# Patient Record
Sex: Female | Born: 1960
Health system: Southern US, Community
[De-identification: ages and names within clinical notes are randomized; demographics above are authoritative.]

## PROBLEM LIST (undated history)

## (undated) DIAGNOSIS — F329 Major depressive disorder, single episode, unspecified: Secondary | ICD-10-CM

## (undated) DIAGNOSIS — F32A Depression, unspecified: Secondary | ICD-10-CM

## (undated) DIAGNOSIS — Z9889 Other specified postprocedural states: Secondary | ICD-10-CM

## (undated) DIAGNOSIS — E785 Hyperlipidemia, unspecified: Secondary | ICD-10-CM

## (undated) DIAGNOSIS — M1990 Unspecified osteoarthritis, unspecified site: Secondary | ICD-10-CM

## (undated) DIAGNOSIS — R7303 Prediabetes: Secondary | ICD-10-CM

## (undated) DIAGNOSIS — F419 Anxiety disorder, unspecified: Secondary | ICD-10-CM

## (undated) DIAGNOSIS — R112 Nausea with vomiting, unspecified: Secondary | ICD-10-CM

## (undated) HISTORY — DX: Major depressive disorder, single episode, unspecified: F32.9

## (undated) HISTORY — DX: Depression, unspecified: F32.A

## (undated) HISTORY — DX: Anxiety disorder, unspecified: F41.9

---

## 1993-02-06 HISTORY — PX: TUBAL LIGATION: SHX77

## 2014-10-06 DIAGNOSIS — F329 Major depressive disorder, single episode, unspecified: Secondary | ICD-10-CM | POA: Insufficient documentation

## 2014-10-06 DIAGNOSIS — F32A Depression, unspecified: Secondary | ICD-10-CM | POA: Insufficient documentation

## 2014-10-06 DIAGNOSIS — F419 Anxiety disorder, unspecified: Secondary | ICD-10-CM | POA: Insufficient documentation

## 2014-10-08 ENCOUNTER — Encounter: Payer: Self-pay | Admitting: Family Medicine

## 2014-10-08 ENCOUNTER — Ambulatory Visit (INDEPENDENT_AMBULATORY_CARE_PROVIDER_SITE_OTHER): Payer: BLUE CROSS/BLUE SHIELD | Admitting: Family Medicine

## 2014-10-08 VITALS — BP 119/78 | HR 62 | Temp 97.7°F | Ht 63.0 in | Wt 215.0 lb

## 2014-10-08 DIAGNOSIS — F32A Depression, unspecified: Secondary | ICD-10-CM

## 2014-10-08 DIAGNOSIS — F329 Major depressive disorder, single episode, unspecified: Secondary | ICD-10-CM | POA: Diagnosis not present

## 2014-10-08 MED ORDER — VENLAFAXINE HCL 75 MG PO TABS: 75.0000 mg | ORAL_TABLET | Freq: Two times a day (BID) | ORAL | Status: DC

## 2014-10-08 NOTE — Assessment & Plan Note (Signed)
The current medical regimen is effective;  continue present plan and medications.  

## 2014-10-08 NOTE — Progress Notes (Signed)
   BP 119/78 mmHg  Pulse 62  Temp(Src) 97.7 F (36.5 C)  Ht  (1.6 m)  Wt 215 lb (97.523 kg)  BMI 38.09 kg/m2  SpO2 96%   Subjective:    Patient ID: Jill Burch, female    DOB: 10/13/1960, 54 y.o.   MRN: [id number]  HPI: Jill Burch is a 54 y.o. female  Chief Complaint  Patient presents with  . Depression   patient follow-up doing well no complaints with medications.  not having any side effects takes faithfully 2 a day.  Relevant past medical, surgical, family and social history reviewed and updated as indicated. Interim medical history since our last visit reviewed. Allergies and medications reviewed and updated.  Review of Systems  Constitutional: Negative.   Respiratory: Negative.   Cardiovascular: Negative.     Per HPI unless specifically indicated above     Objective:    BP 119/78 mmHg  Pulse 62  Temp(Src) 97.7 F (36.5 C)  Ht  (1.6 m)  Wt 215 lb (97.523 kg)  BMI 38.09 kg/m2  SpO2 96%  Wt Readings from Last 3 Encounters:  10/08/14 215 lb (97.523 kg)  05/07/14 204 lb (92.534 kg)    Physical Exam  Constitutional: She is oriented to person, place, and time. She appears well-developed and well-nourished. No distress.  HENT:  Head: Normocephalic and atraumatic.  Right Ear: Hearing normal.  Left Ear: Hearing normal.  Nose: Nose normal.  Eyes: Conjunctivae and lids are normal. Right eye exhibits no discharge. Left eye exhibits no discharge. No scleral icterus.  Cardiovascular: Normal rate, regular rhythm and normal heart sounds.   Pulmonary/Chest: Effort normal and breath sounds normal. No respiratory distress.  Musculoskeletal: Normal range of motion.  Neurological: She is alert and oriented to person, place, and time.  Skin: Skin is intact. No rash noted.  Psychiatric: She has a normal mood and affect. Her speech is normal and behavior is normal. Judgment and thought content normal. Cognition and memory are normal.    No results found for this or  any previous visit.    Assessment & Plan:   Problem List Items Addressed This Visit      Other   Depression - Primary    The current medical regimen is effective;  continue present plan and medications.       Relevant Medications   venlafaxine (EFFEXOR) 75 MG tablet       Follow up plan: Return in about 6 months (around 04/07/2015), or if symptoms worsen or fail to improve, for Physical Exam.

## 2015-03-30 ENCOUNTER — Encounter: Payer: Self-pay | Admitting: Family Medicine

## 2015-05-13 ENCOUNTER — Encounter: Payer: BLUE CROSS/BLUE SHIELD | Admitting: Family Medicine

## 2015-07-08 ENCOUNTER — Encounter: Payer: Self-pay | Admitting: Family Medicine

## 2015-07-08 ENCOUNTER — Telehealth: Payer: Self-pay | Admitting: Family Medicine

## 2015-07-08 NOTE — Telephone Encounter (Signed)
Pt called to cancel CPE @ 10am this morning.  I advised her no medications could be refilled until she had an appointment.  She refused appts I offered due to time constraints and scheduled for August.  I again let her know she may not get any refills until that date.

## 2015-08-08 ENCOUNTER — Encounter: Payer: Self-pay | Admitting: Emergency Medicine

## 2015-08-08 ENCOUNTER — Emergency Department: Admission: EM | Admit: 2015-08-08 | Discharge: 2015-08-08 | Discharge status: Absent without leave | Payer: Self-pay

## 2015-08-08 ENCOUNTER — Emergency Department
Admission: EM | Admit: 2015-08-08 | Discharge: 2015-08-08 | Disposition: A | Discharge status: Home / Self Care | Payer: 59 | Attending: Emergency Medicine | Admitting: Emergency Medicine

## 2015-08-08 ENCOUNTER — Emergency Department: Payer: 59

## 2015-08-08 DIAGNOSIS — F329 Major depressive disorder, single episode, unspecified: Secondary | ICD-10-CM | POA: Diagnosis not present

## 2015-08-08 DIAGNOSIS — M93262 Osteochondritis dissecans, left knee: Secondary | ICD-10-CM | POA: Insufficient documentation

## 2015-08-08 DIAGNOSIS — Z760 Encounter for issue of repeat prescription: Secondary | ICD-10-CM | POA: Diagnosis present

## 2015-08-08 DIAGNOSIS — R52 Pain, unspecified: Secondary | ICD-10-CM

## 2015-08-08 DIAGNOSIS — Z87891 Personal history of nicotine dependence: Secondary | ICD-10-CM | POA: Diagnosis not present

## 2015-08-08 MED ORDER — HYDROCODONE-ACETAMINOPHEN 5-325 MG PO TABS: 1.0000 | ORAL_TABLET | ORAL | Status: DC | PRN

## 2015-08-08 MED ORDER — VENLAFAXINE HCL ER 75 MG PO CP24: 150.0000 mg | ORAL_CAPSULE | Freq: Every day | ORAL | Status: DC

## 2015-08-08 MED ORDER — MELOXICAM 15 MG PO TABS: 15.0000 mg | ORAL_TABLET | Freq: Every day | ORAL | Status: DC

## 2015-08-08 NOTE — ED Provider Notes (Signed)
[facility] Emergency Department Provider Note ____________________________________________  Time seen: Approximately 3:09 PM  I have reviewed the triage vital signs and the nursing notes.   HISTORY  Chief Complaint Medication Refill    HPI Jill Burch is a 55 y.o. female who presents to the emergency department for evaluation of left knee pain and for medication refill. Knee has been hurting for about a week without specific injury. She states she feels like it "wants to pop or give out." She has not taken anything for pain. Pain is worse while standing/walking.  Past Medical History  Diagnosis Date  . Anxiety   . Depression     Patient Active Problem List   Diagnosis Date Noted  . Anxiety   . Depression     Past Surgical History  Procedure Laterality Date  . Tubal ligation      Current Outpatient Rx  Name  Route  Sig  Dispense  Refill  . HYDROcodone-acetaminophen (NORCO/VICODIN) 5-325 MG tablet   Oral   Take 1 tablet by mouth every 4 (four) hours as needed.   12 tablet   0   . meloxicam (MOBIC) 15 MG tablet   Oral   Take 1 tablet (15 mg total) by mouth daily.   30 tablet   0   . venlafaxine XR (EFFEXOR XR) 75 MG 24 hr capsule   Oral   Take 2 capsules (150 mg total) by mouth daily.   30 capsule   0     Allergies Review of patient's allergies indicates no known allergies.  Family History  Problem Relation Age of Onset  . Depression Mother   . Arthritis Mother   . Cancer Father   . Diabetes Father   . Depression Father     Social History Social History  Substance Use Topics  . Smoking status: Former Smoker    Types: Cigarettes    Quit date: 02/06/1990  . Smokeless tobacco: Never Used  . Alcohol Use: No    Review of Systems Constitutional: No recent illness. Cardiovascular: Denies chest pain or palpitations. Respiratory: Denies shortness of breath. Musculoskeletal: Pain in Left knee Skin: Negative for rash, wound,  lesion. Neurological: Negative for focal weakness or numbness.  ____________________________________________   PHYSICAL EXAM:  VITAL SIGNS: ED Triage Vitals  Enc Vitals Group     BP 08/08/15 1348 119/74 mmHg     Pulse Rate 08/08/15 1348 71     Resp 08/08/15 1348 16     Temp 08/08/15 1348 97.8 F (36.6 C)     Temp Source 08/08/15 1348 Oral     SpO2 08/08/15 1348 97 %     Weight 08/08/15 1348 235 lb (106.595 kg)     Height 08/08/15 1348 5\' 3"  (1.6 m)     Head Cir --      Peak Flow --      Pain Score 08/08/15 1346 0     Pain Loc --      Pain Edu? --      Excl. in GC? --     Constitutional: Alert and oriented. Well appearing and in no acute distress. Eyes: Conjunctivae are normal. EOMI. Head: Atraumatic. Neck: No stridor.  Respiratory: Normal respiratory effort.   Musculoskeletal: Diffuse tenderness over the anterior aspect of the left knee. No focal tenderness on exam. Ambulation without assistance or limp observed. Neurologic:  Normal speech and language. No gross focal neurologic deficits are appreciated. Speech is normal. No gait instability. Skin:  Skin  is warm, dry and intact. Atraumatic. Psychiatric: Mood and affect are normal. Speech and behavior are normal.  ____________________________________________   LABS (all labs ordered are listed, but only abnormal results are displayed)  Labs Reviewed - No data to display ____________________________________________  RADIOLOGY  Under surface patellar osteochondral lesion. Moderate patellofemoral and medial compartment osteoarthritis. ____________________________________________   PROCEDURES  Procedure(s) performed:  Knee immobilizer applied by ER tech. Neurovascularly intact post-application.   ____________________________________________   INITIAL IMPRESSION / ASSESSMENT AND PLAN / ED COURSE  Pertinent labs & imaging results that were available during my care of the patient were reviewed by me and  considered in my medical decision making (see chart for details).  Patient was advised to call and schedule a follow-up appointment with orthopedics. She was given a refill of her Effexor as well as prescriptions for meloxicam 15 mg and Norco 5-3 25 mg. She was advised to return to the emergency department for symptoms that change or worsen if she is unable to schedule an appointment with her primary care provider or at the orthopedist. ____________________________________________   FINAL CLINICAL IMPRESSION(S) / ED DIAGNOSES  Final diagnoses:  Encounter for medication refill  Osteochondritis dissecans of knee, left       Chinita PesterCari B Finlee Milo, FNP 08/08/15 1924  Sharyn CreamerMark Quale, MD 08/08/15 2358

## 2015-08-08 NOTE — ED Notes (Signed)
Pt is out of refills of her antidepressant Venlafaxine HCL 75mg  tablet. Pt missed one appt and provider missed an appt and now patient is out of her medication which she states "keeps me calm" Pt took last tablet this morning and does not have enough medications for a second dose.  Next appt with provider is 8/3.

## 2015-08-08 NOTE — ED Notes (Signed)
Pt verbalized understanding of discharge instructions. NAD at this time. 

## 2015-08-08 NOTE — ED Notes (Signed)
Patient states she is out of her medication, Venlafaxine HCL 75 mg BID.   Medication last taken today.  Patient has appointment with PCP on 09/09/2015.

## 2015-09-09 ENCOUNTER — Ambulatory Visit (INDEPENDENT_AMBULATORY_CARE_PROVIDER_SITE_OTHER): Payer: 59 | Admitting: Family Medicine

## 2015-09-09 ENCOUNTER — Encounter: Payer: Self-pay | Admitting: Family Medicine

## 2015-09-09 DIAGNOSIS — F329 Major depressive disorder, single episode, unspecified: Secondary | ICD-10-CM | POA: Diagnosis not present

## 2015-09-09 DIAGNOSIS — F32A Depression, unspecified: Secondary | ICD-10-CM

## 2015-09-09 MED ORDER — VENLAFAXINE HCL ER 150 MG PO CP24: 150.0000 mg | ORAL_CAPSULE | Freq: Every day | ORAL | 3 refills | Status: DC

## 2015-09-09 MED ORDER — VENLAFAXINE HCL ER 75 MG PO CP24: 150.0000 mg | ORAL_CAPSULE | Freq: Every day | ORAL | 6 refills | Status: DC

## 2015-09-09 MED ORDER — MELOXICAM 15 MG PO TABS: 15.0000 mg | ORAL_TABLET | Freq: Every day | ORAL | 3 refills | Status: DC

## 2015-09-09 NOTE — Assessment & Plan Note (Signed)
Impression exacerbation off medications will restart gave refills for a year

## 2015-09-09 NOTE — Progress Notes (Signed)
   BP (!) 143/86 (BP Location: Left Arm, Patient Position: Sitting, Cuff Size: Normal)   Pulse 68   Temp 97.9 F (36.6 C)   Ht 5' 2.1" (1.577 m)   Wt 229 lb (103.9 kg)   BMI 41.75 kg/m    Subjective:    Patient ID: Jill Burch, female    DOB: 1960-04-22, 55 y.o.   MRN: [id number]  HPI: Jill Burch is a 55 y.o. female  Chief Complaint  Patient presents with  . Anxiety  Patient has run out of her Effexor needs refills very concerned. When on Effexor does well with good control of her nerves.  Patient also had her ribs bruise by her dog 3 days ago now's sore but getting somewhat better no bruising  Relevant past medical, surgical, family and social history reviewed and updated as indicated. Interim medical history since our last visit reviewed. Allergies and medications reviewed and updated.  Review of Systems  Constitutional: Negative.   Respiratory: Negative.   Cardiovascular: Negative.     Per HPI unless specifically indicated above     Objective:    BP (!) 143/86 (BP Location: Left Arm, Patient Position: Sitting, Cuff Size: Normal)   Pulse 68   Temp 97.9 F (36.6 C)   Ht 5' 2.1" (1.577 m)   Wt 229 lb (103.9 kg)   BMI 41.75 kg/m   Wt Readings from Last 3 Encounters:  09/09/15 229 lb (103.9 kg)  08/08/15 235 lb (106.6 kg)  10/08/14 215 lb (97.5 kg)    Physical Exam  Constitutional: She is oriented to person, place, and time. She appears well-developed and well-nourished. No distress.  HENT:  Head: Normocephalic and atraumatic.  Right Ear: Hearing normal.  Left Ear: Hearing normal.  Nose: Nose normal.  Eyes: Conjunctivae and lids are normal. Right eye exhibits no discharge. Left eye exhibits no discharge. No scleral icterus.  Cardiovascular: Normal rate, regular rhythm and normal heart sounds.   Pulmonary/Chest: Effort normal and breath sounds normal. No respiratory distress.  Musculoskeletal: Normal range of motion.  Rib and lung area clear just some  superficial tenderness  Neurological: She is alert and oriented to person, place, and time.  Skin: Skin is intact. No rash noted.  Psychiatric: She has a normal mood and affect. Her speech is normal and behavior is normal. Judgment and thought content normal. Cognition and memory are normal.    No results found for this or any previous visit.    Assessment & Plan:   Problem List Items Addressed This Visit      Other   Depression    Impression exacerbation off medications will restart gave refills for a year      Relevant Medications   venlafaxine XR (EFFEXOR-XR) 150 MG 24 hr capsule    Other Visit Diagnoses   None.     Rib abdomen contusion will observe Gave refills meloxicam for knee arthritis Follow up plan: Return for Physical Exam with Roosvelt Maser.

## 2015-10-14 ENCOUNTER — Encounter: Payer: 59 | Admitting: Family Medicine

## 2015-10-15 ENCOUNTER — Encounter: Payer: 59 | Admitting: Family Medicine

## 2015-10-29 ENCOUNTER — Encounter: Payer: 59 | Admitting: Family Medicine

## 2015-11-03 ENCOUNTER — Encounter: Payer: Self-pay | Admitting: Family Medicine

## 2015-11-03 ENCOUNTER — Ambulatory Visit (INDEPENDENT_AMBULATORY_CARE_PROVIDER_SITE_OTHER): Payer: 59 | Admitting: Family Medicine

## 2015-11-03 VITALS — BP 124/76 | HR 71 | Temp 98.1°F | Ht 62.5 in | Wt 229.0 lb

## 2015-11-03 DIAGNOSIS — Z1329 Encounter for screening for other suspected endocrine disorder: Secondary | ICD-10-CM

## 2015-11-03 DIAGNOSIS — F329 Major depressive disorder, single episode, unspecified: Secondary | ICD-10-CM

## 2015-11-03 DIAGNOSIS — F419 Anxiety disorder, unspecified: Secondary | ICD-10-CM | POA: Diagnosis not present

## 2015-11-03 DIAGNOSIS — Z131 Encounter for screening for diabetes mellitus: Secondary | ICD-10-CM | POA: Diagnosis not present

## 2015-11-03 DIAGNOSIS — Z13220 Encounter for screening for lipoid disorders: Secondary | ICD-10-CM | POA: Diagnosis not present

## 2015-11-03 DIAGNOSIS — Z0000 Encounter for general adult medical examination without abnormal findings: Secondary | ICD-10-CM

## 2015-11-03 DIAGNOSIS — F32A Depression, unspecified: Secondary | ICD-10-CM

## 2015-11-03 LAB — UA/M W/RFLX CULTURE, ROUTINE
Bilirubin, UA: NEGATIVE
GLUCOSE, UA: NEGATIVE
Ketones, UA: NEGATIVE
Leukocytes, UA: NEGATIVE
Nitrite, UA: NEGATIVE
PH UA: 7 (ref 5.0–7.5)
PROTEIN UA: NEGATIVE
RBC, UA: NEGATIVE
Specific Gravity, UA: 1.015 (ref 1.005–1.030)
Urobilinogen, Ur: 0.2 mg/dL (ref 0.2–1.0)

## 2015-11-03 MED ORDER — VENLAFAXINE HCL ER 150 MG PO CP24: 150.0000 mg | ORAL_CAPSULE | Freq: Every day | ORAL | 11 refills | Status: DC

## 2015-11-03 MED ORDER — MELOXICAM 15 MG PO TABS: 15.0000 mg | ORAL_TABLET | Freq: Every day | ORAL | 11 refills | Status: DC

## 2015-11-03 NOTE — Progress Notes (Signed)
BP 124/76   Pulse 71   Temp 98.1 F (36.7 C)   Ht 5' 2.5" (1.588 m)   Wt 229 lb (103.9 kg)   SpO2 95%   BMI 41.22 kg/m    Subjective:    Patient ID: Jill Burch, female    DOB: 02/25/1960, 55 y.o.   MRN: [id number]  HPI: Jill Burch is a 55 y.o. female presenting on 11/03/2015 for comprehensive medical examination. Current medical complaints include:none  She currently lives with: Menopausal Symptoms: yes, taking the [employer] version of natural supplement for hot flashes (unsure of name) with some relief.   Depression Screen done today and results listed below:  [facility] 11/03/2015 09/09/2015  Decreased Interest 0 2  Down, Depressed, Hopeless 0 3  PHQ - 2 Score 0 5  Altered sleeping - 3  Tired, decreased energy - 3  Change in appetite - 3  Feeling bad or failure about yourself  - 2  Trouble concentrating - 2  Moving slowly or fidgety/restless - 0  Suicidal thoughts - 0  PHQ-9 Score - 18    The patient does not have a history of falls. I did not complete a risk assessment for falls. A plan of care for falls was not documented.   Past Medical History:  Past Medical History:  Diagnosis Date  . Anxiety   . Depression     Surgical History:  Past Surgical History:  Procedure Laterality Date  . TUBAL LIGATION      Medications:  No current outpatient prescriptions on file prior to visit.   No current facility-administered medications on file prior to visit.     Allergies:  No Known Allergies  Social History:  Social History   Social History  . Marital status: Married    Spouse name: N/A  . Number of children: N/A  . Years of education: N/A   Occupational History  . Not on file.   Social History Main Topics  . Smoking status: Former Smoker    Types: Cigarettes    Quit date: 02/06/1990  . Smokeless tobacco: Never Used  . Alcohol use No  . Drug use: No  . Sexual activity: Not on file   Other Topics Concern  . Not on file   Social History  Narrative  . No narrative on file   History  Smoking Status  . Former Smoker  . Types: Cigarettes  . Quit date: 02/06/1990  Smokeless Tobacco  . Never Used   History  Alcohol Use No    Family History:  Family History  Problem Relation Age of Onset  . Depression Mother   . Arthritis Mother   . Heart disease Mother   . Heart attack Mother   . Cancer Father     bladder  . Diabetes Father   . Depression Father   . Hypertension Father   . Diabetes Brother   . COPD Neg Hx   . Stroke Neg Hx     Past medical history, surgical history, medications, allergies, family history and social history reviewed with patient today and changes made to appropriate areas of the chart.   Review of Systems - General ROS: negative Psychological ROS: negative ENT ROS: negative Endocrine ROS: negative Breast ROS: negative for breast lumps Respiratory ROS: no cough, shortness of breath, or wheezing Cardiovascular ROS: no chest pain or dyspnea on exertion Gastrointestinal ROS: no abdominal pain, change in bowel habits, or black or bloody stools Genito-Urinary ROS: no dysuria, trouble  voiding, or hematuria Musculoskeletal ROS: positive for - knee pain, chronic Neurological ROS: no TIA or stroke symptoms Dermatological ROS: negative All other ROS negative except what is listed above and in the HPI.      Objective:    BP 124/76   Pulse 71   Temp 98.1 F (36.7 C)   Ht 5' 2.5" (1.588 m)   Wt 229 lb (103.9 kg)   SpO2 95%   BMI 41.22 kg/m   Wt Readings from Last 3 Encounters:  11/03/15 229 lb (103.9 kg)  09/09/15 229 lb (103.9 kg)  08/08/15 235 lb (106.6 kg)    Physical Exam  Constitutional: She is oriented to person, place, and time. She appears well-developed and well-nourished. No distress.  HENT:  Head: Atraumatic.  Right Ear: External ear normal.  Left Ear: External ear normal.  Nose: Nose normal.  Mouth/Throat: Oropharynx is clear and moist. No oropharyngeal exudate.  Eyes:  Conjunctivae are normal. Pupils are equal, round, and reactive to light. No scleral icterus.  Neck: Normal range of motion. Neck supple. No tracheal deviation present. No thyromegaly present.  Cardiovascular: Normal rate, regular rhythm and normal heart sounds.   Pulmonary/Chest: Effort normal and breath sounds normal. No respiratory distress.  Abdominal: Soft. Bowel sounds are normal. She exhibits no distension. There is no tenderness.  Musculoskeletal: Normal range of motion. She exhibits no edema.  Lymphadenopathy:    She has no cervical adenopathy.  Neurological: She is alert and oriented to person, place, and time.  Skin: Skin is warm and dry. No rash noted.  Psychiatric: She has a normal mood and affect. Her behavior is normal.  Nursing note and vitals reviewed.   Results for orders placed or performed in visit on 11/03/15  UA/M w/rflx Culture, Routine  Result Value Ref Range   Specific Gravity, UA 1.015 1.005 - 1.030   pH, UA 7.0 5.0 - 7.5   Color, UA Yellow Yellow   Appearance Ur Clear Clear   Leukocytes, UA Negative Negative   Protein, UA Negative Negative/Trace   Glucose, UA Negative Negative   Ketones, UA Negative Negative   RBC, UA Negative Negative   Bilirubin, UA Negative Negative   Urobilinogen, Ur 0.2 0.2 - 1.0 mg/dL   Nitrite, UA Negative Negative      Assessment & Plan:   Problem List Items Addressed This Visit      Other   Anxiety    Doing well with current regimen, continue      Relevant Medications   venlafaxine XR (EFFEXOR-XR) 150 MG 24 hr capsule   Depression    Well controlled on effexor, continue current regimen.       Relevant Medications   venlafaxine XR (EFFEXOR-XR) 150 MG 24 hr capsule    Other Visit Diagnoses    Annual physical exam    -  Primary   Basic labs drawn, await results and follow up as needed depending on results.    Relevant Orders   CBC with Differential/Platelet   Comprehensive metabolic panel   UA/M w/rflx Culture,  Routine (Completed)   Screening for hyperlipidemia       Relevant Orders   Lipid Panel w/o Chol/HDL Ratio   Screening for diabetes mellitus       Relevant Orders   HgB A1c   Screening for thyroid disorder       Relevant Orders   TSH       Follow up plan: Return in about 1 year (around 11/02/2016) for  Physical Exam.   LABORATORY TESTING:  - Pap smear: up to date  IMMUNIZATIONS:   - Tdap: Tetanus vaccination status reviewed: last tetanus booster within 10 years. - Influenza: Up to date  SCREENING: -Mammogram: Refused  - Colonoscopy: Refused   PATIENT COUNSELING:   Advised to take 1 mg of folate supplement per day if capable of pregnancy.   Sexuality: Discussed sexually transmitted diseases, partner selection, use of condoms, avoidance of unintended pregnancy  and contraceptive alternatives.   Advised to avoid cigarette smoking.  I discussed with the patient that most people either abstain from alcohol or drink within safe limits (<=14/week and <=4 drinks/occasion for males, <=7/weeks and <= 3 drinks/occasion for females) and that the risk for alcohol disorders and other health effects rises proportionally with the number of drinks per week and how often a drinker exceeds daily limits.  Discussed cessation/primary prevention of drug use and availability of treatment for abuse.   Diet: Encouraged to adjust caloric intake to maintain  or achieve ideal body weight, to reduce intake of dietary saturated fat and total fat, to limit sodium intake by avoiding high sodium foods and not adding table salt, and to maintain adequate dietary potassium and calcium preferably from fresh fruits, vegetables, and low-fat dairy products.    stressed the importance of regular exercise  Injury prevention: Discussed safety belts, safety helmets, smoke detector, smoking near bedding or upholstery.   Dental health: Discussed importance of regular tooth brushing, flossing, and dental visits.     NEXT PREVENTATIVE PHYSICAL DUE IN 1 YEAR. Return in about 1 year (around 11/02/2016) for Physical Exam.

## 2015-11-03 NOTE — Patient Instructions (Signed)
Follow up as needed

## 2015-11-03 NOTE — Assessment & Plan Note (Signed)
Well controlled on effexor, continue current regimen.

## 2015-11-03 NOTE — Assessment & Plan Note (Signed)
Doing well with current regimen, continue

## 2015-11-04 LAB — CBC WITH DIFFERENTIAL/PLATELET
BASOS ABS: 0 10*3/uL (ref 0.0–0.2)
Basos: 0 %
EOS (ABSOLUTE): 0.1 10*3/uL (ref 0.0–0.4)
EOS: 2 %
Hematocrit: 36.7 % (ref 34.0–46.6)
Hemoglobin: 12 g/dL (ref 11.1–15.9)
IMMATURE GRANS (ABS): 0 10*3/uL (ref 0.0–0.1)
IMMATURE GRANULOCYTES: 0 %
Lymphocytes Absolute: 1.8 10*3/uL (ref 0.7–3.1)
Lymphs: 27 %
MCH: 28.7 pg (ref 26.6–33.0)
MCHC: 32.7 g/dL (ref 31.5–35.7)
MCV: 88 fL (ref 79–97)
MONOCYTES: 6 %
Monocytes Absolute: 0.4 10*3/uL (ref 0.1–0.9)
NEUTROS ABS: 4.4 10*3/uL (ref 1.4–7.0)
Neutrophils: 65 %
PLATELETS: 304 10*3/uL (ref 150–379)
RBC: 4.18 x10E6/uL (ref 3.77–5.28)
RDW: 14.7 % (ref 12.3–15.4)
WBC: 6.9 10*3/uL (ref 3.4–10.8)

## 2015-11-04 LAB — HEMOGLOBIN A1C
Est. average glucose Bld gHb Est-mCnc: 128 mg/dL
HEMOGLOBIN A1C: 6.1 % — AB (ref 4.8–5.6)

## 2015-11-04 LAB — COMPREHENSIVE METABOLIC PANEL
ALT: 25 IU/L (ref 0–32)
AST: 23 IU/L (ref 0–40)
Albumin/Globulin Ratio: 1.7 (ref 1.2–2.2)
Albumin: 4.3 g/dL (ref 3.5–5.5)
Alkaline Phosphatase: 76 IU/L (ref 39–117)
BILIRUBIN TOTAL: 0.3 mg/dL (ref 0.0–1.2)
BUN/Creatinine Ratio: 25 — ABNORMAL HIGH (ref 9–23)
BUN: 19 mg/dL (ref 6–24)
CHLORIDE: 99 mmol/L (ref 96–106)
CO2: 27 mmol/L (ref 18–29)
Calcium: 9.5 mg/dL (ref 8.7–10.2)
Creatinine, Ser: 0.77 mg/dL (ref 0.57–1.00)
GFR calc non Af Amer: 86 mL/min/{1.73_m2} (ref >59)
GFR, EST AFRICAN AMERICAN: 99 mL/min/{1.73_m2} (ref >59)
GLUCOSE: 97 mg/dL (ref 65–99)
Globulin, Total: 2.5 g/dL (ref 1.5–4.5)
Potassium: 4.3 mmol/L (ref 3.5–5.2)
Sodium: 142 mmol/L (ref 134–144)
TOTAL PROTEIN: 6.8 g/dL (ref 6.0–8.5)

## 2015-11-04 LAB — LIPID PANEL W/O CHOL/HDL RATIO
CHOLESTEROL TOTAL: 214 mg/dL — AB (ref 100–199)
HDL: 55 mg/dL (ref >39)
LDL Calculated: 138 mg/dL — ABNORMAL HIGH (ref 0–99)
TRIGLYCERIDES: 107 mg/dL (ref 0–149)
VLDL CHOLESTEROL CAL: 21 mg/dL (ref 5–40)

## 2015-11-04 LAB — TSH: TSH: 2.66 u[IU]/mL (ref 0.450–4.500)

## 2015-11-09 ENCOUNTER — Telehealth: Payer: Self-pay | Admitting: Family Medicine

## 2015-11-09 NOTE — Telephone Encounter (Signed)
Patient notified

## 2015-11-09 NOTE — Telephone Encounter (Signed)
Please call patient and let her know that all of her labs came back normal except her A1C was slightly high, placing her in the "prediabetes" category, and her bad cholesterol was a touch high. These can be improved with good diet and exercise modifications.

## 2015-12-06 ENCOUNTER — Telehealth: Payer: Self-pay | Admitting: Family Medicine

## 2015-12-06 NOTE — Telephone Encounter (Signed)
Pt would like discuss having a L knee replacement.

## 2015-12-06 NOTE — Telephone Encounter (Signed)
Patient notified

## 2015-12-06 NOTE — Telephone Encounter (Signed)
Please let patient know that Dr. Dossie Arbourrissman is out of the office and will call her about this when he returns. Thanks.

## 2015-12-10 ENCOUNTER — Ambulatory Visit: Payer: 59 | Admitting: Family Medicine

## 2015-12-13 NOTE — Telephone Encounter (Signed)
Call pt 

## 2016-01-13 ENCOUNTER — Ambulatory Visit (INDEPENDENT_AMBULATORY_CARE_PROVIDER_SITE_OTHER): Payer: 59 | Admitting: Family Medicine

## 2016-01-13 ENCOUNTER — Encounter: Payer: Self-pay | Admitting: Family Medicine

## 2016-01-13 VITALS — BP 113/75 | HR 75 | Temp 98.5°F | Wt 228.4 lb

## 2016-01-13 DIAGNOSIS — J019 Acute sinusitis, unspecified: Secondary | ICD-10-CM | POA: Diagnosis not present

## 2016-01-13 MED ORDER — AZITHROMYCIN 250 MG PO TABS: ORAL_TABLET | ORAL | 0 refills | Status: DC

## 2016-01-13 MED ORDER — BENZONATATE 100 MG PO CAPS: 100.0000 mg | ORAL_CAPSULE | Freq: Two times a day (BID) | ORAL | 0 refills | Status: DC | PRN

## 2016-01-13 NOTE — Progress Notes (Signed)
BP 113/75 (BP Location: Left Arm, Patient Position: Sitting, Cuff Size: Large)   Pulse 75   Temp 98.5 F (36.9 C)   Wt 228 lb 6.4 oz (103.6 kg)   SpO2 94%   BMI 41.11 kg/m    Subjective:    Patient ID: Jill Burch, female    DOB: 01/02/1961, 55 y.o.   MRN: [id number]  HPI: Jill Burch is a 55 y.o. female  Chief Complaint  Patient presents with  . URI    pt states she has had congestion, drainage, sore throat, yellow phlegm, and sinus pressure. States symtpoms started 3 days ago.    Patient on reviews symptoms of been going on for over a week with some fever or chills aches and pains also coughing more sinus pressure when bending over sloshing-type sensation and sinus headache with some sore teeth Has tried over-the-counter medicines without relief Relevant past medical, surgical, family and social history reviewed and updated as indicated. Interim medical history since our last visit reviewed. Allergies and medications reviewed and updated.  Review of Systems  Constitutional: Positive for chills, diaphoresis, fatigue and fever.  HENT: Positive for congestion, sinus pain, sinus pressure, sneezing and sore throat.   Respiratory: Positive for cough.   Cardiovascular: Negative.     Per HPI unless specifically indicated above     Objective:    BP 113/75 (BP Location: Left Arm, Patient Position: Sitting, Cuff Size: Large)   Pulse 75   Temp 98.5 F (36.9 C)   Wt 228 lb 6.4 oz (103.6 kg)   SpO2 94%   BMI 41.11 kg/m   Wt Readings from Last 3 Encounters:  01/13/16 228 lb 6.4 oz (103.6 kg)  11/03/15 229 lb (103.9 kg)  09/09/15 229 lb (103.9 kg)    Physical Exam  Constitutional: She is oriented to person, place, and time. She appears well-developed and well-nourished. No distress.  HENT:  Head: Normocephalic and atraumatic.  Right Ear: Hearing and external ear normal.  Left Ear: Hearing and external ear normal.  Nose: Nose normal.  Mouth/Throat: Oropharyngeal exudate  present.  Eyes: Conjunctivae and lids are normal. Right eye exhibits no discharge. Left eye exhibits no discharge. No scleral icterus.  Cardiovascular: Normal rate, regular rhythm and normal heart sounds.   Pulmonary/Chest: Effort normal and breath sounds normal. No respiratory distress.  Musculoskeletal: Normal range of motion.  Lymphadenopathy:    She has no cervical adenopathy.  Neurological: She is alert and oriented to person, place, and time.  Skin: Skin is intact. No rash noted.  Psychiatric: She has a normal mood and affect. Her speech is normal and behavior is normal. Judgment and thought content normal. Cognition and memory are normal.    Results for orders placed or performed in visit on 11/03/15  CBC with Differential/Platelet  Result Value Ref Range   WBC 6.9 3.4 - 10.8 x10E3/uL   RBC 4.18 3.77 - 5.28 x10E6/uL   Hemoglobin 12.0 11.1 - 15.9 g/dL   Hematocrit 11.936.7 14.734.0 - 46.6 %   MCV 88 79 - 97 fL   MCH 28.7 26.6 - 33.0 pg   MCHC 32.7 31.5 - 35.7 g/dL   RDW 82.914.7 56.212.3 - 13.015.4 %   Platelets 304 150 - 379 x10E3/uL   Neutrophils 65 %   Lymphs 27 %   Monocytes 6 %   Eos 2 %   Basos 0 %   Neutrophils Absolute 4.4 1.4 - 7.0 x10E3/uL   Lymphocytes Absolute 1.8 0.7 - 3.1 x10E3/uL  Monocytes Absolute 0.4 0.1 - 0.9 x10E3/uL   EOS (ABSOLUTE) 0.1 0.0 - 0.4 x10E3/uL   Basophils Absolute 0.0 0.0 - 0.2 x10E3/uL   Immature Granulocytes 0 %   Immature Grans (Abs) 0.0 0.0 - 0.1 x10E3/uL  Comprehensive metabolic panel  Result Value Ref Range   Glucose 97 65 - 99 mg/dL   BUN 19 6 - 24 mg/dL   Creatinine, Ser 5.780.77 0.57 - 1.00 mg/dL   GFR calc non Af Amer 86 >59 mL/min/1.73   GFR calc Af Amer 99 >59 mL/min/1.73   BUN/Creatinine Ratio 25 (H) 9 - 23   Sodium 142 134 - 144 mmol/L   Potassium 4.3 3.5 - 5.2 mmol/L   Chloride 99 96 - 106 mmol/L   CO2 27 18 - 29 mmol/L   Calcium 9.5 8.7 - 10.2 mg/dL   Total Protein 6.8 6.0 - 8.5 g/dL   Albumin 4.3 3.5 - 5.5 g/dL   Globulin, Total 2.5  1.5 - 4.5 g/dL   Albumin/Globulin Ratio 1.7 1.2 - 2.2   Bilirubin Total 0.3 0.0 - 1.2 mg/dL   Alkaline Phosphatase 76 39 - 117 IU/L   AST 23 0 - 40 IU/L   ALT 25 0 - 32 IU/L  Lipid Panel w/o Chol/HDL Ratio  Result Value Ref Range   Cholesterol, Total 214 (H) 100 - 199 mg/dL   Triglycerides 469107 0 - 149 mg/dL   HDL 55 >62>39 mg/dL   VLDL Cholesterol Cal 21 5 - 40 mg/dL   LDL Calculated 952138 (H) 0 - 99 mg/dL  TSH  Result Value Ref Range   TSH 2.660 0.450 - 4.500 uIU/mL  UA/M w/rflx Culture, Routine  Result Value Ref Range   Specific Gravity, UA 1.015 1.005 - 1.030   pH, UA 7.0 5.0 - 7.5   Color, UA Yellow Yellow   Appearance Ur Clear Clear   Leukocytes, UA Negative Negative   Protein, UA Negative Negative/Trace   Glucose, UA Negative Negative   Ketones, UA Negative Negative   RBC, UA Negative Negative   Bilirubin, UA Negative Negative   Urobilinogen, Ur 0.2 0.2 - 1.0 mg/dL   Nitrite, UA Negative Negative  HgB A1c  Result Value Ref Range   Hgb A1c MFr Bld 6.1 (H) 4.8 - 5.6 %   Est. average glucose Bld gHb Est-mCnc 128 mg/dL      Assessment & Plan:   Problem List Items Addressed This Visit    None    Visit Diagnoses    Acute sinusitis, recurrence not specified, unspecified location    -  Primary   Education of Sinusitis care and treatment use of antibiotics use of over-the-counter medications Tylenol Mucinex nasal rinse   Relevant Medications   azithromycin (ZITHROMAX) 250 MG tablet   benzonatate (TESSALON) 100 MG capsule       Follow up plan: Return for As scheduled.

## 2017-01-24 ENCOUNTER — Other Ambulatory Visit: Payer: Self-pay | Admitting: Family Medicine

## 2017-02-26 ENCOUNTER — Encounter: Payer: Self-pay | Admitting: Family Medicine

## 2017-02-26 ENCOUNTER — Ambulatory Visit: Payer: 59 | Admitting: Family Medicine

## 2017-02-26 VITALS — BP 128/85 | HR 77 | Ht 64.17 in | Wt 236.0 lb

## 2017-02-26 DIAGNOSIS — M25562 Pain in left knee: Secondary | ICD-10-CM

## 2017-02-26 DIAGNOSIS — M62830 Muscle spasm of back: Secondary | ICD-10-CM | POA: Diagnosis not present

## 2017-02-26 DIAGNOSIS — I83812 Varicose veins of left lower extremities with pain: Secondary | ICD-10-CM

## 2017-02-26 DIAGNOSIS — Z0000 Encounter for general adult medical examination without abnormal findings: Secondary | ICD-10-CM

## 2017-02-26 DIAGNOSIS — F419 Anxiety disorder, unspecified: Secondary | ICD-10-CM

## 2017-02-26 DIAGNOSIS — Z1159 Encounter for screening for other viral diseases: Secondary | ICD-10-CM

## 2017-02-26 DIAGNOSIS — F329 Major depressive disorder, single episode, unspecified: Secondary | ICD-10-CM

## 2017-02-26 DIAGNOSIS — F32A Depression, unspecified: Secondary | ICD-10-CM

## 2017-02-26 DIAGNOSIS — Z23 Encounter for immunization: Secondary | ICD-10-CM | POA: Diagnosis not present

## 2017-02-26 DIAGNOSIS — Z124 Encounter for screening for malignant neoplasm of cervix: Secondary | ICD-10-CM | POA: Diagnosis not present

## 2017-02-26 DIAGNOSIS — G8929 Other chronic pain: Secondary | ICD-10-CM

## 2017-02-26 DIAGNOSIS — Z114 Encounter for screening for human immunodeficiency virus [HIV]: Secondary | ICD-10-CM

## 2017-02-26 DIAGNOSIS — Z1211 Encounter for screening for malignant neoplasm of colon: Secondary | ICD-10-CM

## 2017-02-26 LAB — UA/M W/RFLX CULTURE, ROUTINE
BILIRUBIN UA: NEGATIVE
GLUCOSE, UA: NEGATIVE
KETONES UA: NEGATIVE
Leukocytes, UA: NEGATIVE
NITRITE UA: NEGATIVE
Protein, UA: NEGATIVE
RBC UA: NEGATIVE
SPEC GRAV UA: 1.025 (ref 1.005–1.030)
UUROB: 0.2 mg/dL (ref 0.2–1.0)
pH, UA: 6 (ref 5.0–7.5)

## 2017-02-26 MED ORDER — MELOXICAM 15 MG PO TABS: 15.0000 mg | ORAL_TABLET | Freq: Every day | ORAL | 11 refills | Status: DC

## 2017-02-26 MED ORDER — DICLOFENAC SODIUM 1 % TD GEL: 2.0000 g | Freq: Four times a day (QID) | TRANSDERMAL | 11 refills | Status: DC

## 2017-02-26 MED ORDER — CYCLOBENZAPRINE HCL 10 MG PO TABS: 10.0000 mg | ORAL_TABLET | Freq: Three times a day (TID) | ORAL | 0 refills | Status: DC | PRN

## 2017-02-26 MED ORDER — VENLAFAXINE HCL ER 150 MG PO CP24: 150.0000 mg | ORAL_CAPSULE | Freq: Every day | ORAL | 3 refills | Status: DC

## 2017-02-26 NOTE — Patient Instructions (Signed)
Td Vaccine (Tetanus and Diphtheria): What You Need to Know 1. Why get vaccinated? Tetanus  and diphtheria are very serious diseases. They are rare in the [place] today, but people who do become infected often have severe complications. Td vaccine is used to protect adolescents and adults from both of these diseases. Both tetanus and diphtheria are infections caused by bacteria. Diphtheria spreads from person to person through coughing or sneezing. Tetanus-causing bacteria enter the body through cuts, scratches, or wounds. TETANUS (lockjaw) causes painful muscle tightening and stiffness, usually all over the body.  It can lead to tightening of muscles in the head and neck so you can't open your mouth, swallow, or sometimes even breathe. Tetanus kills about 1 out of every 10 people who are infected even after receiving the best medical care.  DIPHTHERIA can cause a thick coating to form in the back of the throat.  It can lead to breathing problems, paralysis, heart failure, and death.  Before vaccines, as many as 200,000 cases of diphtheria and hundreds of cases of tetanus were reported in the [place] each year. Since vaccination began, reports of cases for both diseases have dropped by about 99%. 2. Td vaccine Td vaccine can protect adolescents and adults from tetanus and diphtheria. Td is usually given as a booster dose every 10 years but it can also be given earlier after a severe and dirty wound or burn. Another vaccine, called Tdap, which protects against pertussis in addition to tetanus and diphtheria, is sometimes recommended instead of Td vaccine. Your doctor or the person giving you the vaccine can give you more information. Td may safely be given at the same time as other vaccines. 3. Some people should not get this vaccine  A person who has ever had a life-threatening allergic reaction after a previous dose of any tetanus or diphtheria containing vaccine, OR has a severe  allergy to any part of this vaccine, should not get Td vaccine. Tell the person giving the vaccine about any severe allergies.  Talk to your doctor if you: ? had severe pain or swelling after any vaccine containing diphtheria or tetanus, ? ever had a condition called Guillain Barre Syndrome (GBS), ? aren't feeling well on the day the shot is scheduled. 4. What are the risks from Td vaccine? With any medicine, including vaccines, there is a chance of side effects. These are usually mild and go away on their own. Serious reactions are also possible but are rare. Most people who get Td vaccine do not have any problems with it. Mild problems following Td vaccine: (Did not interfere with activities)  Pain where the shot was given (about 8 people in 10)  Redness or swelling where the shot was given (about 1 person in 4)  Mild fever (rare)  Headache (about 1 person in 4)  Tiredness (about 1 person in 4)  Moderate problems following Td vaccine: (Interfered with activities, but did not require medical attention)  Fever over 102F (rare)  Severe problems following Td vaccine: (Unable to perform usual activities; required medical attention)  Swelling, severe pain, bleeding and/or redness in the arm where the shot was given (rare).  Problems that could happen after any vaccine:  People sometimes faint after a medical procedure, including vaccination. Sitting or lying down for about 15 minutes can help prevent fainting, and injuries caused by a fall. Tell your doctor if you feel dizzy, or have vision changes or ringing in the ears.  Some people get   severe pain in the shoulder and have difficulty moving the arm where a shot was given. This happens very rarely.  Any medication can cause a severe allergic reaction. Such reactions from a vaccine are very rare, estimated at fewer than 1 in a million doses, and would happen within a few minutes to a few hours after the vaccination. As with any  medicine, there is a very remote chance of a vaccine causing a serious injury or death. The safety of vaccines is always being monitored. For more information, visit: www.cdc.gov/vaccinesafety/ 5. What if there is a serious reaction? What should I look for? Look for anything that concerns you, such as signs of a severe allergic reaction, very high fever, or unusual behavior. Signs of a severe allergic reaction can include hives, swelling of the face and throat, difficulty breathing, a fast heartbeat, dizziness, and weakness. These would usually start a few minutes to a few hours after the vaccination. What should I do?  If you think it is a severe allergic reaction or other emergency that can't wait, call 9-1-1 or get the person to the nearest hospital. Otherwise, call your doctor.  Afterward, the reaction should be reported to the Vaccine Adverse Event Reporting System (VAERS). Your doctor might file this report, or you can do it yourself through the VAERS web site at www.vaers.hhs.gov, or by calling 1-800-822-7967. ? VAERS does not give medical advice. 6. The National Vaccine Injury Compensation Program The National Vaccine Injury Compensation Program (VICP) is a federal program that was created to compensate people who may have been injured by certain vaccines. Persons who believe they may have been injured by a vaccine can learn about the program and about filing a claim by calling 1-800-338-2382 or visiting the VICP website at www.hrsa.gov/vaccinecompensation. There is a time limit to file a claim for compensation. 7. How can I learn more?  Ask your doctor. He or she can give you the vaccine package insert or suggest other sources of information.  Call your local or state health department.  Contact the Centers for Disease Control and Prevention (CDC): ? Call 1-800-232-4636 (1-800-CDC-INFO) ? Visit CDC's website at www.cdc.gov/vaccines CDC Td Vaccine VIS (05/18/15) This information is  not intended to replace advice given to you by your health care provider. Make sure you discuss any questions you have with your health care provider. Document Released: 11/20/2005 Document Revised: 10/14/2015 Document Reviewed: 10/14/2015 Elsevier Interactive Patient Education  2017 [employer].  

## 2017-02-26 NOTE — Progress Notes (Signed)
BP 128/85   Pulse 77   Ht 5' 4.17" (1.63 m)   Wt 236 lb (107 kg)   SpO2 95%   BMI 40.29 kg/m    Subjective:    Patient ID: Jill Burch, female    DOB: May 02, 1960, 57 y.o.   MRN: [id number]  HPI: Jill Burch is a 57 y.o. female presenting on 02/26/2017 for comprehensive medical examination. Current medical complaints include:see below  Varicose veins L leg worse than R. Worse the past month. States they've been present for a few years but the area on medial left upper leg has become painful, larger, and very bothersome for her. Wanting to go to a specialist.   Left knee arthritis acting up. OTC pain relievers are not providing much relief.   Lots of stress with employer closing down. States the effexor continues to do a good job controlling her moods. Wanting to keep current regimen. No SI/HI.   Also having frequent back spasms. Not trying anything OTC for this. Thinks it's from working on her feet all day.   Does not want regular mammograms - will let us know when she wants to have one done.   Depression Screen done today and results listed below:  Depression screen [facility] 2/9 02/26/2017 11/03/2015 09/09/2015  Decreased Interest 1 0 2  Down, Depressed, Hopeless 1 0 3  PHQ - 2 Score 2 0 5  Altered sleeping 0 - 3  Tired, decreased energy 3 - 3  Change in appetite 3 - 3  Feeling bad or failure about yourself  0 - 2  Trouble concentrating 2 - 2  Moving slowly or fidgety/restless 0 - 0  Suicidal thoughts 0 - 0  PHQ-9 Score 10 - 18    The patient does not have a history of falls. I did not complete a risk assessment for falls. A plan of care for falls was not documented.   Past Medical History:  Past Medical History:  Diagnosis Date  . Anxiety   . Depression     Surgical History:  Past Surgical History:  Procedure Laterality Date  . TUBAL LIGATION      Medications:  Current Outpatient Medications on File Prior to Visit  Medication Sig  . benzonatate (TESSALON) 100 MG  capsule Take 1 capsule (100 mg total) by mouth 2 (two) times daily as needed for cough.   No current facility-administered medications on file prior to visit.     Allergies:  No Known Allergies  Social History:  Social History   Socioeconomic History  . Marital status: Married    Spouse name: Not on file  . Number of children: Not on file  . Years of education: Not on file  . Highest education level: Not on file  Social Needs  . Financial resource strain: Not on file  . Food insecurity - worry: Not on file  . Food insecurity - inability: Not on file  . Transportation needs - medical: Not on file  . Transportation needs - non-medical: Not on file  Occupational History  . Not on file  Tobacco Use  . Smoking status: Former Smoker    Types: Cigarettes    Last attempt to quit: 02/06/1990    Years since quitting: 27.0  . Smokeless tobacco: Never Used  Substance and Sexual Activity  . Alcohol use: No    Alcohol/week: 0.0 oz  . Drug use: No  . Sexual activity: Not on file  Other Topics Concern  . Not on file  Social History Narrative  . Not on file   Social History   Tobacco Use  Smoking Status Former Smoker  . Types: Cigarettes  . Last attempt to quit: 02/06/1990  . Years since quitting: 27.0  Smokeless Tobacco Never Used   Social History   Substance and Sexual Activity  Alcohol Use No  . Alcohol/week: 0.0 oz    Family History:  Family History  Problem Relation Age of Onset  . Depression Mother   . Arthritis Mother   . Heart disease Mother   . Heart attack Mother   . Cancer Father        bladder  . Diabetes Father   . Depression Father   . Hypertension Father   . Diabetes Brother   . COPD Neg Hx   . Stroke Neg Hx     Past medical history, surgical history, medications, allergies, family history and social history reviewed with patient today and changes made to appropriate areas of the chart.   Review of Systems - General ROS: negative Psychological  ROS: positive for - anxiety and depression Ophthalmic ROS: negative ENT ROS: negative Allergy and Immunology ROS: negative Breast ROS: negative for breast lumps Respiratory ROS: no cough, shortness of breath, or wheezing Cardiovascular ROS: no chest pain or dyspnea on exertion varicose veins Gastrointestinal ROS: no abdominal pain, change in bowel habits, or black or bloody stools Genito-Urinary ROS: no dysuria, trouble voiding, or hematuria Musculoskeletal ROS: positive for - joint pain and muscle pain Neurological ROS: no TIA or stroke symptoms Dermatological ROS: negative All other ROS negative except what is listed above and in the HPI.      Objective:    BP 128/85   Pulse 77   Ht 5' 4.17" (1.63 m)   Wt 236 lb (107 kg)   SpO2 95%   BMI 40.29 kg/m   Wt Readings from Last 3 Encounters:  02/26/17 236 lb (107 kg)  01/13/16 228 lb 6.4 oz (103.6 kg)  11/03/15 229 lb (103.9 kg)    Physical Exam  Constitutional: She is oriented to person, place, and time. No distress.  Obese  HENT:  Head: Atraumatic.  Right Ear: External ear normal.  Left Ear: External ear normal.  Nose: Nose normal.  Mouth/Throat: Oropharynx is clear and moist. No oropharyngeal exudate.  Eyes: Conjunctivae are normal. Pupils are equal, round, and reactive to light. No scleral icterus.  Neck: Normal range of motion. Neck supple. No thyromegaly present.  Cardiovascular: Normal rate, regular rhythm, normal heart sounds and intact distal pulses.  Area of significant varicose veins left medial upper leg. Sporadic diffuse smaller varicosities across b/l legs  Pulmonary/Chest: Effort normal and breath sounds normal. No respiratory distress. Right breast exhibits no mass, no skin change and no tenderness. Left breast exhibits no mass, no skin change and no tenderness.  Abdominal: Soft. Bowel sounds are normal. She exhibits no mass. There is no tenderness.  Musculoskeletal: Normal range of motion. She exhibits no  edema or tenderness.  Crepitus of knees b/l to PROM  Lymphadenopathy:    She has no cervical adenopathy.    She has no axillary adenopathy.  Neurological: She is alert and oriented to person, place, and time. No cranial nerve deficit.  Skin: Skin is warm and dry. No rash noted.  Psychiatric: She has a normal mood and affect. Her behavior is normal.  Nursing note and vitals reviewed.   Results for orders placed or performed in visit on 02/26/17  Hepatitis C Antibody  Result Value Ref Range   Hep C Virus Ab <0.1 0.0 - 0.9 s/co ratio  HIV antibody (with reflex)  Result Value Ref Range   HIV Screen 4th Generation wRfx Non Reactive Non Reactive  CBC with Differential/Platelet  Result Value Ref Range   WBC 8.3 3.4 - 10.8 x10E3/uL   RBC 4.38 3.77 - 5.28 x10E6/uL   Hemoglobin 12.7 11.1 - 15.9 g/dL   Hematocrit 16.1 09.6 - 46.6 %   MCV 88 79 - 97 fL   MCH 29.0 26.6 - 33.0 pg   MCHC 32.8 31.5 - 35.7 g/dL   RDW 04.5 40.9 - 81.1 %   Platelets 370 150 - 379 x10E3/uL   Neutrophils 66 Not Estab. %   Lymphs 27 Not Estab. %   Monocytes 5 Not Estab. %   Eos 2 Not Estab. %   Basos 0 Not Estab. %   Neutrophils Absolute 5.5 1.4 - 7.0 x10E3/uL   Lymphocytes Absolute 2.2 0.7 - 3.1 x10E3/uL   Monocytes Absolute 0.4 0.1 - 0.9 x10E3/uL   EOS (ABSOLUTE) 0.1 0.0 - 0.4 x10E3/uL   Basophils Absolute 0.0 0.0 - 0.2 x10E3/uL   Immature Granulocytes 0 Not Estab. %   Immature Grans (Abs) 0.0 0.0 - 0.1 x10E3/uL  Comprehensive metabolic panel  Result Value Ref Range   Glucose 71 65 - 99 mg/dL   BUN 11 6 - 24 mg/dL   Creatinine, Ser 9.14 0.57 - 1.00 mg/dL   GFR calc non Af Amer 93 >59 mL/min/1.73   GFR calc Af Amer 107 >59 mL/min/1.73   BUN/Creatinine Ratio 15 9 - 23   Sodium 145 (H) 134 - 144 mmol/L   Potassium 4.2 3.5 - 5.2 mmol/L   Chloride 103 96 - 106 mmol/L   CO2 29 20 - 29 mmol/L   Calcium 9.5 8.7 - 10.2 mg/dL   Total Protein 7.1 6.0 - 8.5 g/dL   Albumin 4.3 3.5 - 5.5 g/dL   Globulin, Total  2.8 1.5 - 4.5 g/dL   Albumin/Globulin Ratio 1.5 1.2 - 2.2   Bilirubin Total <0.2 0.0 - 1.2 mg/dL   Alkaline Phosphatase 75 39 - 117 IU/L   AST 16 0 - 40 IU/L   ALT 22 0 - 32 IU/L  Lipid panel  Result Value Ref Range   Cholesterol, Total 218 (H) 100 - 199 mg/dL   Triglycerides 782 (H) 0 - 149 mg/dL   HDL 48 >95 mg/dL   VLDL Cholesterol Cal 33 5 - 40 mg/dL   LDL Calculated 621 (H) 0 - 99 mg/dL   Chol/HDL Ratio 4.5 (H) 0.0 - 4.4 ratio  UA/M w/rflx Culture, Routine  Result Value Ref Range   Specific Gravity, UA 1.025 1.005 - 1.030   pH, UA 6.0 5.0 - 7.5   Color, UA Yellow Yellow   Appearance Ur Clear Clear   Leukocytes, UA Negative Negative   Protein, UA Negative Negative/Trace   Glucose, UA Negative Negative   Ketones, UA Negative Negative   RBC, UA Negative Negative   Bilirubin, UA Negative Negative   Urobilinogen, Ur 0.2 0.2 - 1.0 mg/dL   Nitrite, UA Negative Negative  TSH  Result Value Ref Range   TSH 1.980 0.450 - 4.500 uIU/mL  IGP, Aptima HPV, rfx 16/18,45  Result Value Ref Range   DIAGNOSIS: Comment    Specimen adequacy: Comment    Clinician Provided ICD10 Comment    Performed by: Comment    PAP Smear Comment .    Note:  Comment    Test Methodology Comment    HPV Aptima Negative Negative      Assessment & Plan:   Problem List Items Addressed This Visit      Other   Anxiety - Primary    Stable, continue current regimen      Relevant Medications   venlafaxine XR (EFFEXOR-XR) 150 MG 24 hr capsule   Depression    Stable, continue on the effexor      Relevant Medications   venlafaxine XR (EFFEXOR-XR) 150 MG 24 hr capsule    Other Visit Diagnoses    Physical exam       Relevant Orders   IGP, Aptima HPV, rfx 16/18,45 (Completed)   CBC with Differential/Platelet (Completed)   Comprehensive metabolic panel (Completed)   Lipid panel (Completed)   UA/M w/rflx Culture, Routine (Completed)   TSH (Completed)   Chronic pain of left knee       Will try  diclofenac gel, compression sleeve, continue prn meloxicam or tylenol. Discussed importance of weight loss to reduce pressure on joints   Relevant Medications   venlafaxine XR (EFFEXOR-XR) 150 MG 24 hr capsule   cyclobenzaprine (FLEXERIL) 10 MG tablet   meloxicam (MOBIC) 15 MG tablet   Back spasm       Epsom soaks, flexeril prn, massage, muscle rubs. F/u if worsening or no improvement   Varicose veins of left lower extremity with pain       Referral placed to vascular. Discussed compression, increased exercise and weight loss. F/u with specialist for further management   Relevant Medications   atorvastatin (LIPITOR) 10 MG tablet   Need for hepatitis C screening test       Relevant Orders   Hepatitis C Antibody (Completed)   Encounter for screening for HIV       Relevant Orders   HIV antibody (with reflex) (Completed)   Colon cancer screening       Relevant Orders   Ambulatory referral to Gastroenterology   Cervical cancer screening       Relevant Orders   IGP, Aptima HPV, rfx 16/18,45 (Completed)   Need for Tdap vaccination       Need for tetanus booster       Relevant Orders   Td vaccine greater than or equal to 7yo preservative free IM (Completed)       Follow up plan: Return in about 1 year (around 02/26/2018) for CPE.   LABORATORY TESTING:  - Pap smear: pap done  IMMUNIZATIONS:   - Tdap: Tetanus vaccination status reviewed: Td vaccination indicated and given today. - Influenza: Up to date  SCREENING: -Mammogram: Refused  - Colonoscopy: Ordered today   PATIENT COUNSELING:   Advised to take 1 mg of folate supplement per day if capable of pregnancy.   Sexuality: Discussed sexually transmitted diseases, partner selection, use of condoms, avoidance of unintended pregnancy  and contraceptive alternatives.   Advised to avoid cigarette smoking.  I discussed with the patient that most people either abstain from alcohol or drink within safe limits (<=14/week and <=4  drinks/occasion for males, <=7/weeks and <= 3 drinks/occasion for females) and that the risk for alcohol disorders and other health effects rises proportionally with the number of drinks per week and how often a drinker exceeds daily limits.  Discussed cessation/primary prevention of drug use and availability of treatment for abuse.   Diet: Encouraged to adjust caloric intake to maintain  or achieve ideal body weight, to reduce intake of dietary saturated  fat and total fat, to limit sodium intake by avoiding high sodium foods and not adding table salt, and to maintain adequate dietary potassium and calcium preferably from fresh fruits, vegetables, and low-fat dairy products.    stressed the importance of regular exercise  Injury prevention: Discussed safety belts, safety helmets, smoke detector, smoking near bedding or upholstery.   Dental health: Discussed importance of regular tooth brushing, flossing, and dental visits.    NEXT PREVENTATIVE PHYSICAL DUE IN 1 YEAR. Return in about 1 year (around 02/26/2018) for CPE.

## 2017-02-27 LAB — CBC WITH DIFFERENTIAL/PLATELET
BASOS: 0 %
Basophils Absolute: 0 10*3/uL (ref 0.0–0.2)
EOS (ABSOLUTE): 0.1 10*3/uL (ref 0.0–0.4)
EOS: 2 %
HEMATOCRIT: 38.7 % (ref 34.0–46.6)
Hemoglobin: 12.7 g/dL (ref 11.1–15.9)
IMMATURE GRANS (ABS): 0 10*3/uL (ref 0.0–0.1)
Immature Granulocytes: 0 %
LYMPHS: 27 %
Lymphocytes Absolute: 2.2 10*3/uL (ref 0.7–3.1)
MCH: 29 pg (ref 26.6–33.0)
MCHC: 32.8 g/dL (ref 31.5–35.7)
MCV: 88 fL (ref 79–97)
Monocytes Absolute: 0.4 10*3/uL (ref 0.1–0.9)
Monocytes: 5 %
Neutrophils Absolute: 5.5 10*3/uL (ref 1.4–7.0)
Neutrophils: 66 %
PLATELETS: 370 10*3/uL (ref 150–379)
RBC: 4.38 x10E6/uL (ref 3.77–5.28)
RDW: 14.3 % (ref 12.3–15.4)
WBC: 8.3 10*3/uL (ref 3.4–10.8)

## 2017-02-27 LAB — LIPID PANEL
CHOL/HDL RATIO: 4.5 ratio — AB (ref 0.0–4.4)
Cholesterol, Total: 218 mg/dL — ABNORMAL HIGH (ref 100–199)
HDL: 48 mg/dL (ref >39)
LDL CALC: 137 mg/dL — AB (ref 0–99)
TRIGLYCERIDES: 165 mg/dL — AB (ref 0–149)
VLDL CHOLESTEROL CAL: 33 mg/dL (ref 5–40)

## 2017-02-27 LAB — COMPREHENSIVE METABOLIC PANEL
A/G RATIO: 1.5 (ref 1.2–2.2)
ALT: 22 IU/L (ref 0–32)
AST: 16 IU/L (ref 0–40)
Albumin: 4.3 g/dL (ref 3.5–5.5)
Alkaline Phosphatase: 75 IU/L (ref 39–117)
BUN/Creatinine Ratio: 15 (ref 9–23)
BUN: 11 mg/dL (ref 6–24)
Bilirubin Total: <0.2 mg/dL (ref 0.0–1.2)
CALCIUM: 9.5 mg/dL (ref 8.7–10.2)
CO2: 29 mmol/L (ref 20–29)
CREATININE: 0.72 mg/dL (ref 0.57–1.00)
Chloride: 103 mmol/L (ref 96–106)
GFR, EST AFRICAN AMERICAN: 107 mL/min/{1.73_m2} (ref >59)
GFR, EST NON AFRICAN AMERICAN: 93 mL/min/{1.73_m2} (ref >59)
GLOBULIN, TOTAL: 2.8 g/dL (ref 1.5–4.5)
Glucose: 71 mg/dL (ref 65–99)
POTASSIUM: 4.2 mmol/L (ref 3.5–5.2)
Sodium: 145 mmol/L — ABNORMAL HIGH (ref 134–144)
TOTAL PROTEIN: 7.1 g/dL (ref 6.0–8.5)

## 2017-02-27 LAB — TSH: TSH: 1.98 u[IU]/mL (ref 0.450–4.500)

## 2017-02-27 LAB — HEPATITIS C ANTIBODY: Hep C Virus Ab: <0.1 s/co ratio (ref 0.0–0.9)

## 2017-02-27 LAB — HIV ANTIBODY (ROUTINE TESTING W REFLEX): HIV SCREEN 4TH GENERATION: NONREACTIVE

## 2017-02-27 MED ORDER — ATORVASTATIN CALCIUM 10 MG PO TABS: 10.0000 mg | ORAL_TABLET | Freq: Every day | ORAL | 1 refills | Status: DC

## 2017-02-28 ENCOUNTER — Encounter: Payer: Self-pay | Admitting: Family Medicine

## 2017-02-28 ENCOUNTER — Other Ambulatory Visit: Payer: Self-pay | Admitting: Family Medicine

## 2017-02-28 DIAGNOSIS — I83812 Varicose veins of left lower extremities with pain: Secondary | ICD-10-CM

## 2017-02-28 LAB — IGP, APTIMA HPV, RFX 16/18,45
HPV Aptima: NEGATIVE
PAP Smear Comment: 0

## 2017-02-28 NOTE — Assessment & Plan Note (Signed)
Stable, continue on the effexor

## 2017-02-28 NOTE — Assessment & Plan Note (Signed)
Stable, continue current regimen 

## 2017-03-20 ENCOUNTER — Encounter (INDEPENDENT_AMBULATORY_CARE_PROVIDER_SITE_OTHER): Payer: Self-pay | Admitting: Vascular Surgery

## 2017-03-20 ENCOUNTER — Ambulatory Visit (INDEPENDENT_AMBULATORY_CARE_PROVIDER_SITE_OTHER): Payer: 59 | Admitting: Vascular Surgery

## 2017-03-20 VITALS — BP 129/80 | HR 64 | Resp 17 | Ht 62.0 in | Wt 230.0 lb

## 2017-03-20 DIAGNOSIS — F329 Major depressive disorder, single episode, unspecified: Secondary | ICD-10-CM | POA: Diagnosis not present

## 2017-03-20 DIAGNOSIS — E785 Hyperlipidemia, unspecified: Secondary | ICD-10-CM | POA: Diagnosis not present

## 2017-03-20 DIAGNOSIS — I83813 Varicose veins of bilateral lower extremities with pain: Secondary | ICD-10-CM

## 2017-03-20 DIAGNOSIS — M7989 Other specified soft tissue disorders: Secondary | ICD-10-CM | POA: Diagnosis not present

## 2017-03-20 DIAGNOSIS — F32A Depression, unspecified: Secondary | ICD-10-CM

## 2017-03-20 NOTE — Progress Notes (Signed)
Patient ID: Jill Burch, female   DOB: 25-Oct-1960, 57 y.o.   MRN: [id number]  Chief Complaint  Patient presents with  . New Patient (Initial Visit)    ref for varicose veins    HPI Jill Burch is a 57 y.o. female.  I am asked to see the patient by R. Maurice March, PA-C for evaluation of leg pain and varicose veins.  The patient presents with complaints of symptomatic varicosities of the lower extremities. The patient reports a long standing history of varicosities and they have become painful over time. There was no clear inciting event or causative factor that started the symptoms.  The left leg is more severly affected. The patient elevates the legs for relief. The pain is described as aching and heaviness. The symptoms are generally most severe in the evening, particularly when they have been on their feet for long periods of time. Under the direction of her primary care physician, compression stockings have been used sure about 6 months now to try to improve the symptoms with mild success. The patient complains of  worsening swelling as an associated symptom.  Her swelling is persistent and now present pretty much every day.  The patient has no previous history of deep venous thrombosis or superficial thrombophlebitis to their knowledge.     Past Medical History:  Diagnosis Date  . Anxiety   . Depression     Past Surgical History:  Procedure Laterality Date  . TUBAL LIGATION      Family History  Problem Relation Age of Onset  . Depression Mother   . Arthritis Mother   . Heart disease Mother   . Heart attack Mother   . Cancer Father        bladder  . Diabetes Father   . Depression Father   . Hypertension Father   . Diabetes Brother   . COPD Neg Hx   . Stroke Neg Hx      Social History Social History   Tobacco Use  . Smoking status: Former Smoker    Types: Cigarettes    Last attempt to quit: 02/06/1990    Years since quitting: 27.1  . Smokeless tobacco: Never Used    Substance Use Topics  . Alcohol use: No    Alcohol/week: 0.0 oz  . Drug use: No     No Known Allergies  Current Outpatient Medications  Medication Sig Dispense Refill  . atorvastatin (LIPITOR) 10 MG tablet Take 1 tablet (10 mg total) by mouth daily. 90 tablet 1  . benzonatate (TESSALON) 100 MG capsule Take 1 capsule (100 mg total) by mouth 2 (two) times daily as needed for cough. 20 capsule 0  . cyclobenzaprine (FLEXERIL) 10 MG tablet Take 1 tablet (10 mg total) by mouth 3 (three) times daily as needed for muscle spasms. 60 tablet 0  . diclofenac sodium (VOLTAREN) 1 % GEL Apply 2 g topically 4 (four) times daily. 100 g 11  . meloxicam (MOBIC) 15 MG tablet Take 1 tablet (15 mg total) by mouth daily. 30 tablet 11  . venlafaxine XR (EFFEXOR-XR) 150 MG 24 hr capsule Take 1 capsule (150 mg total) by mouth daily. 90 capsule 3   No current facility-administered medications for this visit.       REVIEW OF SYSTEMS (Negative unless checked)  Constitutional: [] Weight loss  [] Fever  [] Chills Cardiac: [] Chest pain   [] Chest pressure   [] Palpitations   [] Shortness of breath when laying flat   [] Shortness of  breath at rest   [] Shortness of breath with exertion. Vascular:  [] Pain in legs with walking   [] Pain in legs at rest   [] Pain in legs when laying flat   [] Claudication   [] Pain in feet when walking  [] Pain in feet at rest  [] Pain in feet when laying flat   [] History of DVT   [] Phlebitis   [x] Swelling in legs   [x] Varicose veins   [] Non-healing ulcers Pulmonary:   [] Uses home oxygen   [] Productive cough   [] Hemoptysis   [] Wheeze  [] COPD   [] Asthma Neurologic:  [] Dizziness  [] Blackouts   [] Seizures   [] History of stroke   [] History of TIA  [] Aphasia   [] Temporary blindness   [] Dysphagia   [] Weakness or numbness in arms   [] Weakness or numbness in legs Musculoskeletal:  [] Arthritis   [] Joint swelling   [] Joint pain   [] Low back pain Hematologic:  [] Easy bruising  [] Easy bleeding    [] Hypercoagulable state   [] Anemic  [] Hepatitis Gastrointestinal:  [] Blood in stool   [] Vomiting blood  [] Gastroesophageal reflux/heartburn   [] Abdominal pain Genitourinary:  [] Chronic kidney disease   [] Difficult urination  [] Frequent urination  [] Burning with urination   [] Hematuria Skin:  [] Rashes   [] Ulcers   [] Wounds Psychological:  [x] History of anxiety   [x]  History of major depression.    Physical Exam BP 129/80 (BP Location: Right Arm)   Pulse 64   Resp 17   Ht 5\' 2"  (1.575 m)   Wt 104.3 kg (230 lb)   BMI 42.07 kg/m  Gen:  WD/WN, NAD Head: [place]/AT, No temporalis wasting.  Ear/Nose/Throat: Hearing grossly intact, dentition fair Eyes: Sclera non-icteric. Conjunctiva clear Neck: Supple, no nuchal rigidity. Trachea midline Pulmonary:  Good air movement, no use of accessory muscles, respirations not labored.  Cardiac: RRR, No JVD Vascular: Varicosities diffuse and measuring up to 2 mm in the right lower extremity        Varicosities extensive and measuring up to 3-4 mm in the left lower extremity Vessel Right Left  Radial Palpable Palpable                          PT Palpable Palpable  DP Palpable Palpable   Gastrointestinal: soft, non-tender/non-distended.   Musculoskeletal: M/S 5/5 throughout.   Trace RLE edema.  1 + LLE edema Neurologic: Sensation grossly intact in extremities.  Symmetrical.  Speech is fluent.  Psychiatric: Judgment intact, Mood & affect appropriate for pt's clinical situation. Dermatologic: No rashes or ulcers noted.  No cellulitis or open wounds.    Radiology No results found.  Labs Recent Results (from the past 2160 hour(s))  IGP, Aptima HPV, rfx 16/18,45     Status: None   Collection Time: 02/26/17  8:59 AM  Result Value Ref Range   DIAGNOSIS: Comment     Comment: NEGATIVE FOR INTRAEPITHELIAL LESION OR MALIGNANCY.   Specimen adequacy: Comment     Comment: Satisfactory for evaluation. No endocervical component is identified.   Clinician  Provided ICD10 Comment     Comment: Z12.4 Z00.00    Performed by: Comment     Comment: Marijean Bravo, Cytotechnologist (ASCP)   PAP Smear Comment .    Note: Comment     Comment: The Pap smear is a screening test designed to aid in the detection of premalignant and malignant conditions of the uterine cervix.  It is not a diagnostic procedure and should not be used as the sole means of  detecting cervical cancer.  Both false-positive and false-negative reports do occur.    Test Methodology Comment     Comment: This liquid based ThinPrep(R) pap test was screened with the use of an image guided system.    HPV Aptima Negative Negative    Comment: This test detects fourteen high-risk HPV types (16/18/31/33/35/39/45/ 51/52/56/58/59/66/68) without differentiation.   UA/M w/rflx Culture, Routine     Status: None   Collection Time: 02/26/17  8:59 AM  Result Value Ref Range   Specific Gravity, UA 1.025 1.005 - 1.030   pH, UA 6.0 5.0 - 7.5   Color, UA Yellow Yellow   Appearance Ur Clear Clear   Leukocytes, UA Negative Negative   Protein, UA Negative Negative/Trace   Glucose, UA Negative Negative   Ketones, UA Negative Negative   RBC, UA Negative Negative   Bilirubin, UA Negative Negative   Urobilinogen, Ur 0.2 0.2 - 1.0 mg/dL   Nitrite, UA Negative Negative  Hepatitis C Antibody     Status: None   Collection Time: 02/26/17 10:27 AM  Result Value Ref Range   Hep C Virus Ab <0.1 0.0 - 0.9 s/co ratio    Comment:                                   Negative:     < 0.8                              Indeterminate: 0.8 - 0.9                                   Positive:     > 0.9  The CDC recommends that a positive HCV antibody result  be followed up with a HCV Nucleic Acid Amplification  test (161096).   HIV antibody (with reflex)     Status: None   Collection Time: 02/26/17 10:27 AM  Result Value Ref Range   HIV Screen 4th Generation wRfx Non Reactive Non Reactive  CBC with  Differential/Platelet     Status: None   Collection Time: 02/26/17 10:27 AM  Result Value Ref Range   WBC 8.3 3.4 - 10.8 x10E3/uL   RBC 4.38 3.77 - 5.28 x10E6/uL   Hemoglobin 12.7 11.1 - 15.9 g/dL   Hematocrit 04.5 40.9 - 46.6 %   MCV 88 79 - 97 fL   MCH 29.0 26.6 - 33.0 pg   MCHC 32.8 31.5 - 35.7 g/dL   RDW 81.1 91.4 - 78.2 %   Platelets 370 150 - 379 x10E3/uL   Neutrophils 66 Not Estab. %   Lymphs 27 Not Estab. %   Monocytes 5 Not Estab. %   Eos 2 Not Estab. %   Basos 0 Not Estab. %   Neutrophils Absolute 5.5 1.4 - 7.0 x10E3/uL   Lymphocytes Absolute 2.2 0.7 - 3.1 x10E3/uL   Monocytes Absolute 0.4 0.1 - 0.9 x10E3/uL   EOS (ABSOLUTE) 0.1 0.0 - 0.4 x10E3/uL   Basophils Absolute 0.0 0.0 - 0.2 x10E3/uL   Immature Granulocytes 0 Not Estab. %   Immature Grans (Abs) 0.0 0.0 - 0.1 x10E3/uL  Comprehensive metabolic panel     Status: Abnormal   Collection Time: 02/26/17 10:27 AM  Result Value Ref Range   Glucose 71 65 - 99 mg/dL  BUN 11 6 - 24 mg/dL   Creatinine, Ser 8.11 0.57 - 1.00 mg/dL   GFR calc non Af Amer 93 >59 mL/min/1.73   GFR calc Af Amer 107 >59 mL/min/1.73   BUN/Creatinine Ratio 15 9 - 23   Sodium 145 (H) 134 - 144 mmol/L   Potassium 4.2 3.5 - 5.2 mmol/L   Chloride 103 96 - 106 mmol/L   CO2 29 20 - 29 mmol/L   Calcium 9.5 8.7 - 10.2 mg/dL   Total Protein 7.1 6.0 - 8.5 g/dL   Albumin 4.3 3.5 - 5.5 g/dL   Globulin, Total 2.8 1.5 - 4.5 g/dL   Albumin/Globulin Ratio 1.5 1.2 - 2.2   Bilirubin Total <0.2 0.0 - 1.2 mg/dL   Alkaline Phosphatase 75 39 - 117 IU/L   AST 16 0 - 40 IU/L   ALT 22 0 - 32 IU/L  Lipid panel     Status: Abnormal   Collection Time: 02/26/17 10:27 AM  Result Value Ref Range   Cholesterol, Total 218 (H) 100 - 199 mg/dL   Triglycerides 914 (H) 0 - 149 mg/dL   HDL 48 >78 mg/dL   VLDL Cholesterol Cal 33 5 - 40 mg/dL   LDL Calculated 295 (H) 0 - 99 mg/dL   Chol/HDL Ratio 4.5 (H) 0.0 - 4.4 ratio    Comment:                                   T.  Chol/HDL Ratio                                             Men  Women                               1/2 Avg.Risk  3.4    3.3                                   Avg.Risk  5.0    4.4                                2X Avg.Risk  9.6    7.1                                3X Avg.Risk 23.4   11.0   TSH     Status: None   Collection Time: 02/26/17 10:27 AM  Result Value Ref Range   TSH 1.980 0.450 - 4.500 uIU/mL    Assessment/Plan:  Hyperlipidemia lipid control important in reducing the progression of atherosclerotic disease. Continue statin therapy   Depression On medication  Swelling of limb Likely from venous disease. Other things like lymphedema could be present as well.  Varicose veins of bilateral lower extremities with pain   The patient has symptoms consistent with chronic venous insufficiency. We discussed the natural history and treatment options for venous disease. I recommended the regular use of 20 - 30 mm Hg compression stockings, and she has been using these for six months with minimal success. I recommended leg  elevation and anti-inflammatories as needed for pain. I have also recommended a complete venous duplex to assess the venous system for reflux or thrombotic issues. This can be done at the patient's convenience. I will see the patient back after the duplex to assess the response to conservative management, and determine further treatment options.     Festus BarrenJason Rashanna Christiana 03/20/2017, 1:25 PM   This note was created with Dragon medical transcription system.  Any errors from dictation are unintentional.

## 2017-03-20 NOTE — Assessment & Plan Note (Signed)
lipid control important in reducing the progression of atherosclerotic disease. Continue statin therapy  

## 2017-03-20 NOTE — Assessment & Plan Note (Signed)
On medication

## 2017-03-20 NOTE — Patient Instructions (Signed)
Varicose Veins Varicose veins are veins that have become enlarged and twisted. They are usually seen in the legs but can occur in other parts of the body as well. What are the causes? This condition is the result of valves in the veins not working properly. Valves in the veins help to return blood from the leg to the heart. If these valves are damaged, blood flows backward and backs up into the veins in the leg near the skin. This causes the veins to become larger. What increases the risk? People who are on their feet a lot, who are pregnant, or who are overweight are more likely to develop varicose veins. What are the signs or symptoms?  Bulging, twisted-appearing, bluish veins, most commonly found on the legs.  Leg pain or a feeling of heaviness. These symptoms may be worse at the end of the day.  Leg swelling.  Changes in skin color. How is this diagnosed? A health care provider can usually diagnose varicose veins by examining your legs. Your health care provider may also recommend an ultrasound of your leg veins. How is this treated? Most varicose veins can be treated at home.However, other treatments are available for people who have persistent symptoms or want to improve the cosmetic appearance of the varicose veins. These treatment options include:  Sclerotherapy. A solution is injected into the vein to close it off.  Laser treatment. A laser is used to heat the vein to close it off.  Radiofrequency vein ablation. An electrical current produced by radio waves is used to close off the vein.  Phlebectomy. The vein is surgically removed through small incisions made over the varicose vein.  Vein ligation and stripping. The vein is surgically removed through incisions made over the varicose vein after the vein has been tied (ligated). Follow these instructions at home:   Do not stand or sit in one position for long periods of time. Do not sit with your legs crossed. Rest with your  legs raised during the day.  Wear compression stockings as directed by your health care provider. These stockings help to prevent blood clots and reduce swelling in your legs.  Do not wear other tight, encircling garments around your legs, pelvis, or waist.  Walk as much as possible to increase blood flow.  Raise the foot of your bed at night with 2-inch blocks.  If you get a cut in the skin over the vein and the vein bleeds, lie down with your leg raised and press on it with a clean cloth until the bleeding stops. Then place a bandage (dressing) on the cut. See your health care provider if it continues to bleed. Contact a health care provider if:  The skin around your ankle starts to break down.  You have pain, redness, tenderness, or hard swelling in your leg over a vein.  You are uncomfortable because of leg pain. This information is not intended to replace advice given to you by your health care provider. Make sure you discuss any questions you have with your health care provider. Document Released: 11/02/2004 Document Revised: 07/01/2015 Document Reviewed: 07/27/2015 Elsevier Interactive Patient Education  2017 [employer]  

## 2017-03-20 NOTE — Assessment & Plan Note (Signed)
Likely from venous disease. Other things like lymphedema could be present as well.

## 2017-04-27 ENCOUNTER — Ambulatory Visit (INDEPENDENT_AMBULATORY_CARE_PROVIDER_SITE_OTHER): Payer: 59

## 2017-04-27 ENCOUNTER — Encounter (INDEPENDENT_AMBULATORY_CARE_PROVIDER_SITE_OTHER): Payer: Self-pay | Admitting: Vascular Surgery

## 2017-04-27 ENCOUNTER — Ambulatory Visit (INDEPENDENT_AMBULATORY_CARE_PROVIDER_SITE_OTHER): Payer: 59 | Admitting: Vascular Surgery

## 2017-04-27 VITALS — BP 128/85 | HR 63 | Resp 15 | Ht 62.5 in | Wt 233.0 lb

## 2017-04-27 DIAGNOSIS — I83813 Varicose veins of bilateral lower extremities with pain: Secondary | ICD-10-CM | POA: Diagnosis not present

## 2017-04-27 DIAGNOSIS — I890 Lymphedema, not elsewhere classified: Secondary | ICD-10-CM | POA: Diagnosis not present

## 2017-04-27 DIAGNOSIS — I872 Venous insufficiency (chronic) (peripheral): Secondary | ICD-10-CM

## 2017-04-27 NOTE — Progress Notes (Signed)
Subjective:    Patient ID: Jill Burch, female    DOB: 1960-04-24, 57 y.o.   MRN: [id number] Chief Complaint  Patient presents with  . Follow-up    Bilateral reflux f/u   Patient presents to review vascular studies.  The patient was last seen on March 20, 2017 and evaluation of lower extremity edema and discomfort.  The patient has been engaging in conservative therapy for many months including wearing medical grade 1 compression stockings and elevating her legs however this has not provided any relief to her symptoms.  The patient notes that her symptoms have progressively worsened now affecting her ability to function on a daily basis.  The patient feels that her symptoms are lifestyle limiting.  The patient underwent a bilateral lower extremity venous reflux study which was notable for great saphenous vein insufficiency bilaterally.  Venous insufficiency in the deep venous system bilaterally.  No evidence of deep vein thrombosis or superficial venous thrombosis to the bilateral lower extremity.  The patient denies any rest pain or ulceration to the bilateral lower extremity.  The patient denies any fever, nausea or vomiting.  Review of Systems  Constitutional: Negative.   HENT: Negative.   Eyes: Negative.   Respiratory: Negative.   Cardiovascular: Positive for leg swelling.       Painful varicose veins  Gastrointestinal: Negative.   Endocrine: Negative.   Genitourinary: Negative.   Musculoskeletal: Negative.   Skin: Negative.   Allergic/Immunologic: Negative.   Neurological: Negative.   Hematological: Negative.   Psychiatric/Behavioral: Negative.       Objective:   Physical Exam  Constitutional: She is oriented to person, place, and time. She appears well-developed and well-nourished. No distress.  HENT:  Head: Normocephalic and atraumatic.  Eyes: Pupils are equal, round, and reactive to light. Conjunctivae are normal.  Neck: Normal range of motion.  Cardiovascular: Normal  rate, regular rhythm, normal heart sounds and intact distal pulses.  Pulses:      Radial pulses are 2+ on the right side, and 2+ on the left side.       Dorsalis pedis pulses are 2+ on the right side, and 2+ on the left side.       Posterior tibial pulses are 2+ on the right side, and 2+ on the left side.  Pulmonary/Chest: Effort normal and breath sounds normal.  Musculoskeletal: Normal range of motion. She exhibits edema (Moderate nonpitting bilateral lower extremity).  Neurological: She is alert and oriented to person, place, and time.  Skin: She is not diaphoretic.  Psychiatric: She has a normal mood and affect. Her behavior is normal. Judgment and thought content normal.  Vitals reviewed.  BP 128/85 (BP Location: Right Arm, Patient Position: Sitting)   Pulse 63   Resp 15   Ht 5' 2.5" (1.588 m)   Wt 233 lb (105.7 kg)   BMI 41.94 kg/m   Past Medical History:  Diagnosis Date  . Anxiety   . Depression    Social History   Socioeconomic History  . Marital status: Married    Spouse name: Not on file  . Number of children: Not on file  . Years of education: Not on file  . Highest education level: Not on file  Occupational History  . Not on file  Social Needs  . Financial resource strain: Not on file  . Food insecurity:    Worry: Not on file    Inability: Not on file  . Transportation needs:    Medical: Not  on file    Non-medical: Not on file  Tobacco Use  . Smoking status: Former Smoker    Types: Cigarettes    Last attempt to quit: 02/06/1990    Years since quitting: 27.2  . Smokeless tobacco: Never Used  Substance and Sexual Activity  . Alcohol use: No    Alcohol/week: 0.0 oz  . Drug use: No  . Sexual activity: Not on file  Lifestyle  . Physical activity:    Days per week: Not on file    Minutes per session: Not on file  . Stress: Not on file  Relationships  . Social connections:    Talks on phone: Not on file    Gets together: Not on file    Attends  religious service: Not on file    Active member of club or organization: Not on file    Attends meetings of clubs or organizations: Not on file    Relationship status: Not on file  . Intimate partner violence:    Fear of current or ex partner: Not on file    Emotionally abused: Not on file    Physically abused: Not on file    Forced sexual activity: Not on file  Other Topics Concern  . Not on file  Social History Narrative  . Not on file   Past Surgical History:  Procedure Laterality Date  . TUBAL LIGATION     Family History  Problem Relation Age of Onset  . Depression Mother   . Arthritis Mother   . Heart disease Mother   . Heart attack Mother   . Cancer Father        bladder  . Diabetes Father   . Depression Father   . Hypertension Father   . Diabetes Brother   . COPD Neg Hx   . Stroke Neg Hx    No Known Allergies     Assessment & Plan:  Patient presents to review vascular studies.  The patient was last seen on March 20, 2017 and evaluation of lower extremity edema and discomfort.  The patient has been engaging in conservative therapy for many months including wearing medical grade 1 compression stockings and elevating her legs however this has not provided any relief to her symptoms.  The patient notes that her symptoms have progressively worsened now affecting her ability to function on a daily basis.  The patient feels that her symptoms are lifestyle limiting.  The patient underwent a bilateral lower extremity venous reflux study which was notable for great saphenous vein insufficiency bilaterally.  Venous insufficiency in the deep venous system bilaterally.  No evidence of deep vein thrombosis or superficial venous thrombosis to the bilateral lower extremity.  The patient denies any rest pain or ulceration to the bilateral lower extremity.  The patient denies any fever, nausea or vomiting.  1. Chronic venous insufficiency - New Patient was found to have venous  insufficiency to the bilateral great saphenous veins Patient was found to have venous insufficiency to the deep venous system The patient has been engaging in conservative therapy for many months however this is not provided any relief to her symptoms The patient's symptoms have aggressive the point that she is unable to function on a daily basis The patient is likely to benefit from endovenous laser ablation. I have discussed the risks and benefits of the procedure. The risks primarily include DVT, recanalization, bleeding, infection, and inability to gain access. I will applied to the patient's insurance The patient was  instructed to call the office in the interim if any worsening edema or ulcerations to the legs, feet or toes occurs. The patient expresses their understanding.  2. Lymphedema - New Despite conservative treatments including exercise, elevation and class I compression stockings the patient still presents with stage I lymphedema The patient will greatly benefit from the added therapy of a lymphedema pump I will apply to her insurance The patient is to follow-up in 2 months so I can assess her progress with conservative therapy lymphedema pump and possibly laser ablation to the great saphenous veins  3. Varicose veins of bilateral lower extremities with pain - Stable Encouraged good control as its slows the progression of atherosclerotic disease  Current Outpatient Medications on File Prior to Visit  Medication Sig Dispense Refill  . atorvastatin (LIPITOR) 10 MG tablet Take 1 tablet (10 mg total) by mouth daily. 90 tablet 1  . cyclobenzaprine (FLEXERIL) 10 MG tablet Take 1 tablet (10 mg total) by mouth 3 (three) times daily as needed for muscle spasms. 60 tablet 0  . diclofenac sodium (VOLTAREN) 1 % GEL Apply 2 g topically 4 (four) times daily. 100 g 11  . meloxicam (MOBIC) 15 MG tablet Take 1 tablet (15 mg total) by mouth daily. 30 tablet 11  . venlafaxine XR (EFFEXOR-XR) 150  MG 24 hr capsule Take 1 capsule (150 mg total) by mouth daily. 90 capsule 3  . benzonatate (TESSALON) 100 MG capsule Take 1 capsule (100 mg total) by mouth 2 (two) times daily as needed for cough. (Patient not taking: Reported on 04/27/2017) 20 capsule 0   No current facility-administered medications on file prior to visit.    There are no Patient Instructions on file for this visit. No follow-ups on file.  Lj Miyamoto A Hendrix Console, PA-C

## 2017-05-15 ENCOUNTER — Ambulatory Visit: Payer: 59 | Admitting: Unknown Physician Specialty

## 2017-05-15 ENCOUNTER — Encounter: Payer: Self-pay | Admitting: Unknown Physician Specialty

## 2017-05-15 VITALS — BP 120/80 | HR 73 | Temp 98.7°F | Ht 62.0 in | Wt 228.6 lb

## 2017-05-15 DIAGNOSIS — J0100 Acute maxillary sinusitis, unspecified: Secondary | ICD-10-CM | POA: Diagnosis not present

## 2017-05-15 DIAGNOSIS — R059 Cough, unspecified: Secondary | ICD-10-CM

## 2017-05-15 DIAGNOSIS — R05 Cough: Secondary | ICD-10-CM

## 2017-05-15 MED ORDER — HYDROCOD POLST-CPM POLST ER 10-8 MG/5ML PO SUER: 5.0000 mL | Freq: Two times a day (BID) | ORAL | 0 refills | Status: DC | PRN

## 2017-05-15 MED ORDER — AZITHROMYCIN 250 MG PO TABS: ORAL_TABLET | ORAL | 0 refills | Status: DC

## 2017-05-15 NOTE — Progress Notes (Signed)
BP 120/80   Pulse 73   Temp 98.7 F (37.1 C) (Oral)   Ht 5\' 2"  (1.575 m)   Wt 228 lb 9.6 oz (103.7 kg)   SpO2 96%   BMI 41.81 kg/m    Subjective:    Patient ID: Jill Burch, female    DOB: [birth year], 59 y.o.   MRN: [id number]  HPI: Jill Burch is a 57 y.o. female  Chief Complaint  Patient presents with  . URI    pt states she has had chest congestion, cough, wheezing, sinus pressure, dairrhea, and fatigue for the past 4 days   URI   This is a new problem. Episode onset: 4 days. The problem has been gradually worsening. There has been no fever (hot flashes). Associated symptoms include congestion, coughing, diarrhea, headaches, rhinorrhea and sinus pain. Pertinent negatives include no abdominal pain, chest pain, dysuria, ear pain, joint pain, joint swelling, nausea, neck pain, plugged ear sensation, rash, sneezing, sore throat, swollen glands, vomiting or wheezing. Treatments tried: robitussin. The treatment provided no relief.     Relevant past medical, surgical, family and social history reviewed and updated as indicated. Interim medical history since our last visit reviewed. Allergies and medications reviewed and updated.  Review of Systems  HENT: Positive for congestion, rhinorrhea and sinus pain. Negative for ear pain, sneezing and sore throat.   Respiratory: Positive for cough. Negative for wheezing.   Cardiovascular: Negative for chest pain.  Gastrointestinal: Positive for diarrhea. Negative for abdominal pain, nausea and vomiting.  Genitourinary: Negative for dysuria.  Musculoskeletal: Negative for joint pain and neck pain.  Skin: Negative for rash.  Neurological: Positive for headaches.    Per HPI unless specifically indicated above     Objective:    BP 120/80   Pulse 73   Temp 98.7 F (37.1 C) (Oral)   Ht 5\' 2"  (1.575 m)   Wt 228 lb 9.6 oz (103.7 kg)   SpO2 96%   BMI 41.81 kg/m   Wt Readings from Last 3 Encounters:  05/15/17 228 lb 9.6 oz (103.7  kg)  04/27/17 233 lb (105.7 kg)  03/20/17 230 lb (104.3 kg)    Physical Exam  Constitutional: She is oriented to person, place, and time. She appears well-developed and well-nourished. No distress.  HENT:  Head: Normocephalic and atraumatic.  Right Ear: Tympanic membrane and ear canal normal.  Left Ear: Tympanic membrane and ear canal normal.  Nose: No rhinorrhea. Right sinus exhibits maxillary sinus tenderness. Right sinus exhibits no frontal sinus tenderness. Left sinus exhibits maxillary sinus tenderness. Left sinus exhibits no frontal sinus tenderness.  Eyes: Conjunctivae and lids are normal. Right eye exhibits no discharge. Left eye exhibits no discharge. No scleral icterus.  Cardiovascular: Normal rate and regular rhythm.  Pulmonary/Chest: Effort normal and breath sounds normal. No respiratory distress.  Abdominal: Normal appearance. There is no splenomegaly or hepatomegaly.  Musculoskeletal: Normal range of motion.  Neurological: She is alert and oriented to person, place, and time.  Skin: Skin is intact. No rash noted. No pallor.  Psychiatric: She has a normal mood and affect. Her behavior is normal. Judgment and thought content normal.    Results for orders placed or performed in visit on 02/26/17  Hepatitis C Antibody  Result Value Ref Range   Hep C Virus Ab <0.1 0.0 - 0.9 s/co ratio  HIV antibody (with reflex)  Result Value Ref Range   HIV Screen 4th Generation wRfx Non Reactive Non Reactive  CBC with  Differential/Platelet  Result Value Ref Range   WBC 8.3 3.4 - 10.8 x10E3/uL   RBC 4.38 3.77 - 5.28 x10E6/uL   Hemoglobin 12.7 11.1 - 15.9 g/dL   Hematocrit 16.138.7 09.634.0 - 46.6 %   MCV 88 79 - 97 fL   MCH 29.0 26.6 - 33.0 pg   MCHC 32.8 31.5 - 35.7 g/dL   RDW 04.514.3 40.912.3 - 81.115.4 %   Platelets 370 150 - 379 x10E3/uL   Neutrophils 66 Not Estab. %   Lymphs 27 Not Estab. %   Monocytes 5 Not Estab. %   Eos 2 Not Estab. %   Basos 0 Not Estab. %   Neutrophils Absolute 5.5 1.4 -  7.0 x10E3/uL   Lymphocytes Absolute 2.2 0.7 - 3.1 x10E3/uL   Monocytes Absolute 0.4 0.1 - 0.9 x10E3/uL   EOS (ABSOLUTE) 0.1 0.0 - 0.4 x10E3/uL   Basophils Absolute 0.0 0.0 - 0.2 x10E3/uL   Immature Granulocytes 0 Not Estab. %   Immature Grans (Abs) 0.0 0.0 - 0.1 x10E3/uL  Comprehensive metabolic panel  Result Value Ref Range   Glucose 71 65 - 99 mg/dL   BUN 11 6 - 24 mg/dL   Creatinine, Ser 9.140.72 0.57 - 1.00 mg/dL   GFR calc non Af Amer 93 >59 mL/min/1.73   GFR calc Af Amer 107 >59 mL/min/1.73   BUN/Creatinine Ratio 15 9 - 23   Sodium 145 (H) 134 - 144 mmol/L   Potassium 4.2 3.5 - 5.2 mmol/L   Chloride 103 96 - 106 mmol/L   CO2 29 20 - 29 mmol/L   Calcium 9.5 8.7 - 10.2 mg/dL   Total Protein 7.1 6.0 - 8.5 g/dL   Albumin 4.3 3.5 - 5.5 g/dL   Globulin, Total 2.8 1.5 - 4.5 g/dL   Albumin/Globulin Ratio 1.5 1.2 - 2.2   Bilirubin Total <0.2 0.0 - 1.2 mg/dL   Alkaline Phosphatase 75 39 - 117 IU/L   AST 16 0 - 40 IU/L   ALT 22 0 - 32 IU/L  Lipid panel  Result Value Ref Range   Cholesterol, Total 218 (H) 100 - 199 mg/dL   Triglycerides 782165 (H) 0 - 149 mg/dL   HDL 48 >95>39 mg/dL   VLDL Cholesterol Cal 33 5 - 40 mg/dL   LDL Calculated 621137 (H) 0 - 99 mg/dL   Chol/HDL Ratio 4.5 (H) 0.0 - 4.4 ratio  UA/M w/rflx Culture, Routine  Result Value Ref Range   Specific Gravity, UA 1.025 1.005 - 1.030   pH, UA 6.0 5.0 - 7.5   Color, UA Yellow Yellow   Appearance Ur Clear Clear   Leukocytes, UA Negative Negative   Protein, UA Negative Negative/Trace   Glucose, UA Negative Negative   Ketones, UA Negative Negative   RBC, UA Negative Negative   Bilirubin, UA Negative Negative   Urobilinogen, Ur 0.2 0.2 - 1.0 mg/dL   Nitrite, UA Negative Negative  TSH  Result Value Ref Range   TSH 1.980 0.450 - 4.500 uIU/mL  IGP, Aptima HPV, rfx 16/18,45  Result Value Ref Range   DIAGNOSIS: Comment    Specimen adequacy: Comment    Clinician Provided ICD10 Comment    Performed by: Comment    PAP Smear  Comment .    Note: Comment    Test Methodology Comment    HPV Aptima Negative Negative      Assessment & Plan:   Problem List Items Addressed This Visit    None    Visit Diagnoses  Acute non-recurrent maxillary sinusitis    -  Primary   New problem.  Pt ed-rest and fluids.  Rx for Zithromax and Tussionex for cough.     Relevant Medications   azithromycin (ZITHROMAX Z-PAK) 250 MG tablet   chlorpheniramine-HYDROcodone (TUSSIONEX PENNKINETIC ER) 10-8 MG/5ML SUER   Cough       Keeping her awake.Marland Kitchen  Rx for Tussionex.  Pt ed on risks of opiate cough medication.        Follow up plan: Return if symptoms worsen or fail to improve.

## 2017-05-15 NOTE — Patient Instructions (Signed)

## 2017-05-17 ENCOUNTER — Encounter: Payer: 59 | Admitting: Family Medicine

## 2017-05-17 DIAGNOSIS — J0100 Acute maxillary sinusitis, unspecified: Secondary | ICD-10-CM | POA: Diagnosis not present

## 2017-05-17 DIAGNOSIS — I890 Lymphedema, not elsewhere classified: Secondary | ICD-10-CM | POA: Diagnosis not present

## 2017-05-17 DIAGNOSIS — B9689 Other specified bacterial agents as the cause of diseases classified elsewhere: Secondary | ICD-10-CM | POA: Diagnosis not present

## 2017-05-17 DIAGNOSIS — J208 Acute bronchitis due to other specified organisms: Secondary | ICD-10-CM | POA: Diagnosis not present

## 2017-06-26 ENCOUNTER — Encounter (INDEPENDENT_AMBULATORY_CARE_PROVIDER_SITE_OTHER): Payer: Self-pay | Admitting: Vascular Surgery

## 2017-06-26 ENCOUNTER — Ambulatory Visit (INDEPENDENT_AMBULATORY_CARE_PROVIDER_SITE_OTHER): Payer: 59 | Admitting: Vascular Surgery

## 2017-06-26 VITALS — BP 132/79 | HR 58 | Resp 16 | Ht 62.0 in | Wt 227.0 lb

## 2017-06-26 DIAGNOSIS — M7989 Other specified soft tissue disorders: Secondary | ICD-10-CM

## 2017-06-26 DIAGNOSIS — E785 Hyperlipidemia, unspecified: Secondary | ICD-10-CM

## 2017-06-26 DIAGNOSIS — I83813 Varicose veins of bilateral lower extremities with pain: Secondary | ICD-10-CM

## 2017-06-26 DIAGNOSIS — F329 Major depressive disorder, single episode, unspecified: Secondary | ICD-10-CM

## 2017-06-26 DIAGNOSIS — F32A Depression, unspecified: Secondary | ICD-10-CM

## 2017-06-26 NOTE — Progress Notes (Signed)
Patient ID: Jill Burch, female   DOB: Jan 27, 1961, 57 y.o.   MRN: [id number]  Chief Complaint  Patient presents with  . Follow-up    3 month, discuss laser    HPI Jill Burch is a 57 y.o. female.  Patient returns in follow up of their venous disease.  They have done their best to comply with the prescribed conservative therapies of compression stockings, leg elevation, exercise, and still requires anti-inflammatories for discomfort and has symptoms that are persistent and bothersome on a daily basis, affecting their activities of daily living and normal activities.  He says the swelling may be a little bit better with the stockings, but the pain is not.  The venous reflux study from earlier this year demonstrates bilateral GSV reflux.    Past Medical History:  Diagnosis Date  . Anxiety   . Depression     Past Surgical History:  Procedure Laterality Date  . TUBAL LIGATION      Family History  Problem Relation Age of Onset  . Depression Mother   . Arthritis Mother   . Heart disease Mother   . Heart attack Mother   . Cancer Father        bladder  . Diabetes Father   . Depression Father   . Hypertension Father   . Diabetes Brother   . COPD Neg Hx   . Stroke Neg Hx     Social History Social History   Tobacco Use  . Smoking status: Former Smoker    Types: Cigarettes    Last attempt to quit: 02/06/1990    Years since quitting: 27.4  . Smokeless tobacco: Never Used  Substance Use Topics  . Alcohol use: No    Alcohol/week: 0.0 oz  . Drug use: No    No Known Allergies  Current Outpatient Medications  Medication Sig Dispense Refill  . atorvastatin (LIPITOR) 10 MG tablet Take 1 tablet (10 mg total) by mouth daily. 90 tablet 1  . azithromycin (ZITHROMAX Z-PAK) 250 MG tablet As directed 6 each 0  . cyclobenzaprine (FLEXERIL) 10 MG tablet Take 1 tablet (10 mg total) by mouth 3 (three) times daily as needed for muscle spasms. 60 tablet 0  . diclofenac sodium (VOLTAREN)  1 % GEL Apply 2 g topically 4 (four) times daily. 100 g 11  . meloxicam (MOBIC) 15 MG tablet Take 1 tablet (15 mg total) by mouth daily. 30 tablet 11  . venlafaxine XR (EFFEXOR-XR) 150 MG 24 hr capsule Take 1 capsule (150 mg total) by mouth daily. 90 capsule 3  . VENTOLIN HFA 108 (90 Base) MCG/ACT inhaler INL 2 PFS PO Q 4 H PRF COUGH OR WHZ  0  . chlorpheniramine-HYDROcodone (TUSSIONEX PENNKINETIC ER) 10-8 MG/5ML SUER Take 5 mLs by mouth every 12 (twelve) hours as needed for cough. (Patient not taking: Reported on 06/26/2017) 115 mL 0   No current facility-administered medications for this visit.      REVIEW OF SYSTEMS (Negative unless checked)  Constitutional: Weight loss  Fever  Chills Cardiac: Chest pain   Chest pressure   Palpitations   Shortness of breath when laying flat   Shortness of breath at rest   Shortness of breath with exertion. Vascular:  Pain in legs with walking   Pain in legs at rest   Pain in legs when laying flat   Claudication   Pain in feet when walking  Pain in feet at rest  Pain in feet when laying  flat   History of DVT   Phlebitis   Swelling in legs   Varicose veins   Non-healing ulcers Pulmonary:   Uses home oxygen   Productive cough   Hemoptysis   Wheeze  COPD   Asthma Neurologic:  Dizziness  Blackouts   Seizures   History of stroke   History of TIA  Aphasia   Temporary blindness   Dysphagia   Weakness or numbness in arms   Weakness or numbness in legs Musculoskeletal:  Arthritis   Joint swelling   Joint pain   Low back pain Hematologic:  Easy bruising  Easy bleeding   Hypercoagulable state   Anemic  Hepatitis Gastrointestinal:  Blood in stool   Vomiting blood  Gastroesophageal reflux/heartburn   Abdominal pain Genitourinary:  Chronic kidney disease   Difficult urination  Frequent urination  Burning with urination   Hematuria Skin:  Rashes   Ulcers    Wounds Psychological:  History of anxiety    History of major depression.     Physical Exam BP 132/79 (BP Location: Right Arm, Patient Position: Sitting)   Pulse (!) 58   Resp 16   Ht  (1.575 m)   Wt 227 lb (103 kg)   BMI 41.52 kg/m  Gen:  WD/WN, NAD Head: [place]/AT, No temporalis wasting.  Ear/Nose/Throat: Hearing grossly intact, dentition good Eyes: Sclera non-icteric. Conjunctiva clear Neck: Supple. Trachea midline Pulmonary:  Good air movement, no use of accessory muscles, respirations not labored.  Cardiac: RRR, No JVD Vascular: Varicosities diffuse and measuring up to 2 mm in the right lower extremity        Varicosities diffuse and measuring up to 2 mm in the left lower extremity Vessel Right Left  Radial Palpable Palpable                          PT Palpable Palpable  DP Palpable Palpable    Musculoskeletal: M/S 5/5 throughout.   Trace RLE edema.  1 + LLE edema Neurologic: Sensation grossly intact in extremities.  Symmetrical.  Speech is fluent.  Psychiatric: Judgment intact, Mood & affect appropriate for pt's clinical situation. Dermatologic: No rashes or ulcers noted.  No cellulitis or open wounds.  Radiology No results found.  Labs No results found for this or any previous visit (from the past 2160 hour(s)).  Assessment/Plan: Hyperlipidemia lipid control important in reducing the progression of atherosclerotic disease. Continue statin therapy   Depression On medication  Swelling of limb Likely from venous disease. Other things like lymphedema could be present as well with chronic scarring of the lymphatic channels over time.  Varicose veins of bilateral lower extremities with pain   The patient has done their best to comply with conservative therapy of 20-30 mm Hg compression stockings, leg elevation, exercise, and anti-inflammatories as needed for discomfort.  Despite this, they continue to have daily and persistent symptoms from  their venous disease.  A venous reflux study demonstrates bilateral GSV reflux.  As such, the patient is likely to benefit from endovenous laser ablation of the great saphenous vein bilaterally in a staged fashion.  Risks and benefits of the procedure including bleeding, infection, recanalization, DVT, and need for further therapy for residual varicosities were discussed.  The patient voices their understanding and is agreeable to proceed with bilateral GSV laser ablation.     Festus Barren 06/26/2017, 9:13 AM

## 2017-06-26 NOTE — Patient Instructions (Signed)
Nonsurgical Procedures for Varicose Veins, Care After This sheet gives you information about how to care for yourself after your procedure. Your health care provider may also give you more specific instructions. If you have problems or questions, contact your health care provider. What can I expect after the procedure? After the procedure, it is common to have:  Swelling.  Bruising.  Soreness.  Mild skin discoloration.  Slight bleeding at the incision sites.  Follow these instructions at home: Incision or puncture site care  Follow instructions from your health care provider about how to take care of your incision or puncture site. Make sure you: ? Wash your hands with soap and water before you change your bandage (dressing). If soap and water are not available, use hand sanitizer. ? Change your dressing as told by your health care provider. ? Leave skin glue or adhesive strips in place. These skin closures may need to stay in place for 2 weeks or longer. If adhesive strip edges start to loosen and curl up, you may trim the loose edges. Do not remove adhesive strips completely unless your health care provider tells you to do that.  Check your incision or puncture area every day for signs of infection. Check for: ? Redness, swelling, or pain. ? Fluid or blood. ? Warmth. ? Pus or a bad smell. General instructions  Take over-the-counter and prescription medicines only as told by your health care provider.  Wear compression stockings as told by your health care provider. These stockings help to prevent blood clots and reduce swelling in your legs.  Do not take baths, swim, or use a hot tub until your health care provider approves. Ask your health care provider if you can take showers.  Wear loose-fitting clothing.  Return to your normal activities as told by your health care provider. Ask your health care provider what activities are safe for you.  Get regular daily exercise. Walk  or ride a stationary bike daily or as told by your health care provider.  Keep all follow-up visits as told by your health care provider. This is important. Contact a health care provider if:  You have a fever.  You have redness, swelling, or pain around your incision or puncture site.  You have fluid or blood coming from your incision or puncture site.  Your incision or puncture site feels warm to the touch.  You have pus or a bad smell coming from your incision or puncture site.  You develop a cough. Get help right away if:  You pass out.  You have very bad pain in your leg.  You have leg pain that gets worse when you walk.  You have redness or swelling in your leg that is getting worse.  You have trouble breathing.  You cough up blood. Summary  After the procedure, it is common to have swelling, bruising, soreness, or mild skin discoloration.  Follow instructions from your health care provider about how to take care of your incision or puncture site.  Wear compression stockings as told by your health care provider. These stockings help to prevent blood clots and reduce swelling in your legs. This information is not intended to replace advice given to you by your health care provider. Make sure you discuss any questions you have with your health care provider. Document Released: 05/05/2016 Document Revised: 05/05/2016 Document Reviewed: 05/05/2016 Elsevier Interactive Patient Education  2018 [employer]vier Inc.  

## 2017-07-04 ENCOUNTER — Telehealth: Payer: Self-pay | Admitting: Family Medicine

## 2017-07-04 NOTE — Telephone Encounter (Signed)
I see a referral already in for this in January. I cannot past-date a referral, but it looks like she already has one- do you know what she needs?

## 2017-07-04 NOTE — Telephone Encounter (Signed)
Copied from CRM [id number]. Topic: Referral - Request >> Jul 04, 2017 10:51 AM Herby Abraham C wrote: Reason for CRM: pt is requesting a referral to [facility] for Jun 26, 2017 and also a referral to the same location for vein treatment.   Please assist further.

## 2017-07-05 NOTE — Telephone Encounter (Signed)
This was an authorization for insurance.  Patient's insurance changed. Could only post date to the 26th. Patient understood I may not could post date that far. It was approved for 6 visits.

## 2017-08-01 ENCOUNTER — Telehealth (INDEPENDENT_AMBULATORY_CARE_PROVIDER_SITE_OTHER): Payer: Self-pay | Admitting: Vascular Surgery

## 2017-09-01 ENCOUNTER — Other Ambulatory Visit: Payer: Self-pay | Admitting: Family Medicine

## 2017-12-11 ENCOUNTER — Other Ambulatory Visit: Payer: Self-pay | Admitting: Family Medicine

## 2017-12-11 NOTE — Telephone Encounter (Signed)
Requested Prescriptions  Pending Prescriptions Disp Refills  . atorvastatin (LIPITOR) 10 MG tablet [Pharmacy Med Name: ATORVASTATIN 10MG  TABLETS] 90 tablet 0    Sig: TAKE 1 TABLET(10 MG) BY MOUTH DAILY     Cardiovascular:  Antilipid - Statins Failed - 12/11/2017 10:08 AM      Failed - Total Cholesterol in normal range and within 360 days    Cholesterol, Total  Date Value Ref Range Status  02/26/2017 218 (H) 100 - 199 mg/dL Final         Failed - LDL in normal range and within 360 days    LDL Calculated  Date Value Ref Range Status  02/26/2017 137 (H) 0 - 99 mg/dL Final         Failed - Triglycerides in normal range and within 360 days    Triglycerides  Date Value Ref Range Status  02/26/2017 165 (H) 0 - 149 mg/dL Final         Passed - HDL in normal range and within 360 days    HDL  Date Value Ref Range Status  02/26/2017 48 >39 mg/dL Final         Passed - Patient is not pregnant      Passed - Valid encounter within last 12 months    Recent Outpatient Visits          7 months ago Acute non-recurrent maxillary sinusitis   [facility] Gabriel Cirri, NP   9 months ago Anxiety   [facility] [place], [place], [place]   1 year ago Acute sinusitis, recurrence not specified, unspecified location   [facility], Redge Gainer, MD   2 years ago Annual physical exam   [facility] Particia Nearing, [place]   2 years ago Depression   [facility] Steele Sizer, MD           pt. was started on Atorvastatin 02/2017 for elevated cholesterol; refilling # 90 with 0 refills; next CPE due 02/2018.

## 2018-02-13 ENCOUNTER — Telehealth: Payer: Self-pay | Admitting: Family Medicine

## 2018-02-13 DIAGNOSIS — I83813 Varicose veins of bilateral lower extremities with pain: Secondary | ICD-10-CM

## 2018-02-13 NOTE — Telephone Encounter (Signed)
Referral had been placed 02/2017 to [facility] - not sure why they have said I didn't refer. Please let me know what I need to do about this  Copied from CRM [id number]. Topic: General - Other >> Feb 12, 2018  2:26 PM Lynne Logan D wrote: Reason for CRM: Pt stated that she received a $360 bill from [facility] because they did not receive referral from PCP [facility] for her verucose veins surgery. Pt would like to know if referral can be sent to them so that they can resubmit her bill. Please advise. [phone]

## 2018-02-19 NOTE — Addendum Note (Signed)
Addended by: Roosvelt Maser E on: 02/19/2018 11:38 AM   Modules accepted: Orders

## 2018-02-19 NOTE — Telephone Encounter (Signed)
Referral replaced

## 2018-02-25 ENCOUNTER — Other Ambulatory Visit: Payer: Self-pay | Admitting: Family Medicine

## 2018-02-25 DIAGNOSIS — F419 Anxiety disorder, unspecified: Secondary | ICD-10-CM

## 2018-02-25 DIAGNOSIS — F329 Major depressive disorder, single episode, unspecified: Secondary | ICD-10-CM

## 2018-02-25 DIAGNOSIS — F32A Depression, unspecified: Secondary | ICD-10-CM

## 2018-02-25 NOTE — Telephone Encounter (Signed)
Requested medication (s) are due for refill today: yes  Requested medication (s) are on the active medication list: yes  Last refill:  02/26/17 for 90 and 3 refills  Future visit scheduled: yes  Notes to clinic:  Prescription expires 02/26/2018.  SNRI - desvenlafaxine & venlafaxine failed.  Requested Prescriptions  Pending Prescriptions Disp Refills   venlafaxine XR (EFFEXOR-XR) 150 MG 24 hr capsule [Pharmacy Med Name: VENLAFAXINE ER 150MG  CAPSULES] 90 capsule 3    Sig: TAKE 1 CAPSULE(150 MG) BY MOUTH DAILY     Psychiatry: Antidepressants - SNRI - desvenlafaxine & venlafaxine Failed - 02/25/2018  5:08 PM      Failed - LDL in normal range and within 360 days    LDL Calculated  Date Value Ref Range Status  02/26/2017 137 (H) 0 - 99 mg/dL Final         Failed - Total Cholesterol in normal range and within 360 days    Cholesterol, Total  Date Value Ref Range Status  02/26/2017 218 (H) 100 - 199 mg/dL Final         Failed - Triglycerides in normal range and within 360 days    Triglycerides  Date Value Ref Range Status  02/26/2017 165 (H) 0 - 149 mg/dL Final         Failed - Completed PHQ-2 or PHQ-9 in the last 360 days.      Failed - Valid encounter within last 6 months    Recent Outpatient Visits          9 months ago Acute non-recurrent maxillary sinusitis   [facility] Gabriel Cirri, NP   12 months ago Anxiety   [facility] Roosvelt Maser [place], [place]   2 years ago Acute sinusitis, recurrence not specified, unspecified location   [facility], Redge Gainer, MD   2 years ago Annual physical exam   [facility] Particia Nearing, [place]   2 years ago Depression   [facility] [place], Redge Gainer, [place]      Future Appointments            In 1 week Particia Nearing, PA-C [facility], [facility]           Passed - Last BP in normal range    BP Readings from Last 1 Encounters:  06/26/17  132/79

## 2018-03-04 ENCOUNTER — Ambulatory Visit (INDEPENDENT_AMBULATORY_CARE_PROVIDER_SITE_OTHER): Payer: 59 | Admitting: Family Medicine

## 2018-03-04 ENCOUNTER — Encounter: Payer: Self-pay | Admitting: Family Medicine

## 2018-03-04 VITALS — BP 130/87 | HR 82 | Temp 98.6°F | Ht 62.4 in | Wt 228.2 lb

## 2018-03-04 DIAGNOSIS — Z0000 Encounter for general adult medical examination without abnormal findings: Secondary | ICD-10-CM

## 2018-03-04 DIAGNOSIS — I83813 Varicose veins of bilateral lower extremities with pain: Secondary | ICD-10-CM

## 2018-03-04 DIAGNOSIS — F419 Anxiety disorder, unspecified: Secondary | ICD-10-CM

## 2018-03-04 DIAGNOSIS — Z1211 Encounter for screening for malignant neoplasm of colon: Secondary | ICD-10-CM | POA: Diagnosis not present

## 2018-03-04 DIAGNOSIS — F32A Depression, unspecified: Secondary | ICD-10-CM

## 2018-03-04 DIAGNOSIS — F329 Major depressive disorder, single episode, unspecified: Secondary | ICD-10-CM | POA: Diagnosis not present

## 2018-03-04 LAB — UA/M W/RFLX CULTURE, ROUTINE
Bilirubin, UA: NEGATIVE
Glucose, UA: NEGATIVE
Ketones, UA: NEGATIVE
Leukocytes, UA: NEGATIVE
Nitrite, UA: NEGATIVE
Protein, UA: NEGATIVE
RBC, UA: NEGATIVE
SPEC GRAV UA: 1.025 (ref 1.005–1.030)
Urobilinogen, Ur: 0.2 mg/dL (ref 0.2–1.0)
pH, UA: 6.5 (ref 5.0–7.5)

## 2018-03-04 MED ORDER — ROSUVASTATIN CALCIUM 20 MG PO TABS: 20.0000 mg | ORAL_TABLET | Freq: Every day | ORAL | 1 refills | Status: DC

## 2018-03-04 MED ORDER — NYSTATIN 100000 UNIT/GM EX CREA: 1.0000 "application " | TOPICAL_CREAM | Freq: Two times a day (BID) | CUTANEOUS | 0 refills | Status: DC

## 2018-03-04 MED ORDER — DICLOFENAC SODIUM 1 % TD GEL: 2.0000 g | Freq: Four times a day (QID) | TRANSDERMAL | 11 refills | Status: DC

## 2018-03-04 MED ORDER — VENLAFAXINE HCL ER 150 MG PO CP24: 150.0000 mg | ORAL_CAPSULE | Freq: Every day | ORAL | 1 refills | Status: DC

## 2018-03-04 NOTE — Progress Notes (Signed)
BP 130/87 (BP Location: Left Arm, Patient Position: Sitting, Cuff Size: Large)   Pulse 82   Temp 98.6 F (37 C)   Ht 5' 2.4" (1.585 m)   Wt 228 lb 3 oz (103.5 kg)   SpO2 94%   BMI 41.20 kg/m    Subjective:    Patient ID: Jill Burch, female    DOB: 06-Apr-1960, 58 y.o.   MRN: [id number]  HPI: Jill Burch is a 58 y.o. female presenting on 03/04/2018 for comprehensive medical examination. Current medical complaints include:see below  Cough for about 4 days now. Starting to become productive.   Rash popping up under breasts. Not itchy or painful. Has not been trying anything for it OTC.   The effexor helps quite a bit, under a significant amount of stress with just losing her job and this has been helping her cope. Denies SI/HI.   Thought she was getting sores from lipitor so stopped taking it.   She currently lives with: Menopausal Symptoms: no  Depression Screen done today and results listed below:  Depression screen [facility] 2/9 03/04/2018 02/26/2017 11/03/2015 09/09/2015  Decreased Interest 3 1 0 2  Down, Depressed, Hopeless 3 1 0 3  PHQ - 2 Score 6 2 0 5  Altered sleeping 3 0 - 3  Tired, decreased energy 3 3 - 3  Change in appetite 3 3 - 3  Feeling bad or failure about yourself  0 0 - 2  Trouble concentrating 1 2 - 2  Moving slowly or fidgety/restless 3 0 - 0  Suicidal thoughts 0 0 - 0  PHQ-9 Score 19 10 - 18  Difficult doing work/chores Somewhat difficult - - -    The patient does not have a history of falls. I did not complete a risk assessment for falls. A plan of care for falls was not documented.   Past Medical History:  Past Medical History:  Diagnosis Date  . Anxiety   . Depression     Surgical History:  Past Surgical History:  Procedure Laterality Date  . TUBAL LIGATION      Medications:  Current Outpatient Medications on File Prior to Visit  Medication Sig  . cyclobenzaprine (FLEXERIL) 10 MG tablet Take 1 tablet (10 mg total) by mouth 3 (three) times  daily as needed for muscle spasms.  . meloxicam (MOBIC) 15 MG tablet Take 1 tablet (15 mg total) by mouth daily.  . VENTOLIN HFA 108 (90 Base) MCG/ACT inhaler INL 2 PFS PO Q 4 H PRF COUGH OR WHZ   No current facility-administered medications on file prior to visit.     Allergies:  No Known Allergies  Social History:  Social History   Socioeconomic History  . Marital status: Married    Spouse name: Not on file  . Number of children: Not on file  . Years of education: Not on file  . Highest education level: Not on file  Occupational History  . Not on file  Social Needs  . Financial resource strain: Not on file  . Food insecurity:    Worry: Not on file    Inability: Not on file  . Transportation needs:    Medical: Not on file    Non-medical: Not on file  Tobacco Use  . Smoking status: Former Smoker    Types: Cigarettes    Last attempt to quit: 02/06/1990    Years since quitting: 28.1  . Smokeless tobacco: Never Used  Substance and Sexual Activity  .  Alcohol use: No    Alcohol/week: 0.0 standard drinks  . Drug use: No  . Sexual activity: Not on file  Lifestyle  . Physical activity:    Days per week: Not on file    Minutes per session: Not on file  . Stress: Not on file  Relationships  . Social connections:    Talks on phone: Not on file    Gets together: Not on file    Attends religious service: Not on file    Active member of club or organization: Not on file    Attends meetings of clubs or organizations: Not on file    Relationship status: Not on file  . Intimate partner violence:    Fear of current or ex partner: Not on file    Emotionally abused: Not on file    Physically abused: Not on file    Forced sexual activity: Not on file  Other Topics Concern  . Not on file  Social History Narrative  . Not on file   Social History   Tobacco Use  Smoking Status Former Smoker  . Types: Cigarettes  . Last attempt to quit: 02/06/1990  . Years since quitting: 28.1    Smokeless Tobacco Never Used   Social History   Substance and Sexual Activity  Alcohol Use No  . Alcohol/week: 0.0 standard drinks    Family History:  Family History  Problem Relation Age of Onset  . Depression Mother   . Arthritis Mother   . Heart disease Mother   . Heart attack Mother   . Cancer Father        bladder  . Diabetes Father   . Depression Father   . Hypertension Father   . Diabetes Brother   . COPD Neg Hx   . Stroke Neg Hx     Past medical history, surgical history, medications, allergies, family history and social history reviewed with patient today and changes made to appropriate areas of the chart.   Review of Systems - General ROS: negative Psychological ROS: negative Ophthalmic ROS: negative ENT ROS: negative Allergy and Immunology ROS: negative Hematological and Lymphatic ROS: negative Endocrine ROS: negative Breast ROS: negative for breast lumps Respiratory ROS: no cough, shortness of breath, or wheezing Cardiovascular ROS: positive for - varicose veins Gastrointestinal ROS: no abdominal pain, change in bowel habits, or black or bloody stools Genito-Urinary ROS: no dysuria, trouble voiding, or hematuria Musculoskeletal ROS: negative Neurological ROS: no TIA or stroke symptoms Dermatological ROS: negative All other ROS negative except what is listed above and in the HPI.      Objective:    BP 130/87 (BP Location: Left Arm, Patient Position: Sitting, Cuff Size: Large)   Pulse 82   Temp 98.6 F (37 C)   Ht 5' 2.4" (1.585 m)   Wt 228 lb 3 oz (103.5 kg)   SpO2 94%   BMI 41.20 kg/m   Wt Readings from Last 3 Encounters:  03/04/18 228 lb 3 oz (103.5 kg)  06/26/17 227 lb (103 kg)  05/15/17 228 lb 9.6 oz (103.7 kg)    Physical Exam Vitals signs and nursing note reviewed.  Constitutional:      General: She is not in acute distress.    Appearance: She is well-developed.  HENT:     Head: Atraumatic.     Right Ear: External ear normal.      Left Ear: External ear normal.     Nose: Nose normal.     Mouth/Throat:  Pharynx: No oropharyngeal exudate.  Eyes:     General: No scleral icterus.    Conjunctiva/sclera: Conjunctivae normal.     Pupils: Pupils are equal, round, and reactive to light.  Neck:     Musculoskeletal: Normal range of motion and neck supple.     Thyroid: No thyromegaly.  Cardiovascular:     Rate and Rhythm: Normal rate and regular rhythm.     Heart sounds: Normal heart sounds.  Pulmonary:     Effort: Pulmonary effort is normal. No respiratory distress.     Breath sounds: Normal breath sounds.  Chest:     Breasts:        Right: No mass, skin change or tenderness.        Left: No mass, skin change or tenderness.  Abdominal:     General: Bowel sounds are normal.     Palpations: Abdomen is soft. There is no mass.     Tenderness: There is no abdominal tenderness.  Genitourinary:    Comments: Exam declined Musculoskeletal: Normal range of motion.        General: No tenderness.  Lymphadenopathy:     Cervical: No cervical adenopathy.     Upper Body:     Right upper body: No axillary adenopathy.     Left upper body: No axillary adenopathy.  Skin:    General: Skin is warm and dry.     Findings: No rash.  Neurological:     Mental Status: She is alert and oriented to person, place, and time.     Cranial Nerves: No cranial nerve deficit.  Psychiatric:        Behavior: Behavior normal.     Results for orders placed or performed in visit on 03/04/18  CBC with Differential/Platelet  Result Value Ref Range   WBC 5.3 3.4 - 10.8 x10E3/uL   RBC 4.47 3.77 - 5.28 x10E6/uL   Hemoglobin 13.0 11.1 - 15.9 g/dL   Hematocrit 40.9 81.1 - 46.6 %   MCV 88 79 - 97 fL   MCH 29.1 26.6 - 33.0 pg   MCHC 33.2 31.5 - 35.7 g/dL   RDW 91.4 78.2 - 95.6 %   Platelets 315 150 - 450 x10E3/uL   Neutrophils 65 Not Estab. %   Lymphs 28 Not Estab. %   Monocytes 5 Not Estab. %   Eos 2 Not Estab. %   Basos 0 Not Estab. %    Neutrophils Absolute 3.4 1.4 - 7.0 x10E3/uL   Lymphocytes Absolute 1.5 0.7 - 3.1 x10E3/uL   Monocytes Absolute 0.3 0.1 - 0.9 x10E3/uL   EOS (ABSOLUTE) 0.1 0.0 - 0.4 x10E3/uL   Basophils Absolute 0.0 0.0 - 0.2 x10E3/uL   Immature Granulocytes 0 Not Estab. %   Immature Grans (Abs) 0.0 0.0 - 0.1 x10E3/uL  Comprehensive metabolic panel  Result Value Ref Range   Glucose 85 65 - 99 mg/dL   BUN 14 6 - 24 mg/dL   Creatinine, Ser 2.13 0.57 - 1.00 mg/dL   GFR calc non Af Amer 85 >59 mL/min/1.73   GFR calc Af Amer 98 >59 mL/min/1.73   BUN/Creatinine Ratio 18 9 - 23   Sodium 146 (H) 134 - 144 mmol/L   Potassium 4.2 3.5 - 5.2 mmol/L   Chloride 102 96 - 106 mmol/L   CO2 22 20 - 29 mmol/L   Calcium 10.1 8.7 - 10.2 mg/dL   Total Protein 7.4 6.0 - 8.5 g/dL   Albumin 4.6 3.8 - 4.9 g/dL  Globulin, Total 2.8 1.5 - 4.5 g/dL   Albumin/Globulin Ratio 1.6 1.2 - 2.2   Bilirubin Total <0.2 0.0 - 1.2 mg/dL   Alkaline Phosphatase 86 39 - 117 IU/L   AST 20 0 - 40 IU/L   ALT 23 0 - 32 IU/L  Lipid Panel w/o Chol/HDL Ratio  Result Value Ref Range   Cholesterol, Total 218 (H) 100 - 199 mg/dL   Triglycerides 098 (H) 0 - 149 mg/dL   HDL 45 >11 mg/dL   VLDL Cholesterol Cal 35 5 - 40 mg/dL   LDL Calculated 914 (H) 0 - 99 mg/dL  TSH  Result Value Ref Range   TSH 2.000 0.450 - 4.500 uIU/mL  UA/M w/rflx Culture, Routine  Result Value Ref Range   Specific Gravity, UA 1.025 1.005 - 1.030   pH, UA 6.5 5.0 - 7.5   Color, UA Yellow Yellow   Appearance Ur Clear Clear   Leukocytes, UA Negative Negative   Protein, UA Negative Negative/Trace   Glucose, UA Negative Negative   Ketones, UA Negative Negative   RBC, UA Negative Negative   Bilirubin, UA Negative Negative   Urobilinogen, Ur 0.2 0.2 - 1.0 mg/dL   Nitrite, UA Negative Negative      Assessment & Plan:   Problem List Items Addressed This Visit      Cardiovascular and Mediastinum   Varicose veins of bilateral lower extremities with pain    Followed  by vascular      Relevant Medications   rosuvastatin (CRESTOR) 20 MG tablet     Other   Anxiety - Primary    Stable on effexor, continue current regimen      Relevant Medications   venlafaxine XR (EFFEXOR-XR) 150 MG 24 hr capsule   Depression    Stable, continue current regimen      Relevant Medications   venlafaxine XR (EFFEXOR-XR) 150 MG 24 hr capsule    Other Visit Diagnoses    Annual physical exam       Relevant Orders   CBC with Differential/Platelet (Completed)   Comprehensive metabolic panel (Completed)   Lipid Panel w/o Chol/HDL Ratio (Completed)   TSH (Completed)   UA/M w/rflx Culture, Routine (Completed)   Screening for colon cancer       cologuard ordered   Relevant Orders   Cologuard       Follow up plan: Return in about 6 months (around 09/02/2018) for Chol, mood f/u.   LABORATORY TESTING:  - Pap smear: up to date  IMMUNIZATIONS:   - Tdap: Tetanus vaccination status reviewed: last tetanus booster within 10 years. - Influenza: Up to date  SCREENING: -Mammogram: Refused  - Colonoscopy: Refused   PATIENT COUNSELING:   Advised to take 1 mg of folate supplement per day if capable of pregnancy.   Sexuality: Discussed sexually transmitted diseases, partner selection, use of condoms, avoidance of unintended pregnancy  and contraceptive alternatives.   Advised to avoid cigarette smoking.  I discussed with the patient that most people either abstain from alcohol or drink within safe limits (<=14/week and <=4 drinks/occasion for males, <=7/weeks and <= 3 drinks/occasion for females) and that the risk for alcohol disorders and other health effects rises proportionally with the number of drinks per week and how often a drinker exceeds daily limits.  Discussed cessation/primary prevention of drug use and availability of treatment for abuse.   Diet: Encouraged to adjust caloric intake to maintain  or achieve ideal body weight, to reduce intake of dietary  saturated fat and total fat, to limit sodium intake by avoiding high sodium foods and not adding table salt, and to maintain adequate dietary potassium and calcium preferably from fresh fruits, vegetables, and low-fat dairy products.    stressed the importance of regular exercise  Injury prevention: Discussed safety belts, safety helmets, smoke detector, smoking near bedding or upholstery.   Dental health: Discussed importance of regular tooth brushing, flossing, and dental visits.    NEXT PREVENTATIVE PHYSICAL DUE IN 1 YEAR. Return in about 6 months (around 09/02/2018) for Chol, mood f/u.

## 2018-03-05 ENCOUNTER — Encounter: Payer: Self-pay | Admitting: Family Medicine

## 2018-03-05 LAB — LIPID PANEL W/O CHOL/HDL RATIO
Cholesterol, Total: 218 mg/dL — ABNORMAL HIGH (ref 100–199)
HDL: 45 mg/dL (ref >39)
LDL Calculated: 138 mg/dL — ABNORMAL HIGH (ref 0–99)
Triglycerides: 173 mg/dL — ABNORMAL HIGH (ref 0–149)
VLDL Cholesterol Cal: 35 mg/dL (ref 5–40)

## 2018-03-05 LAB — COMPREHENSIVE METABOLIC PANEL
ALT: 23 IU/L (ref 0–32)
AST: 20 IU/L (ref 0–40)
Albumin/Globulin Ratio: 1.6 (ref 1.2–2.2)
Albumin: 4.6 g/dL (ref 3.8–4.9)
Alkaline Phosphatase: 86 IU/L (ref 39–117)
BUN/Creatinine Ratio: 18 (ref 9–23)
BUN: 14 mg/dL (ref 6–24)
Bilirubin Total: <0.2 mg/dL (ref 0.0–1.2)
CALCIUM: 10.1 mg/dL (ref 8.7–10.2)
CO2: 22 mmol/L (ref 20–29)
Chloride: 102 mmol/L (ref 96–106)
Creatinine, Ser: 0.77 mg/dL (ref 0.57–1.00)
GFR calc Af Amer: 98 mL/min/{1.73_m2} (ref >59)
GFR calc non Af Amer: 85 mL/min/{1.73_m2} (ref >59)
Globulin, Total: 2.8 g/dL (ref 1.5–4.5)
Glucose: 85 mg/dL (ref 65–99)
Potassium: 4.2 mmol/L (ref 3.5–5.2)
Sodium: 146 mmol/L — ABNORMAL HIGH (ref 134–144)
TOTAL PROTEIN: 7.4 g/dL (ref 6.0–8.5)

## 2018-03-05 LAB — CBC WITH DIFFERENTIAL/PLATELET
BASOS: 0 %
Basophils Absolute: 0 10*3/uL (ref 0.0–0.2)
EOS (ABSOLUTE): 0.1 10*3/uL (ref 0.0–0.4)
EOS: 2 %
HEMATOCRIT: 39.1 % (ref 34.0–46.6)
Hemoglobin: 13 g/dL (ref 11.1–15.9)
IMMATURE GRANULOCYTES: 0 %
Immature Grans (Abs): 0 10*3/uL (ref 0.0–0.1)
LYMPHS ABS: 1.5 10*3/uL (ref 0.7–3.1)
Lymphs: 28 %
MCH: 29.1 pg (ref 26.6–33.0)
MCHC: 33.2 g/dL (ref 31.5–35.7)
MCV: 88 fL (ref 79–97)
MONOS ABS: 0.3 10*3/uL (ref 0.1–0.9)
Monocytes: 5 %
NEUTROS ABS: 3.4 10*3/uL (ref 1.4–7.0)
NEUTROS PCT: 65 %
Platelets: 315 10*3/uL (ref 150–450)
RBC: 4.47 x10E6/uL (ref 3.77–5.28)
RDW: 13.1 % (ref 11.7–15.4)
WBC: 5.3 10*3/uL (ref 3.4–10.8)

## 2018-03-05 LAB — TSH: TSH: 2 u[IU]/mL (ref 0.450–4.500)

## 2018-03-08 NOTE — Assessment & Plan Note (Signed)
Followed by vascular 

## 2018-03-08 NOTE — Assessment & Plan Note (Signed)
Stable on effexor, continue current regimen 

## 2018-03-08 NOTE — Assessment & Plan Note (Signed)
Stable, continue current regimen 

## 2018-03-18 ENCOUNTER — Telehealth: Payer: Self-pay | Admitting: Family Medicine

## 2018-03-18 DIAGNOSIS — I83813 Varicose veins of bilateral lower extremities with pain: Secondary | ICD-10-CM

## 2018-03-18 NOTE — Telephone Encounter (Signed)
I have placed yet another referral (this is the 3rd time now), please let me know if there is something I need to be doing differently. I was told previously she was seeing [facility] so the first two got routed there, this one I placed externally to the stated provider - I'm not sure what the confusion is here but let me know what I can do to resolve the issue

## 2018-03-18 NOTE — Telephone Encounter (Signed)
Patient calling back regarding referral to [facility]- stating they are still having issues with referral and requesting it be resent.

## 2018-03-18 NOTE — Telephone Encounter (Signed)
Pt is calling and has an appt with [facility]  [facility] today at 4pm . Pt is calling to make sure the referrals are in place

## 2018-03-18 NOTE — Telephone Encounter (Signed)
I'm truly confused as to what is happening with this situation - this will now be the 3rd time in 12 months I am placing a referral to vascular for her and something keeps happening to the referral.   Copied from CRM #[id number]. Topic: Referral - Request for Referral >> Mar 18, 2018  1:13 PM Herby AbrahamJohnson, Shiquita C wrote: Pt's insurance is calling in to make provider aware that [facility] by Dr. Stark BrayLynda McHutchison. Pt's insurance [employer] is requesting to have referral placed for pt in order to see provider.    Pt is being seen today.

## 2018-03-18 NOTE — Telephone Encounter (Signed)
See previous phone encounters already sent today

## 2018-03-18 NOTE — Telephone Encounter (Signed)
Copied from CRM [id number]. Topic: Quick Communication - See Telephone Encounter >> Mar 18, 2018 12:24 PM Jens Som A wrote: CRM for notification. See Telephone encounter for: 03/18/18.  Christine calling from [facility] is calling to request if the 2 referrals that were placed for [facility] can be sent to them? Need to placed into provider portal system. [email or web address] Please [phone]

## 2018-03-22 NOTE — Telephone Encounter (Signed)
Fleet Contras,  Thank you for the feedback. I am having the workflow reviewed by the IT team and will let you know of their findings.

## 2018-05-09 ENCOUNTER — Telehealth: Payer: Self-pay

## 2018-05-09 NOTE — Telephone Encounter (Signed)
Tried to call patient to remind her about completing the cologuard test order as [employer] have faxed [place] stating that patient has not completed the test yet. Patient did not answer and phone keep ringing multiple times. Unable to leave a message. FYI

## 2018-09-03 ENCOUNTER — Ambulatory Visit: Payer: 59 | Admitting: Family Medicine

## 2019-04-10 ENCOUNTER — Other Ambulatory Visit: Payer: Self-pay | Admitting: Orthopedic Surgery

## 2019-04-10 DIAGNOSIS — R29898 Other symptoms and signs involving the musculoskeletal system: Secondary | ICD-10-CM

## 2019-04-10 DIAGNOSIS — M47812 Spondylosis without myelopathy or radiculopathy, cervical region: Secondary | ICD-10-CM

## 2019-04-10 DIAGNOSIS — M5030 Other cervical disc degeneration, unspecified cervical region: Secondary | ICD-10-CM

## 2019-04-10 DIAGNOSIS — M5412 Radiculopathy, cervical region: Secondary | ICD-10-CM

## 2020-06-20 LAB — COLOGUARD

## 2020-06-24 ENCOUNTER — Other Ambulatory Visit: Payer: Self-pay | Admitting: Surgery

## 2020-06-24 DIAGNOSIS — M1712 Unilateral primary osteoarthritis, left knee: Secondary | ICD-10-CM

## 2020-07-19 ENCOUNTER — Other Ambulatory Visit: Payer: Self-pay

## 2020-07-19 ENCOUNTER — Ambulatory Visit
Admission: RE | Admit: 2020-07-19 | Discharge: 2020-07-19 | Disposition: A | Discharge status: Home / Self Care | Payer: 59 | Source: Ambulatory Visit | Attending: Surgery | Admitting: Surgery

## 2020-07-19 DIAGNOSIS — M1712 Unilateral primary osteoarthritis, left knee: Secondary | ICD-10-CM | POA: Diagnosis not present

## 2020-08-16 ENCOUNTER — Ambulatory Visit: Payer: Self-pay

## 2020-08-16 NOTE — Telephone Encounter (Signed)
Reason for Disposition  General information question, no triage required and triager able to answer question  Answer Assessment - Initial Assessment Questions 1. REASON FOR CALL or QUESTION: "What is your reason for calling today?" or "How can I best help you?" or "What question do you have that I can help answer?"     Can she take her medication before she does for a physical exam in the evening.  Protocols used: Information Only Call - No Triage-A-AH Pt advised she can take her usual medication before her CPE. Pt verbalized understanding.

## 2020-08-25 ENCOUNTER — Other Ambulatory Visit: Payer: Self-pay

## 2020-08-25 ENCOUNTER — Other Ambulatory Visit (HOSPITAL_COMMUNITY)
Admission: RE | Admit: 2020-08-25 | Discharge: 2020-08-25 | Disposition: A | Discharge status: Home / Self Care | Payer: 59 | Source: Ambulatory Visit | Attending: Nurse Practitioner | Admitting: Nurse Practitioner

## 2020-08-25 ENCOUNTER — Encounter: Payer: Self-pay | Admitting: Nurse Practitioner

## 2020-08-25 ENCOUNTER — Ambulatory Visit (INDEPENDENT_AMBULATORY_CARE_PROVIDER_SITE_OTHER): Payer: Medicare Other | Admitting: Nurse Practitioner

## 2020-08-25 VITALS — BP 120/78 | HR 70 | Temp 98.2°F | Ht 62.0 in | Wt 223.1 lb

## 2020-08-25 DIAGNOSIS — F419 Anxiety disorder, unspecified: Secondary | ICD-10-CM | POA: Diagnosis not present

## 2020-08-25 DIAGNOSIS — Z0000 Encounter for general adult medical examination without abnormal findings: Secondary | ICD-10-CM | POA: Diagnosis not present

## 2020-08-25 DIAGNOSIS — E785 Hyperlipidemia, unspecified: Secondary | ICD-10-CM | POA: Diagnosis not present

## 2020-08-25 DIAGNOSIS — F32A Depression, unspecified: Secondary | ICD-10-CM | POA: Diagnosis not present

## 2020-08-25 DIAGNOSIS — Z124 Encounter for screening for malignant neoplasm of cervix: Secondary | ICD-10-CM

## 2020-08-25 DIAGNOSIS — Z1231 Encounter for screening mammogram for malignant neoplasm of breast: Secondary | ICD-10-CM

## 2020-08-25 MED ORDER — VENLAFAXINE HCL ER 150 MG PO CP24: 150.0000 mg | ORAL_CAPSULE | Freq: Every day | ORAL | 1 refills | Status: DC

## 2020-08-25 MED ORDER — ROSUVASTATIN CALCIUM 20 MG PO TABS: 20.0000 mg | ORAL_TABLET | Freq: Every day | ORAL | 1 refills | Status: DC

## 2020-08-25 NOTE — Progress Notes (Signed)
BP 120/78   Pulse 70   Temp 98.2 F (36.8 C)   Ht 5\' 2"  (1.575 m)   Wt 223 lb 2 oz (101.2 kg)   SpO2 96%   BMI 40.81 kg/m    Subjective:    Patient ID: , female    DOB: [birth year], 60 y.o.   MRN: [id number]  HPI: Jill Burch is a 60 y.o. female presenting on 08/25/2020 for comprehensive medical examination. Current medical complaints include:none  She currently lives with: Menopausal Symptoms: no  HYPERLIPIDEMIA Hyperlipidemia status: poor compliance Satisfied with current treatment?  no Side effects:  no Medication compliance: poor compliance Past cholesterol meds: rosuvastatin (crestor) Supplements: none Aspirin:  no The 10-year ASCVD risk score 08/27/2020 DC Jr., et al., 2013) is: 4.3%   Values used to calculate the score:     Age: 64 years     Sex: Female     Is Non-Hispanic African American: No     Diabetic: No     Tobacco smoker: No     Systolic Blood Pressure: 120 mmHg     Is BP treated: No     HDL Cholesterol: 45 mg/dL     Total Cholesterol: 218 mg/dL Chest pain:  no Coronary artery disease:  no Family history CAD:  no Family history early CAD:  no  Depression Screen done today and results listed below:  Depression screen [facility] 2/9 08/25/2020 03/04/2018 02/26/2017 11/03/2015 09/09/2015  Decreased Interest 1 3 1  0 2  Down, Depressed, Hopeless 0 3 1 0 3  PHQ - 2 Score 1 6 2  0 5  Altered sleeping 3 3 0 - 3  Tired, decreased energy 3 3 3  - 3  Change in appetite 2 3 3  - 3  Feeling bad or failure about yourself  0 0 0 - 2  Trouble concentrating 2 1 2  - 2  Moving slowly or fidgety/restless 0 3 0 - 0  Suicidal thoughts 0 0 0 - 0  PHQ-9 Score 11 19 10  - 18  Difficult doing work/chores Not difficult at all Somewhat difficult - - -    The patient does not have a history of falls. I did complete a risk assessment for falls. A plan of care for falls was documented.   Past Medical History:  Past Medical History:  Diagnosis Date   Anxiety    Depression      Surgical History:  Past Surgical History:  Procedure Laterality Date   TUBAL LIGATION      Medications:  Current Outpatient Medications on File Prior to Visit  Medication Sig   acetaminophen (TYLENOL) 500 MG tablet Take 500 mg by mouth every 6 (six) hours as needed.   gabapentin (NEURONTIN) 300 MG capsule Take 300 mg by mouth 2 (two) times daily.   diclofenac sodium (VOLTAREN) 1 % GEL Apply 2 g topically 4 (four) times daily.   famotidine (PEPCID) 20 MG tablet famotidine 20 mg tablet  TAKE 1 TABLET BY MOUTH TWICE DAILY AS NEEDED FOR HEARTBURN   No current facility-administered medications on file prior to visit.    Allergies:  No Known Allergies  Social History:  Social History   Socioeconomic History   Marital status: Married    Spouse name: Not on file   Number of children: Not on file   Years of education: Not on file   Highest education level: Not on file  Occupational History   Not on file  Tobacco Use   Smoking  status: Former    Types: Cigarettes    Quit date: 02/06/1990    Years since quitting: 30.5   Smokeless tobacco: Never  Substance and Sexual Activity   Alcohol use: No    Alcohol/week: 0.0 standard drinks   Drug use: No   Sexual activity: Not on file  Other Topics Concern   Not on file  Social History Narrative   Not on file   Social Determinants of Health   Financial Resource Strain: Not on file  Food Insecurity: Not on file  Transportation Needs: Not on file  Physical Activity: Not on file  Stress: Not on file  Social Connections: Not on file  Intimate Partner Violence: Not on file   Social History   Tobacco Use  Smoking Status Former   Types: Cigarettes   Quit date: 02/06/1990   Years since quitting: 30.5  Smokeless Tobacco Never   Social History   Substance and Sexual Activity  Alcohol Use No   Alcohol/week: 0.0 standard drinks    Family History:  Family History  Problem Relation Age of Onset   Depression Mother     Arthritis Mother    Heart disease Mother    Heart attack Mother    Cancer Father        bladder   Diabetes Father    Depression Father    Hypertension Father    Diabetes Brother    COPD Neg Hx    Stroke Neg Hx     Past medical history, surgical history, medications, allergies, family history and social history reviewed with patient today and changes made to appropriate areas of the chart.   Review of Systems  Eyes:  Negative for blurred vision and double vision.  Respiratory:  Negative for shortness of breath.   Cardiovascular:  Negative for chest pain, palpitations and leg swelling.  Neurological:  Negative for dizziness and headaches.  All other ROS negative except what is listed above and in the HPI.      Objective:    BP 120/78   Pulse 70   Temp 98.2 F (36.8 C)   Ht  (1.575 m)   Wt 223 lb 2 oz (101.2 kg)   SpO2 96%   BMI 40.81 kg/m   Wt Readings from Last 3 Encounters:  08/25/20 223 lb 2 oz (101.2 kg)  03/04/18 228 lb 3 oz (103.5 kg)  06/26/17 227 lb (103 kg)    Physical Exam Vitals and nursing note reviewed. Exam conducted with a chaperone present (Tiffany Reel, CMA).  Constitutional:      General: She is awake. She is not in acute distress.    Appearance: She is well-developed. She is obese. She is not ill-appearing.  HENT:     Head: Normocephalic and atraumatic.     Right Ear: Hearing, tympanic membrane, ear canal and external ear normal. No drainage.     Left Ear: Hearing, tympanic membrane, ear canal and external ear normal. No drainage.     Nose: Nose normal.     Right Sinus: No maxillary sinus tenderness or frontal sinus tenderness.     Left Sinus: No maxillary sinus tenderness or frontal sinus tenderness.     Mouth/Throat:     Mouth: Mucous membranes are moist.     Pharynx: Oropharynx is clear. Uvula midline. No pharyngeal swelling, oropharyngeal exudate or posterior oropharyngeal erythema.  Eyes:     General: Lids are normal.        Right  eye: No discharge.  Left eye: No discharge.     Extraocular Movements: Extraocular movements intact.     Conjunctiva/sclera: Conjunctivae normal.     Pupils: Pupils are equal, round, and reactive to light.     Visual Fields: Right eye visual fields normal and left eye visual fields normal.  Neck:     Thyroid: No thyromegaly.     Vascular: No carotid bruit.     Trachea: Trachea normal.  Cardiovascular:     Rate and Rhythm: Normal rate and regular rhythm.     Heart sounds: Normal heart sounds. No murmur heard.   No gallop.  Pulmonary:     Effort: Pulmonary effort is normal. No accessory muscle usage or respiratory distress.     Breath sounds: Normal breath sounds.  Chest:  Breasts:    Right: Normal. No axillary adenopathy or supraclavicular adenopathy.     Left: Normal. No axillary adenopathy or supraclavicular adenopathy.  Abdominal:     General: Bowel sounds are normal.     Palpations: Abdomen is soft. There is no hepatomegaly or splenomegaly.     Tenderness: There is no abdominal tenderness.  Genitourinary:    Vagina: Normal.     Cervix: Normal.     Adnexa: Right adnexa normal and left adnexa normal.  Musculoskeletal:        General: Normal range of motion.     Cervical back: Normal range of motion and neck supple.     Right lower leg: No edema.     Left lower leg: No edema.  Lymphadenopathy:     Head:     Right side of head: No submental, submandibular, tonsillar, preauricular or posterior auricular adenopathy.     Left side of head: No submental, submandibular, tonsillar, preauricular or posterior auricular adenopathy.     Cervical: No cervical adenopathy.     Upper Body:     Right upper body: No supraclavicular, axillary or pectoral adenopathy.     Left upper body: No supraclavicular, axillary or pectoral adenopathy.  Skin:    General: Skin is warm and dry.     Capillary Refill: Capillary refill takes less than 2 seconds.     Findings: No rash.  Neurological:      Mental Status: She is alert and oriented to person, place, and time.     Cranial Nerves: Cranial nerves are intact.     Gait: Gait is intact.     Deep Tendon Reflexes: Reflexes are normal and symmetric.     Reflex Scores:      Brachioradialis reflexes are 2+ on the right side and 2+ on the left side.      Patellar reflexes are 2+ on the right side and 2+ on the left side. Psychiatric:        Attention and Perception: Attention normal.        Mood and Affect: Mood normal.        Speech: Speech normal.        Behavior: Behavior normal. Behavior is cooperative.        Thought Content: Thought content normal.        Judgment: Judgment normal.    Results for orders placed or performed in visit on 03/04/18  CBC with Differential/Platelet  Result Value Ref Range   WBC 5.3 3.4 - 10.8 x10E3/uL   RBC 4.47 3.77 - 5.28 x10E6/uL   Hemoglobin 13.0 11.1 - 15.9 g/dL   Hematocrit 95.239.1 84.134.0 - 46.6 %   MCV 88 79 - 97 fL  MCH 29.1 26.6 - 33.0 pg   MCHC 33.2 31.5 - 35.7 g/dL   RDW 62.6 94.8 - 54.6 %   Platelets 315 150 - 450 x10E3/uL   Neutrophils 65 Not Estab. %   Lymphs 28 Not Estab. %   Monocytes 5 Not Estab. %   Eos 2 Not Estab. %   Basos 0 Not Estab. %   Neutrophils Absolute 3.4 1.4 - 7.0 x10E3/uL   Lymphocytes Absolute 1.5 0.7 - 3.1 x10E3/uL   Monocytes Absolute 0.3 0.1 - 0.9 x10E3/uL   EOS (ABSOLUTE) 0.1 0.0 - 0.4 x10E3/uL   Basophils Absolute 0.0 0.0 - 0.2 x10E3/uL   Immature Granulocytes 0 Not Estab. %   Immature Grans (Abs) 0.0 0.0 - 0.1 x10E3/uL  Comprehensive metabolic panel  Result Value Ref Range   Glucose 85 65 - 99 mg/dL   BUN 14 6 - 24 mg/dL   Creatinine, Ser 2.70 0.57 - 1.00 mg/dL   GFR calc non Af Amer 85 >59 mL/min/1.73   GFR calc Af Amer 98 >59 mL/min/1.73   BUN/Creatinine Ratio 18 9 - 23   Sodium 146 (H) 134 - 144 mmol/L   Potassium 4.2 3.5 - 5.2 mmol/L   Chloride 102 96 - 106 mmol/L   CO2 22 20 - 29 mmol/L   Calcium 10.1 8.7 - 10.2 mg/dL   Total Protein  7.4 6.0 - 8.5 g/dL   Albumin 4.6 3.8 - 4.9 g/dL   Globulin, Total 2.8 1.5 - 4.5 g/dL   Albumin/Globulin Ratio 1.6 1.2 - 2.2   Bilirubin Total <0.2 0.0 - 1.2 mg/dL   Alkaline Phosphatase 86 39 - 117 IU/L   AST 20 0 - 40 IU/L   ALT 23 0 - 32 IU/L  Lipid Panel w/o Chol/HDL Ratio  Result Value Ref Range   Cholesterol, Total 218 (H) 100 - 199 mg/dL   Triglycerides 350 (H) 0 - 149 mg/dL   HDL 45 >09 mg/dL   VLDL Cholesterol Cal 35 5 - 40 mg/dL   LDL Calculated 381 (H) 0 - 99 mg/dL  TSH  Result Value Ref Range   TSH 2.000 0.450 - 4.500 uIU/mL  UA/M w/rflx Culture, Routine   Specimen: Urine   URINE  Result Value Ref Range   Specific Gravity, UA 1.025 1.005 - 1.030   pH, UA 6.5 5.0 - 7.5   Color, UA Yellow Yellow   Appearance Ur Clear Clear   Leukocytes, UA Negative Negative   Protein, UA Negative Negative/Trace   Glucose, UA Negative Negative   Ketones, UA Negative Negative   RBC, UA Negative Negative   Bilirubin, UA Negative Negative   Urobilinogen, Ur 0.2 0.2 - 1.0 mg/dL   Nitrite, UA Negative Negative      Assessment & Plan:   Problem List Items Addressed This Visit       Other   Anxiety    Chronic.  Controlled.  Continue with current medication regimen of Effexor 150mg  daily.  Labs ordered today.  Return to clinic in 6 months for reevaluation.  Call sooner if concerns arise. Refills sent today.        Relevant Medications   venlafaxine XR (EFFEXOR-XR) 150 MG 24 hr capsule   Depression    Chronic.  Controlled.  Continue with current medication regimen of Effexor 150mg  daily.  Labs ordered today.  Return to clinic in 6 months for reevaluation.  Call sooner if concerns arise. Refills sent today.         Relevant  Medications   venlafaxine XR (EFFEXOR-XR) 150 MG 24 hr capsule   Hyperlipidemia    Chronic. Reviewed ASCVD risk score.  Recommend patient take Crestor 20mg  once daily. Sent to the pharmacy for patient during visit. Follow up in 6 months.       Relevant  Medications   rosuvastatin (CRESTOR) 20 MG tablet   Other Relevant Orders   Lipid panel   Other Visit Diagnoses     Annual physical exam    -  Primary   Health maintenance reviewed today. Labs ordered today. Up to date on vaccines. Plans to get her COVId booster. PAP done today.    Relevant Orders   CBC with Differential/Platelet   Comprehensive metabolic panel   Lipid panel   TSH   Urinalysis, Routine w reflex microscopic   Cytology - PAP   MM Digital Screening   Encounter for screening mammogram for malignant neoplasm of breast       Relevant Orders   MM Digital Screening   Screening for malignant neoplasm of cervix       PAP obtained in normal fashion. Patient tolerated obtaining of PAP specimen without complication. Escorted by , CMA.   Relevant Orders   Cytology - PAP        Follow up plan: Return in about 6 months (around 02/25/2021) for HTN, HLD, DM2 FU.   LABORATORY TESTING:  - Pap smear: pap done  IMMUNIZATIONS:   - Tdap: Tetanus vaccination status reviewed: last tetanus booster within 10 years. - Influenza: Up to date - Pneumovax: Not applicable - Prevnar: Not applicable - HPV: Not applicable - Zostavax vaccine: Up to date  SCREENING: -Mammogram: Ordered today  - Colonoscopy:  has Cologaurd at home   - Bone Density: Not applicable  -Hearing Test: Not applicable  -Spirometry: Not applicable   PATIENT COUNSELING:   Advised to take 1 mg of folate supplement per day if capable of pregnancy.   Sexuality: Discussed sexually transmitted diseases, partner selection, use of condoms, avoidance of unintended pregnancy  and contraceptive alternatives.   Advised to avoid cigarette smoking.  I discussed with the patient that most people either abstain from alcohol or drink within safe limits (<=14/week and <=4 drinks/occasion for males, <=7/weeks and <= 3 drinks/occasion for females) and that the risk for alcohol disorders and other health effects rises  proportionally with the number of drinks per week and how often a drinker exceeds daily limits.  Discussed cessation/primary prevention of drug use and availability of treatment for abuse.   Diet: Encouraged to adjust caloric intake to maintain  or achieve ideal body weight, to reduce intake of dietary saturated fat and total fat, to limit sodium intake by avoiding high sodium foods and not adding table salt, and to maintain adequate dietary potassium and calcium preferably from fresh fruits, vegetables, and low-fat dairy products.    stressed the importance of regular exercise  Injury prevention: Discussed safety belts, safety helmets, smoke detector, smoking near bedding or upholstery.   Dental health: Discussed importance of regular tooth brushing, flossing, and dental visits.    NEXT PREVENTATIVE PHYSICAL DUE IN 1 YEAR. Return in about 6 months (around 02/25/2021) for HTN, HLD, DM2 FU.

## 2020-08-25 NOTE — Assessment & Plan Note (Signed)
Chronic. Reviewed ASCVD risk score.  Recommend patient take Crestor 20mg  once daily. Sent to the pharmacy for patient during visit. Follow up in 6 months.

## 2020-08-26 NOTE — Assessment & Plan Note (Signed)
Chronic.  Controlled.  Continue with current medication regimen of Effexor 150mg  daily.  Labs ordered today.  Return to clinic in 6 months for reevaluation.  Call sooner if concerns arise. Refills sent today.

## 2020-08-26 NOTE — Assessment & Plan Note (Signed)
Chronic.  Controlled.  Continue with current medication regimen of Effexor 150mg daily.  Labs ordered today.  Return to clinic in 6 months for reevaluation.  Call sooner if concerns arise. Refills sent today.  

## 2020-08-27 LAB — CYTOLOGY - PAP
Adequacy: ABSENT
Diagnosis: NEGATIVE

## 2020-08-27 NOTE — Progress Notes (Signed)
Please let patient know her PAP was normal. We will repeat it in 3 years.

## 2020-09-01 ENCOUNTER — Other Ambulatory Visit: Payer: Medicare Other

## 2020-09-01 ENCOUNTER — Other Ambulatory Visit: Payer: Self-pay

## 2020-09-01 DIAGNOSIS — E785 Hyperlipidemia, unspecified: Secondary | ICD-10-CM | POA: Diagnosis not present

## 2020-09-01 DIAGNOSIS — Z0000 Encounter for general adult medical examination without abnormal findings: Secondary | ICD-10-CM | POA: Diagnosis not present

## 2020-09-01 LAB — URINALYSIS, ROUTINE W REFLEX MICROSCOPIC
Bilirubin, UA: NEGATIVE
Glucose, UA: NEGATIVE
Ketones, UA: NEGATIVE
Leukocytes,UA: NEGATIVE
Nitrite, UA: NEGATIVE
Protein,UA: NEGATIVE
RBC, UA: NEGATIVE
Specific Gravity, UA: 1.015 (ref 1.005–1.030)
Urobilinogen, Ur: 1 mg/dL (ref 0.2–1.0)
pH, UA: 7 (ref 5.0–7.5)

## 2020-09-02 LAB — CBC WITH DIFFERENTIAL/PLATELET
Basophils Absolute: 0 10*3/uL (ref 0.0–0.2)
Basos: 0 %
EOS (ABSOLUTE): 0.1 10*3/uL (ref 0.0–0.4)
Eos: 2 %
Hematocrit: 38.6 % (ref 34.0–46.6)
Hemoglobin: 12.8 g/dL (ref 11.1–15.9)
Immature Grans (Abs): 0 10*3/uL (ref 0.0–0.1)
Immature Granulocytes: 0 %
Lymphocytes Absolute: 1.8 10*3/uL (ref 0.7–3.1)
Lymphs: 31 %
MCH: 29.5 pg (ref 26.6–33.0)
MCHC: 33.2 g/dL (ref 31.5–35.7)
MCV: 89 fL (ref 79–97)
Monocytes Absolute: 0.4 10*3/uL (ref 0.1–0.9)
Monocytes: 6 %
Neutrophils Absolute: 3.6 10*3/uL (ref 1.4–7.0)
Neutrophils: 61 %
Platelets: 294 10*3/uL (ref 150–450)
RBC: 4.34 x10E6/uL (ref 3.77–5.28)
RDW: 13 % (ref 11.7–15.4)
WBC: 5.9 10*3/uL (ref 3.4–10.8)

## 2020-09-02 LAB — COMPREHENSIVE METABOLIC PANEL
ALT: 19 IU/L (ref 0–32)
AST: 20 IU/L (ref 0–40)
Albumin/Globulin Ratio: 1.8 (ref 1.2–2.2)
Albumin: 4.4 g/dL (ref 3.8–4.8)
Alkaline Phosphatase: 78 IU/L (ref 44–121)
BUN/Creatinine Ratio: 15 (ref 12–28)
BUN: 12 mg/dL (ref 8–27)
Bilirubin Total: 0.2 mg/dL (ref 0.0–1.2)
CO2: 26 mmol/L (ref 20–29)
Calcium: 9.4 mg/dL (ref 8.7–10.3)
Chloride: 102 mmol/L (ref 96–106)
Creatinine, Ser: 0.82 mg/dL (ref 0.57–1.00)
Globulin, Total: 2.4 g/dL (ref 1.5–4.5)
Glucose: 84 mg/dL (ref 65–99)
Potassium: 4.3 mmol/L (ref 3.5–5.2)
Sodium: 141 mmol/L (ref 134–144)
Total Protein: 6.8 g/dL (ref 6.0–8.5)
eGFR: 81 mL/min/{1.73_m2} (ref >59)

## 2020-09-02 LAB — LIPID PANEL
Chol/HDL Ratio: 5 ratio — ABNORMAL HIGH (ref 0.0–4.4)
Cholesterol, Total: 258 mg/dL — ABNORMAL HIGH (ref 100–199)
HDL: 52 mg/dL (ref >39)
LDL Chol Calc (NIH): 171 mg/dL — ABNORMAL HIGH (ref 0–99)
Triglycerides: 191 mg/dL — ABNORMAL HIGH (ref 0–149)
VLDL Cholesterol Cal: 35 mg/dL (ref 5–40)

## 2020-09-02 LAB — TSH: TSH: 1.75 u[IU]/mL (ref 0.450–4.500)

## 2020-09-02 NOTE — Progress Notes (Signed)
Please let patient know that her lab work looks pretty good.  Cholesterol is elevated from prior. I recommend she continue with her Crestor and follow a low fat diet and exercise.  Otherwise, continue with current regimen.  Follow up as discussed.

## 2020-09-16 ENCOUNTER — Other Ambulatory Visit: Payer: Self-pay | Admitting: Surgery

## 2020-09-17 LAB — COLOGUARD: COLOGUARD: NEGATIVE

## 2020-09-23 ENCOUNTER — Other Ambulatory Visit: Payer: Self-pay

## 2020-09-23 DIAGNOSIS — Z1231 Encounter for screening mammogram for malignant neoplasm of breast: Secondary | ICD-10-CM

## 2020-10-04 ENCOUNTER — Encounter
Admission: RE | Admit: 2020-10-04 | Discharge: 2020-10-04 | Disposition: A | Discharge status: Home / Self Care | Payer: 59 | Source: Ambulatory Visit | Attending: Surgery | Admitting: Surgery

## 2020-10-04 ENCOUNTER — Other Ambulatory Visit: Payer: Self-pay

## 2020-10-04 DIAGNOSIS — Z01818 Encounter for other preprocedural examination: Secondary | ICD-10-CM | POA: Insufficient documentation

## 2020-10-04 DIAGNOSIS — Z01810 Encounter for preprocedural cardiovascular examination: Secondary | ICD-10-CM | POA: Diagnosis not present

## 2020-10-04 HISTORY — DX: Nausea with vomiting, unspecified: R11.2

## 2020-10-04 HISTORY — DX: Prediabetes: R73.03

## 2020-10-04 HISTORY — DX: Unspecified osteoarthritis, unspecified site: M19.90

## 2020-10-04 HISTORY — DX: Hyperlipidemia, unspecified: E78.5

## 2020-10-04 HISTORY — DX: Other specified postprocedural states: Z98.890

## 2020-10-04 LAB — COMPREHENSIVE METABOLIC PANEL
ALT: 20 U/L (ref 0–44)
AST: 22 U/L (ref 15–41)
Albumin: 4 g/dL (ref 3.5–5.0)
Alkaline Phosphatase: 65 U/L (ref 38–126)
Anion gap: 7 (ref 5–15)
BUN: 15 mg/dL (ref 8–23)
CO2: 31 mmol/L (ref 22–32)
Calcium: 9.4 mg/dL (ref 8.9–10.3)
Chloride: 104 mmol/L (ref 98–111)
Creatinine, Ser: 0.85 mg/dL (ref 0.44–1.00)
GFR, Estimated: >60 mL/min (ref >60)
Glucose, Bld: 107 mg/dL — ABNORMAL HIGH (ref 70–99)
Potassium: 3.8 mmol/L (ref 3.5–5.1)
Sodium: 142 mmol/L (ref 135–145)
Total Bilirubin: 0.4 mg/dL (ref 0.3–1.2)
Total Protein: 7.2 g/dL (ref 6.5–8.1)

## 2020-10-04 LAB — TYPE AND SCREEN
ABO/RH(D): B POS
Antibody Screen: NEGATIVE

## 2020-10-04 LAB — URINALYSIS, ROUTINE W REFLEX MICROSCOPIC
Bilirubin Urine: NEGATIVE
Glucose, UA: NEGATIVE mg/dL
Hgb urine dipstick: NEGATIVE
Ketones, ur: NEGATIVE mg/dL
Leukocytes,Ua: NEGATIVE
Nitrite: NEGATIVE
Protein, ur: NEGATIVE mg/dL
Specific Gravity, Urine: 1.014 (ref 1.005–1.030)
pH: 7 (ref 5.0–8.0)

## 2020-10-04 LAB — CBC WITH DIFFERENTIAL/PLATELET
Abs Immature Granulocytes: 0.01 10*3/uL (ref 0.00–0.07)
Basophils Absolute: 0 10*3/uL (ref 0.0–0.1)
Basophils Relative: 0 %
Eosinophils Absolute: 0.1 10*3/uL (ref 0.0–0.5)
Eosinophils Relative: 1 %
HCT: 39.9 % (ref 36.0–46.0)
Hemoglobin: 13.2 g/dL (ref 12.0–15.0)
Immature Granulocytes: 0 %
Lymphocytes Relative: 32 %
Lymphs Abs: 2 10*3/uL (ref 0.7–4.0)
MCH: 30.4 pg (ref 26.0–34.0)
MCHC: 33.1 g/dL (ref 30.0–36.0)
MCV: 91.9 fL (ref 80.0–100.0)
Monocytes Absolute: 0.3 10*3/uL (ref 0.1–1.0)
Monocytes Relative: 6 %
Neutro Abs: 3.8 10*3/uL (ref 1.7–7.7)
Neutrophils Relative %: 61 %
Platelets: 272 10*3/uL (ref 150–400)
RBC: 4.34 MIL/uL (ref 3.87–5.11)
RDW: 13 % (ref 11.5–15.5)
WBC: 6.2 10*3/uL (ref 4.0–10.5)
nRBC: 0 % (ref 0.0–0.2)

## 2020-10-04 LAB — SURGICAL PCR SCREEN
MRSA, PCR: NEGATIVE
Staphylococcus aureus: POSITIVE — AB

## 2020-10-04 NOTE — Patient Instructions (Addendum)
Your procedure is scheduled on: Thursday, September 8 Report to the Registration Desk on the 1st floor of the [facility] To find out your arrival time, please call [phone] between 1PM - 3PM on: Wednesday, September 7  REMEMBER: Instructions that are not followed completely may result in serious medical risk, up to and including death; or upon the discretion of your surgeon and anesthesiologist your surgery may need to be rescheduled.  Do not eat food after midnight the night before surgery.  No gum chewing, lozengers or hard candies.  You may however, drink CLEAR liquids up to 2 hours before you are scheduled to arrive for your surgery. Do not drink anything within 2 hours of your scheduled arrival time.  Clear liquids include: - water  - apple juice without pulp - gatorade (not RED, PURPLE, OR BLUE) - black coffee or tea (Do NOT add milk or creamers to the coffee or tea) Do NOT drink anything that is not on this list.  In addition, your doctor has ordered for you to drink the provided  Ensure Pre-Surgery Clear Carbohydrate Drink  Drinking this carbohydrate drink up to two hours before surgery helps to reduce insulin resistance and improve patient outcomes. Please complete drinking 2 hours prior to scheduled arrival time.  TAKE THESE MEDICATIONS THE MORNING OF SURGERY WITH A SIP OF WATER:  Famotidine (Pepcid) - (take one the night before and one on the morning of surgery - helps to prevent nausea after surgery.) Gabapentin Venlafaxine  One week prior to surgery: starting September 1 Stop Anti-inflammatories (NSAIDS) such as Advil, Aleve, Ibuprofen, Motrin, Naproxen, Naprosyn and Aspirin based products such as Excedrin, Goodys Powder, BC Powder. Stop ANY OVER THE COUNTER supplements until after surgery. Stop multiple vitamin You may however, continue to take Tylenol if needed for pain up until the day of surgery.  No Alcohol for 24 hours before or after surgery.  On the  morning of surgery brush your teeth with toothpaste and water, you may rinse your mouth with mouthwash if you wish. Do not swallow any toothpaste or mouthwash.  Do not wear jewelry, make-up, hairpins, clips or nail polish.  Do not wear lotions, powders, or perfumes.   Do not shave body from the neck down 48 hours prior to surgery just in case you cut yourself which could leave a site for infection.  Also, freshly shaved skin may become irritated if using the CHG soap.  Do not bring valuables to the hospital. [facility] is not responsible for any missing/lost belongings or valuables.   Use CHG Soap as directed on instruction sheet.  Notify your doctor if there is any change in your medical condition (cold, fever, infection).  Wear comfortable clothing (specific to your surgery type) to the hospital.  After surgery, you can help prevent lung complications by doing breathing exercises.  Take deep breaths and cough every 1-2 hours. Your doctor may order a device called an Incentive Spirometer to help you take deep breaths.  If you are being admitted to the hospital overnight, leave your suitcase in the car. After surgery it may be brought to your room.  If you are being discharged the day of surgery, you will not be allowed to drive home. You will need a responsible adult (18 years or older) to drive you home and stay with you that night.   If you are taking public transportation, you will need to have a responsible adult (18 years or older) with you. Please confirm  with your physician that it is acceptable to use public transportation.   Please call the Pre-admissions Testing Dept. at [phone] if you have any questions about these instructions.  Surgery Visitation Policy:  Patients undergoing a surgery or procedure may have one family member or support person with them as long as that person is not COVID-19 positive or experiencing its symptoms.  That person may remain in the  waiting area during the procedure.  Inpatient Visitation:    Visiting hours are 7 a.m. to 8 p.m. Inpatients will be allowed two visitors daily. The visitors may change each day during the patient's stay. No visitors under the age of 44. Any visitor under the age of 54 must be accompanied by an adult. The visitor must pass COVID-19 screenings, use hand sanitizer when entering and exiting the patient's room and wear a mask at all times, including in the patient's room. Patients must also wear a mask when staff or their visitor are in the room. Masking is required regardless of vaccination status.

## 2020-10-12 ENCOUNTER — Other Ambulatory Visit
Admission: RE | Admit: 2020-10-12 | Discharge: 2020-10-12 | Disposition: A | Discharge status: Home / Self Care | Payer: Medicare Other | Source: Ambulatory Visit | Attending: Surgery | Admitting: Surgery

## 2020-10-12 ENCOUNTER — Other Ambulatory Visit: Payer: Self-pay

## 2020-10-12 DIAGNOSIS — Z20822 Contact with and (suspected) exposure to covid-19: Secondary | ICD-10-CM | POA: Insufficient documentation

## 2020-10-12 DIAGNOSIS — Z01812 Encounter for preprocedural laboratory examination: Secondary | ICD-10-CM | POA: Diagnosis not present

## 2020-10-12 LAB — SARS CORONAVIRUS 2 (TAT 6-24 HRS): SARS Coronavirus 2: NEGATIVE

## 2020-10-13 ENCOUNTER — Other Ambulatory Visit: Payer: Self-pay

## 2020-10-13 ENCOUNTER — Ambulatory Visit (INDEPENDENT_AMBULATORY_CARE_PROVIDER_SITE_OTHER): Payer: Medicare Other | Admitting: Nurse Practitioner

## 2020-10-13 ENCOUNTER — Encounter: Payer: Self-pay | Admitting: Nurse Practitioner

## 2020-10-13 VITALS — BP 116/81 | HR 69 | Temp 98.5°F | Ht 62.01 in | Wt 221.4 lb

## 2020-10-13 DIAGNOSIS — F419 Anxiety disorder, unspecified: Secondary | ICD-10-CM | POA: Diagnosis not present

## 2020-10-13 DIAGNOSIS — E785 Hyperlipidemia, unspecified: Secondary | ICD-10-CM

## 2020-10-13 DIAGNOSIS — H9313 Tinnitus, bilateral: Secondary | ICD-10-CM | POA: Diagnosis not present

## 2020-10-13 DIAGNOSIS — F32A Depression, unspecified: Secondary | ICD-10-CM

## 2020-10-13 NOTE — Assessment & Plan Note (Signed)
Chronic.  Controlled.  Continue with current medication regimen of Effexor 150mg  daily.  Labs ordered today. Will make recommendations based on lab results. Return to clinic in 6 months for reevaluation.  Call sooner if concerns arise.

## 2020-10-13 NOTE — Assessment & Plan Note (Signed)
Chronic.  Controlled.  Continue with current medication regimen of Crestor 20mg .  Labs ordered today. Will make recommendations based on lab results. Return to clinic in 6 months for reevaluation.  Call sooner if concerns arise.

## 2020-10-13 NOTE — Assessment & Plan Note (Signed)
Chronic.  Controlled.  Continue with current medication regimen of Effexor 150mg daily.  Labs ordered today. Will make recommendations based on lab results. Return to clinic in 6 months for reevaluation.  Call sooner if concerns arise.   

## 2020-10-13 NOTE — Progress Notes (Signed)
BP 116/81   Pulse 69   Temp 98.5 F (36.9 C) (Oral)   Ht 5' 2.01" (1.575 m)   Wt 221 lb 6.4 oz (100.4 kg)   SpO2 95%   BMI 40.48 kg/m    Subjective:    Patient ID: Jill Burch, female    DOB: 1960/04/17, 60 y.o.   MRN: [id number]  HPI: Jill Burch is a 60 y.o. female  Chief Complaint  Patient presents with   Hypertension   Hyperlipidemia   Diabetes   DEPRESSION/ANXIETY Patient states the Effexor is working well for her.  She is stressed about her upcoming operation tomorrow.  Denies SI.   HYPERLIPIDEMIA Hyperlipidemia status: excellent compliance Satisfied with current treatment?  no Side effects:  no Medication compliance: excellent compliance Past cholesterol meds: rosuvastatin (crestor) and lovaza Supplements: none Aspirin:  no The 10-year ASCVD risk score Mikey Bussing DC Jr., et al., 2013) is: 4.1%   Values used to calculate the score:     Age: 46 years     Sex: Female     Is Non-Hispanic African American: No     Diabetic: No     Tobacco smoker: No     Systolic Blood Pressure: 572 mmHg     Is BP treated: No     HDL Cholesterol: 52 mg/dL     Total Cholesterol: 258 mg/dL Chest pain:  no Coronary artery disease:  no Family history CAD:  no Family history early CAD:  no  Patient states she is having a lot of ringing in her ears secondary to a pinched nerve in her neck.  She would like to see a new ENT.  Relevant past medical, surgical, family and social history reviewed and updated as indicated. Interim medical history since our last visit reviewed. Allergies and medications reviewed and updated.  Review of Systems  Eyes:  Negative for visual disturbance.  Respiratory:  Negative for cough, chest tightness and shortness of breath.   Cardiovascular:  Negative for chest pain, palpitations and leg swelling.  Neurological:  Negative for dizziness and headaches.  Psychiatric/Behavioral:  Negative for dysphoric mood and suicidal ideas. The patient is nervous/anxious.     Per HPI unless specifically indicated above     Objective:    BP 116/81   Pulse 69   Temp 98.5 F (36.9 C) (Oral)   Ht 5' 2.01" (1.575 m)   Wt 221 lb 6.4 oz (100.4 kg)   SpO2 95%   BMI 40.48 kg/m   Wt Readings from Last 3 Encounters:  10/13/20 221 lb 6.4 oz (100.4 kg)  10/04/20 223 lb (101.2 kg)  08/25/20 223 lb 2 oz (101.2 kg)    Physical Exam Vitals and nursing note reviewed.  Constitutional:      General: She is not in acute distress.    Appearance: Normal appearance. She is obese. She is not ill-appearing, toxic-appearing or diaphoretic.  HENT:     Head: Normocephalic.     Right Ear: External ear normal.     Left Ear: External ear normal.     Nose: Nose normal.     Mouth/Throat:     Mouth: Mucous membranes are moist.     Pharynx: Oropharynx is clear.  Eyes:     General:        Right eye: No discharge.        Left eye: No discharge.     Extraocular Movements: Extraocular movements intact.     Conjunctiva/sclera: Conjunctivae normal.  Pupils: Pupils are equal, round, and reactive to light.  Cardiovascular:     Rate and Rhythm: Normal rate and regular rhythm.     Heart sounds: No murmur heard. Pulmonary:     Effort: Pulmonary effort is normal. No respiratory distress.     Breath sounds: Normal breath sounds. No wheezing or rales.  Musculoskeletal:     Cervical back: Normal range of motion and neck supple.  Skin:    General: Skin is warm and dry.     Capillary Refill: Capillary refill takes less than 2 seconds.  Neurological:     General: No focal deficit present.     Mental Status: She is alert and oriented to person, place, and time. Mental status is at baseline.  Psychiatric:        Mood and Affect: Mood normal.        Behavior: Behavior normal.        Thought Content: Thought content normal.        Judgment: Judgment normal.    Results for orders placed or performed during the hospital encounter of 10/12/20  SARS CORONAVIRUS 2 (TAT 6-24 HRS)  Nasopharyngeal Nasopharyngeal Swab   Specimen: Nasopharyngeal Swab  Result Value Ref Range   SARS Coronavirus 2 NEGATIVE NEGATIVE      Assessment & Plan:   Problem List Items Addressed This Visit       Other   Anxiety - Primary    Chronic.  Controlled.  Continue with current medication regimen of Effexor 146m daily.  Labs ordered today. Will make recommendations based on lab results. Return to clinic in 6 months for reevaluation.  Call sooner if concerns arise.        Relevant Orders   Comp Met (CMET)   AMB Referral to [facility]   Depression    Chronic.  Controlled.  Continue with current medication regimen of Effexor 1556mdaily.  Labs ordered today. Will make recommendations based on lab results. Return to clinic in 6 months for reevaluation.  Call sooner if concerns arise.       Relevant Orders   Comp Met (CMET)   AMB Referral to [facility]   Hyperlipidemia    Chronic.  Controlled.  Continue with current medication regimen of Crestor 2010m Labs ordered today. Will make recommendations based on lab results. Return to clinic in 6 months for reevaluation.  Call sooner if concerns arise.        Relevant Orders   Lipid Profile   [facility]   Other Visit Diagnoses     Tinnitus of both ears       Referral placed for patient to see ENT due to the Tinnitus she is experiencing.   Relevant Orders   Ambulatory referral to ENT        Follow up plan: Return in about 4 months (around 02/12/2021) for HTN, HLD, DM2 FU.

## 2020-10-14 ENCOUNTER — Inpatient Hospital Stay
Admission: RE | Admit: 2020-10-14 | Discharge: 2020-10-18 | DRG: 470 | Disposition: A | Discharge status: Home Health | Payer: Medicare Other | Attending: Surgery | Admitting: Surgery

## 2020-10-14 ENCOUNTER — Encounter: Payer: Self-pay | Admitting: Surgery

## 2020-10-14 ENCOUNTER — Inpatient Hospital Stay: Payer: Medicare Other

## 2020-10-14 ENCOUNTER — Ambulatory Visit: Payer: Medicare Other | Admitting: Urgent Care

## 2020-10-14 ENCOUNTER — Ambulatory Visit: Payer: Medicare Other

## 2020-10-14 ENCOUNTER — Other Ambulatory Visit: Payer: Self-pay

## 2020-10-14 ENCOUNTER — Encounter
Admission: RE | Disposition: A | Discharge status: Home Health | Payer: Self-pay | Source: Home / Self Care | Attending: Surgery

## 2020-10-14 DIAGNOSIS — F418 Other specified anxiety disorders: Secondary | ICD-10-CM | POA: Diagnosis present

## 2020-10-14 DIAGNOSIS — Z9889 Other specified postprocedural states: Secondary | ICD-10-CM | POA: Diagnosis not present

## 2020-10-14 DIAGNOSIS — M1712 Unilateral primary osteoarthritis, left knee: Principal | ICD-10-CM | POA: Diagnosis present

## 2020-10-14 DIAGNOSIS — E785 Hyperlipidemia, unspecified: Secondary | ICD-10-CM | POA: Diagnosis not present

## 2020-10-14 DIAGNOSIS — Z87891 Personal history of nicotine dependence: Secondary | ICD-10-CM | POA: Diagnosis not present

## 2020-10-14 DIAGNOSIS — Z96652 Presence of left artificial knee joint: Secondary | ICD-10-CM

## 2020-10-14 DIAGNOSIS — R600 Localized edema: Secondary | ICD-10-CM | POA: Diagnosis not present

## 2020-10-14 DIAGNOSIS — I951 Orthostatic hypotension: Secondary | ICD-10-CM | POA: Diagnosis not present

## 2020-10-14 DIAGNOSIS — Z23 Encounter for immunization: Secondary | ICD-10-CM

## 2020-10-14 DIAGNOSIS — Z8261 Family history of arthritis: Secondary | ICD-10-CM | POA: Diagnosis not present

## 2020-10-14 DIAGNOSIS — Z833 Family history of diabetes mellitus: Secondary | ICD-10-CM

## 2020-10-14 DIAGNOSIS — Z01818 Encounter for other preprocedural examination: Secondary | ICD-10-CM | POA: Diagnosis not present

## 2020-10-14 DIAGNOSIS — Z471 Aftercare following joint replacement surgery: Secondary | ICD-10-CM | POA: Diagnosis not present

## 2020-10-14 DIAGNOSIS — Z6841 Body Mass Index (BMI) 40.0 and over, adult: Secondary | ICD-10-CM

## 2020-10-14 DIAGNOSIS — Z8042 Family history of malignant neoplasm of prostate: Secondary | ICD-10-CM

## 2020-10-14 HISTORY — PX: TOTAL KNEE ARTHROPLASTY: SHX125

## 2020-10-14 HISTORY — PX: APPLICATION OF WOUND VAC: SHX5189

## 2020-10-14 LAB — LIPID PANEL
Chol/HDL Ratio: 3.4 ratio (ref 0.0–4.4)
Cholesterol, Total: 164 mg/dL (ref 100–199)
HDL: 48 mg/dL (ref >39)
LDL Chol Calc (NIH): 95 mg/dL (ref 0–99)
Triglycerides: 114 mg/dL (ref 0–149)
VLDL Cholesterol Cal: 21 mg/dL (ref 5–40)

## 2020-10-14 LAB — COMPREHENSIVE METABOLIC PANEL
ALT: 17 IU/L (ref 0–32)
AST: 19 IU/L (ref 0–40)
Albumin/Globulin Ratio: 1.8 (ref 1.2–2.2)
Albumin: 4.5 g/dL (ref 3.8–4.8)
Alkaline Phosphatase: 79 IU/L (ref 44–121)
BUN/Creatinine Ratio: 19 (ref 12–28)
BUN: 16 mg/dL (ref 8–27)
Bilirubin Total: 0.4 mg/dL (ref 0.0–1.2)
CO2: 26 mmol/L (ref 20–29)
Calcium: 9.5 mg/dL (ref 8.7–10.3)
Chloride: 103 mmol/L (ref 96–106)
Creatinine, Ser: 0.83 mg/dL (ref 0.57–1.00)
Globulin, Total: 2.5 g/dL (ref 1.5–4.5)
Glucose: 79 mg/dL (ref 65–99)
Potassium: 4.3 mmol/L (ref 3.5–5.2)
Sodium: 143 mmol/L (ref 134–144)
Total Protein: 7 g/dL (ref 6.0–8.5)
eGFR: 80 mL/min/{1.73_m2} (ref >59)

## 2020-10-14 LAB — ABO/RH: ABO/RH(D): B POS

## 2020-10-14 SURGERY — ARTHROPLASTY, KNEE, TOTAL
Anesthesia: Monitor Anesthesia Care | Site: Knee | Laterality: Left

## 2020-10-14 MED ORDER — DOCUSATE SODIUM 100 MG PO CAPS
100.0000 mg | ORAL_CAPSULE | Freq: Two times a day (BID) | ORAL | Status: DC
  Administered 2020-10-14 – 2020-10-18 (×8): 100 mg via ORAL
  Filled 2020-10-14 (×8): qty 1

## 2020-10-14 MED ORDER — KETOROLAC TROMETHAMINE 15 MG/ML IJ SOLN: 15.0000 mg | Freq: Once | INTRAMUSCULAR | Status: AC

## 2020-10-14 MED ORDER — MIDAZOLAM HCL 2 MG/2ML IJ SOLN
INTRAMUSCULAR | Status: DC | PRN
  Administered 2020-10-14: 2 mg via INTRAVENOUS

## 2020-10-14 MED ORDER — FENTANYL CITRATE (PF) 100 MCG/2ML IJ SOLN
INTRAMUSCULAR | Status: AC
  Filled 2020-10-14: qty 2

## 2020-10-14 MED ORDER — MEPERIDINE HCL 25 MG/ML IJ SOLN: 6.2500 mg | INTRAMUSCULAR | Status: DC | PRN

## 2020-10-14 MED ORDER — GLYCOPYRROLATE 0.2 MG/ML IJ SOLN
INTRAMUSCULAR | Status: DC | PRN
  Administered 2020-10-14 (×2): .1 mg via INTRAVENOUS

## 2020-10-14 MED ORDER — CEFAZOLIN SODIUM-DEXTROSE 2-4 GM/100ML-% IV SOLN
INTRAVENOUS | Status: AC
  Filled 2020-10-14: qty 100

## 2020-10-14 MED ORDER — BUPIVACAINE-EPINEPHRINE (PF) 0.5% -1:200000 IJ SOLN
INTRAMUSCULAR | Status: AC
  Filled 2020-10-14: qty 30

## 2020-10-14 MED ORDER — ROSUVASTATIN CALCIUM 10 MG PO TABS
20.0000 mg | ORAL_TABLET | Freq: Every day | ORAL | Status: DC
  Administered 2020-10-14 – 2020-10-17 (×4): 20 mg via ORAL
  Filled 2020-10-14 (×4): qty 2

## 2020-10-14 MED ORDER — TRANEXAMIC ACID 1000 MG/10ML IV SOLN
INTRAVENOUS | Status: AC
  Filled 2020-10-14: qty 10

## 2020-10-14 MED ORDER — ORAL CARE MOUTH RINSE: 15.0000 mL | Freq: Once | OROMUCOSAL | Status: AC

## 2020-10-14 MED ORDER — PHENYLEPHRINE HCL (PRESSORS) 10 MG/ML IV SOLN
INTRAVENOUS | Status: DC | PRN
  Administered 2020-10-14: 100 ug via INTRAVENOUS

## 2020-10-14 MED ORDER — APIXABAN 2.5 MG PO TABS
2.5000 mg | ORAL_TABLET | Freq: Two times a day (BID) | ORAL | Status: DC
  Administered 2020-10-15 – 2020-10-18 (×7): 2.5 mg via ORAL
  Filled 2020-10-14 (×7): qty 1

## 2020-10-14 MED ORDER — MIDAZOLAM HCL 2 MG/2ML IJ SOLN
INTRAMUSCULAR | Status: AC
  Filled 2020-10-14: qty 2

## 2020-10-14 MED ORDER — ADULT MULTIVITAMIN W/MINERALS CH
1.0000 | ORAL_TABLET | Freq: Every day | ORAL | Status: DC
Start: 2020-10-15 — End: 2020-10-18
  Administered 2020-10-15 – 2020-10-18 (×4): 1 via ORAL
  Filled 2020-10-14 (×4): qty 1

## 2020-10-14 MED ORDER — SUGAMMADEX SODIUM 200 MG/2ML IV SOLN
INTRAVENOUS | Status: DC | PRN
  Administered 2020-10-14: 200 mg via INTRAVENOUS

## 2020-10-14 MED ORDER — ACETAMINOPHEN 325 MG PO TABS
325.0000 mg | ORAL_TABLET | Freq: Four times a day (QID) | ORAL | Status: DC | PRN
  Administered 2020-10-16 – 2020-10-17 (×3): 650 mg via ORAL
  Filled 2020-10-14 (×3): qty 2

## 2020-10-14 MED ORDER — METOCLOPRAMIDE HCL 5 MG/ML IJ SOLN: 5.0000 mg | Freq: Three times a day (TID) | INTRAMUSCULAR | Status: DC | PRN

## 2020-10-14 MED ORDER — FAMOTIDINE 20 MG PO TABS: 20.0000 mg | ORAL_TABLET | Freq: Two times a day (BID) | ORAL | Status: DC | PRN

## 2020-10-14 MED ORDER — TRAMADOL HCL 50 MG PO TABS
50.0000 mg | ORAL_TABLET | Freq: Four times a day (QID) | ORAL | Status: DC | PRN
  Administered 2020-10-15 – 2020-10-17 (×5): 50 mg via ORAL
  Filled 2020-10-14 (×5): qty 1

## 2020-10-14 MED ORDER — GABAPENTIN 300 MG PO CAPS
300.0000 mg | ORAL_CAPSULE | Freq: Two times a day (BID) | ORAL | Status: DC
  Administered 2020-10-14 – 2020-10-18 (×8): 300 mg via ORAL
  Filled 2020-10-14 (×8): qty 1

## 2020-10-14 MED ORDER — BUPIVACAINE HCL (PF) 0.5 % IJ SOLN
INTRAMUSCULAR | Status: AC
  Filled 2020-10-14: qty 10

## 2020-10-14 MED ORDER — APREPITANT 40 MG PO CAPS
ORAL_CAPSULE | ORAL | Status: AC
  Filled 2020-10-14: qty 1

## 2020-10-14 MED ORDER — LACTATED RINGERS IV SOLN: INTRAVENOUS | Status: DC

## 2020-10-14 MED ORDER — FLEET ENEMA 7-19 GM/118ML RE ENEM: 1.0000 | ENEMA | Freq: Once | RECTAL | Status: DC | PRN

## 2020-10-14 MED ORDER — OXYCODONE HCL 5 MG PO TABS
ORAL_TABLET | ORAL | Status: AC
  Filled 2020-10-14: qty 1

## 2020-10-14 MED ORDER — CHLORHEXIDINE GLUCONATE 0.12 % MT SOLN: 15.0000 mL | Freq: Once | OROMUCOSAL | Status: AC

## 2020-10-14 MED ORDER — ROCURONIUM BROMIDE 100 MG/10ML IV SOLN
INTRAVENOUS | Status: DC | PRN
  Administered 2020-10-14: 30 mg via INTRAVENOUS
  Administered 2020-10-14: 50 mg via INTRAVENOUS
  Administered 2020-10-14: 10 mg via INTRAVENOUS

## 2020-10-14 MED ORDER — CEFAZOLIN SODIUM-DEXTROSE 2-4 GM/100ML-% IV SOLN
2.0000 g | Freq: Four times a day (QID) | INTRAVENOUS | Status: AC
  Administered 2020-10-14 – 2020-10-15 (×2): 2 g via INTRAVENOUS
  Filled 2020-10-14 (×2): qty 100

## 2020-10-14 MED ORDER — VENLAFAXINE HCL ER 150 MG PO CP24
150.0000 mg | ORAL_CAPSULE | Freq: Every day | ORAL | Status: DC
  Administered 2020-10-15 – 2020-10-18 (×4): 150 mg via ORAL
  Filled 2020-10-14 (×5): qty 1

## 2020-10-14 MED ORDER — SODIUM CHLORIDE FLUSH 0.9 % IV SOLN
INTRAVENOUS | Status: AC
  Filled 2020-10-14: qty 40

## 2020-10-14 MED ORDER — DEXAMETHASONE SODIUM PHOSPHATE 10 MG/ML IJ SOLN
INTRAMUSCULAR | Status: DC | PRN
  Administered 2020-10-14: 10 mg via INTRAVENOUS

## 2020-10-14 MED ORDER — LIDOCAINE HCL (CARDIAC) PF 100 MG/5ML IV SOSY
PREFILLED_SYRINGE | INTRAVENOUS | Status: DC | PRN
  Administered 2020-10-14: 60 mg via INTRAVENOUS

## 2020-10-14 MED ORDER — TRANEXAMIC ACID 1000 MG/10ML IV SOLN
INTRAVENOUS | Status: DC | PRN
  Administered 2020-10-14: 1000 mg via TOPICAL

## 2020-10-14 MED ORDER — PROPOFOL 10 MG/ML IV BOLUS
INTRAVENOUS | Status: DC | PRN
  Administered 2020-10-14: 200 mg via INTRAVENOUS

## 2020-10-14 MED ORDER — ACETAMINOPHEN 500 MG PO TABS
1000.0000 mg | ORAL_TABLET | Freq: Four times a day (QID) | ORAL | Status: AC
  Administered 2020-10-14 – 2020-10-15 (×4): 1000 mg via ORAL
  Filled 2020-10-14 (×4): qty 2

## 2020-10-14 MED ORDER — DEXMEDETOMIDINE (PRECEDEX) IN NS 20 MCG/5ML (4 MCG/ML) IV SYRINGE
PREFILLED_SYRINGE | INTRAVENOUS | Status: AC
  Filled 2020-10-14: qty 5

## 2020-10-14 MED ORDER — LIDOCAINE HCL URETHRAL/MUCOSAL 2 % EX GEL
CUTANEOUS | Status: AC
  Filled 2020-10-14: qty 5

## 2020-10-14 MED ORDER — ONDANSETRON HCL 4 MG PO TABS: 4.0000 mg | ORAL_TABLET | Freq: Four times a day (QID) | ORAL | Status: DC | PRN

## 2020-10-14 MED ORDER — APREPITANT 40 MG PO CAPS
40.0000 mg | ORAL_CAPSULE | Freq: Once | ORAL | Status: AC
  Administered 2020-10-14: 40 mg via ORAL

## 2020-10-14 MED ORDER — MAGNESIUM HYDROXIDE 400 MG/5ML PO SUSP
30.0000 mL | Freq: Every day | ORAL | Status: DC | PRN
  Administered 2020-10-15 – 2020-10-17 (×2): 30 mL via ORAL
  Filled 2020-10-14 (×2): qty 30

## 2020-10-14 MED ORDER — ONDANSETRON HCL 4 MG/2ML IJ SOLN
INTRAMUSCULAR | Status: AC
  Filled 2020-10-14: qty 2

## 2020-10-14 MED ORDER — FENTANYL CITRATE (PF) 100 MCG/2ML IJ SOLN
INTRAMUSCULAR | Status: AC
  Administered 2020-10-14: 25 ug via INTRAVENOUS
  Filled 2020-10-14: qty 2

## 2020-10-14 MED ORDER — FENTANYL CITRATE (PF) 100 MCG/2ML IJ SOLN
25.0000 ug | INTRAMUSCULAR | Status: DC | PRN
  Administered 2020-10-14 (×3): 25 ug via INTRAVENOUS

## 2020-10-14 MED ORDER — 0.9 % SODIUM CHLORIDE (POUR BTL) OPTIME
TOPICAL | Status: DC | PRN
  Administered 2020-10-14: 200 mL

## 2020-10-14 MED ORDER — SODIUM CHLORIDE 0.9 % IR SOLN
Status: DC | PRN
  Administered 2020-10-14: 3000 mL

## 2020-10-14 MED ORDER — ONDANSETRON HCL 4 MG/2ML IJ SOLN: 4.0000 mg | Freq: Once | INTRAMUSCULAR | Status: DC | PRN

## 2020-10-14 MED ORDER — ACETAMINOPHEN 10 MG/ML IV SOLN
INTRAVENOUS | Status: AC
  Filled 2020-10-14: qty 100

## 2020-10-14 MED ORDER — BUPIVACAINE LIPOSOME 1.3 % IJ SUSP
INTRAMUSCULAR | Status: AC
  Filled 2020-10-14: qty 20

## 2020-10-14 MED ORDER — BUPIVACAINE-EPINEPHRINE (PF) 0.5% -1:200000 IJ SOLN
INTRAMUSCULAR | Status: DC | PRN
  Administered 2020-10-14: 30 mL

## 2020-10-14 MED ORDER — ONDANSETRON HCL 4 MG/2ML IJ SOLN: 4.0000 mg | Freq: Four times a day (QID) | INTRAMUSCULAR | Status: DC | PRN

## 2020-10-14 MED ORDER — SODIUM CHLORIDE 0.9 % IV SOLN
INTRAVENOUS | Status: DC | PRN
  Administered 2020-10-14: 50 ug/min via INTRAVENOUS

## 2020-10-14 MED ORDER — FENTANYL CITRATE (PF) 100 MCG/2ML IJ SOLN
INTRAMUSCULAR | Status: DC | PRN
  Administered 2020-10-14: 12.5 ug via INTRAVENOUS
  Administered 2020-10-14: 50 ug via INTRAVENOUS
  Administered 2020-10-14: 12.5 ug via INTRAVENOUS

## 2020-10-14 MED ORDER — METOCLOPRAMIDE HCL 10 MG PO TABS: 5.0000 mg | ORAL_TABLET | Freq: Three times a day (TID) | ORAL | Status: DC | PRN

## 2020-10-14 MED ORDER — DEXMEDETOMIDINE (PRECEDEX) IN NS 20 MCG/5ML (4 MCG/ML) IV SYRINGE
PREFILLED_SYRINGE | INTRAVENOUS | Status: DC | PRN
  Administered 2020-10-14 (×2): 4 ug via INTRAVENOUS

## 2020-10-14 MED ORDER — TRAMADOL HCL 50 MG PO TABS
ORAL_TABLET | ORAL | Status: AC
  Administered 2020-10-14: 50 mg via ORAL
  Filled 2020-10-14: qty 1

## 2020-10-14 MED ORDER — KETOROLAC TROMETHAMINE 15 MG/ML IJ SOLN
INTRAMUSCULAR | Status: AC
  Administered 2020-10-14: 15 mg via INTRAVENOUS
  Filled 2020-10-14: qty 1

## 2020-10-14 MED ORDER — PROPOFOL 1000 MG/100ML IV EMUL
INTRAVENOUS | Status: AC
  Filled 2020-10-14: qty 100

## 2020-10-14 MED ORDER — KETOROLAC TROMETHAMINE 15 MG/ML IJ SOLN
7.5000 mg | Freq: Four times a day (QID) | INTRAMUSCULAR | Status: AC
  Administered 2020-10-14 – 2020-10-15 (×4): 7.5 mg via INTRAVENOUS
  Filled 2020-10-14 (×4): qty 1

## 2020-10-14 MED ORDER — OXYCODONE HCL 5 MG PO TABS
5.0000 mg | ORAL_TABLET | ORAL | Status: DC | PRN
  Administered 2020-10-14: 5 mg via ORAL
  Administered 2020-10-15: 10 mg via ORAL
  Administered 2020-10-15 – 2020-10-16 (×2): 5 mg via ORAL
  Administered 2020-10-16 (×3): 10 mg via ORAL
  Administered 2020-10-17: 5 mg via ORAL
  Administered 2020-10-17: 10 mg via ORAL
  Administered 2020-10-17 – 2020-10-18 (×2): 5 mg via ORAL
  Filled 2020-10-14 (×5): qty 2
  Filled 2020-10-14 (×4): qty 1
  Filled 2020-10-14 (×2): qty 2

## 2020-10-14 MED ORDER — CEFAZOLIN SODIUM-DEXTROSE 2-4 GM/100ML-% IV SOLN
INTRAVENOUS | Status: AC
  Administered 2020-10-14: 2 g via INTRAVENOUS
  Filled 2020-10-14: qty 100

## 2020-10-14 MED ORDER — DIPHENHYDRAMINE HCL 12.5 MG/5ML PO ELIX: 12.5000 mg | ORAL_SOLUTION | ORAL | Status: DC | PRN

## 2020-10-14 MED ORDER — BISACODYL 10 MG RE SUPP: 10.0000 mg | Freq: Every day | RECTAL | Status: DC | PRN

## 2020-10-14 MED ORDER — INFLUENZA VAC SPLIT QUAD 0.5 ML IM SUSY
0.5000 mL | PREFILLED_SYRINGE | INTRAMUSCULAR | Status: AC
  Administered 2020-10-15: 0.5 mL via INTRAMUSCULAR
  Filled 2020-10-14: qty 0.5

## 2020-10-14 MED ORDER — CHLORHEXIDINE GLUCONATE 0.12 % MT SOLN
OROMUCOSAL | Status: AC
  Administered 2020-10-14: 15 mL via OROMUCOSAL
  Filled 2020-10-14: qty 15

## 2020-10-14 MED ORDER — ACETAMINOPHEN 10 MG/ML IV SOLN
INTRAVENOUS | Status: DC | PRN
  Administered 2020-10-14: 1000 mg via INTRAVENOUS

## 2020-10-14 MED ORDER — SODIUM CHLORIDE 0.9 % IV SOLN
INTRAVENOUS | Status: DC | PRN
  Administered 2020-10-14: 60 mL

## 2020-10-14 MED ORDER — SODIUM CHLORIDE 0.9 % IV SOLN: INTRAVENOUS | Status: DC

## 2020-10-14 MED ORDER — CEFAZOLIN SODIUM-DEXTROSE 2-4 GM/100ML-% IV SOLN
2.0000 g | INTRAVENOUS | Status: AC
  Administered 2020-10-14: 2 g via INTRAVENOUS

## 2020-10-14 MED ORDER — PHENYLEPHRINE HCL (PRESSORS) 10 MG/ML IV SOLN
INTRAVENOUS | Status: AC
  Filled 2020-10-14: qty 1

## 2020-10-14 MED ORDER — OXYCODONE HCL 5 MG PO TABS
ORAL_TABLET | ORAL | Status: AC
  Administered 2020-10-14: 5 mg via ORAL
  Filled 2020-10-14: qty 1

## 2020-10-14 MED ORDER — HYDROMORPHONE HCL 1 MG/ML IJ SOLN
0.2500 mg | INTRAMUSCULAR | Status: DC | PRN
  Administered 2020-10-15: 0.5 mg via INTRAVENOUS
  Filled 2020-10-14: qty 1

## 2020-10-14 MED ORDER — KETAMINE HCL 50 MG/5ML IJ SOSY
PREFILLED_SYRINGE | INTRAMUSCULAR | Status: AC
  Filled 2020-10-14: qty 5

## 2020-10-14 MED ORDER — SODIUM CHLORIDE 0.9 % IV BOLUS
500.0000 mL | Freq: Once | INTRAVENOUS | Status: AC
  Administered 2020-10-14: 500 mL via INTRAVENOUS

## 2020-10-14 MED ORDER — KETAMINE HCL 10 MG/ML IJ SOLN
INTRAMUSCULAR | Status: DC | PRN
  Administered 2020-10-14 (×2): 25 mg via INTRAVENOUS

## 2020-10-14 MED ORDER — ONDANSETRON HCL 4 MG/2ML IJ SOLN
INTRAMUSCULAR | Status: DC | PRN
  Administered 2020-10-14: 4 mg via INTRAVENOUS

## 2020-10-14 SURGICAL SUPPLY — 63 items
BLADE SAW SAG 25X90X1.19 (BLADE) ×3 IMPLANT
BLADE SURG SZ20 CARB STEEL (BLADE) ×3 IMPLANT
BNDG ELASTIC 6X5.8 VLCR NS LF (GAUZE/BANDAGES/DRESSINGS) ×3 IMPLANT
CANISTER WOUND CARE 500ML ATS (WOUND CARE) ×3 IMPLANT
CEMENT BONE R 1X40 (Cement) ×6 IMPLANT
CEMENT VACUUM MIXING SYSTEM (MISCELLANEOUS) ×3 IMPLANT
CHLORAPREP W/TINT 26 (MISCELLANEOUS) ×3 IMPLANT
COOLER POLAR GLACIER W/PUMP (MISCELLANEOUS) ×3 IMPLANT
COVER MAYO STAND REUSABLE (DRAPES) ×3 IMPLANT
CUFF TOURN SGL QUICK 24 (TOURNIQUET CUFF)
CUFF TOURN SGL QUICK 34 (TOURNIQUET CUFF)
CUFF TRNQT CYL 24X4X16.5-23 (TOURNIQUET CUFF) IMPLANT
CUFF TRNQT CYL 34X4.125X (TOURNIQUET CUFF) IMPLANT
DRAPE 3/4 80X56 (DRAPES) ×3 IMPLANT
DRAPE IMP U-DRAPE 54X76 (DRAPES) ×3 IMPLANT
DRSG MEPILEX SACRM 8.7X9.8 (GAUZE/BANDAGES/DRESSINGS) ×3 IMPLANT
DRSG OPSITE POSTOP 4X10 (GAUZE/BANDAGES/DRESSINGS) IMPLANT
DRSG OPSITE POSTOP 4X8 (GAUZE/BANDAGES/DRESSINGS) IMPLANT
ELECT REM PT RETURN 9FT ADLT (ELECTROSURGICAL) ×3
ELECTRODE REM PT RTRN 9FT ADLT (ELECTROSURGICAL) ×2 IMPLANT
FEMORAL CR LEFT  70MM (Joint) ×1 IMPLANT
FEMORAL CR LEFT 70MM (Joint) ×2 IMPLANT
GAUZE 4X4 16PLY ~~LOC~~+RFID DBL (SPONGE) ×3 IMPLANT
GAUZE XEROFORM 1X8 LF (GAUZE/BANDAGES/DRESSINGS) ×3 IMPLANT
GLOVE SRG 8 PF TXTR STRL LF DI (GLOVE) ×2 IMPLANT
GLOVE SURG ENC MOIS LTX SZ7.5 (GLOVE) ×12 IMPLANT
GLOVE SURG ENC MOIS LTX SZ8 (GLOVE) ×12 IMPLANT
GLOVE SURG UNDER LTX SZ8 (GLOVE) ×3 IMPLANT
GLOVE SURG UNDER POLY LF SZ8 (GLOVE) ×1
GOWN STRL REUS W/ TWL LRG LVL3 (GOWN DISPOSABLE) ×2 IMPLANT
GOWN STRL REUS W/ TWL XL LVL3 (GOWN DISPOSABLE) ×4 IMPLANT
GOWN STRL REUS W/TWL LRG LVL3 (GOWN DISPOSABLE) ×1
GOWN STRL REUS W/TWL XL LVL3 (GOWN DISPOSABLE) ×2
HOOD PEEL AWAY FLYTE STAYCOOL (MISCELLANEOUS) ×12 IMPLANT
IMPL PATELLA POST 37X8.5 (Miscellaneous) ×2 IMPLANT
IMPLANT PATELLA POST 37X8.5 (Miscellaneous) ×3 IMPLANT
INSERT TIB BEARING 75X12 (Insert) ×3 IMPLANT
IV NS IRRIG 3000ML ARTHROMATIC (IV SOLUTION) ×3 IMPLANT
KIT PREVENA INCISION MGT20CM45 (CANNISTER) ×3 IMPLANT
KIT TURNOVER KIT A (KITS) ×3 IMPLANT
MANIFOLD NEPTUNE II (INSTRUMENTS) ×3 IMPLANT
NEEDLE SPNL 20GX3.5 QUINCKE YW (NEEDLE) ×3 IMPLANT
NS IRRIG 1000ML POUR BTL (IV SOLUTION) ×3 IMPLANT
PACK TOTAL KNEE (MISCELLANEOUS) ×3 IMPLANT
PAD WRAPON POLAR KNEE (MISCELLANEOUS) ×2 IMPLANT
PENCIL SMOKE EVACUATOR (MISCELLANEOUS) ×3 IMPLANT
PLATE KNEE TIBIAL 75MM FIXED (Plate) ×3 IMPLANT
PULSAVAC PLUS IRRIG FAN TIP (DISPOSABLE) ×3
SET GUIDE SURG MEDIAL KNEE (INSTRUMENTS) ×3 IMPLANT
SPONGE T-LAP 18X18 ~~LOC~~+RFID (SPONGE) ×9 IMPLANT
STAPLER SKIN PROX 35W (STAPLE) ×3 IMPLANT
SUCTION FRAZIER HANDLE 10FR (MISCELLANEOUS) ×1
SUCTION TUBE FRAZIER 10FR DISP (MISCELLANEOUS) ×2 IMPLANT
SUT VIC AB 0 CT1 36 (SUTURE) ×9 IMPLANT
SUT VIC AB 2-0 CT1 27 (SUTURE) ×3
SUT VIC AB 2-0 CT1 TAPERPNT 27 (SUTURE) ×6 IMPLANT
SYR 10ML LL (SYRINGE) ×3 IMPLANT
SYR 20ML LL LF (SYRINGE) ×3 IMPLANT
SYR 30ML LL (SYRINGE) IMPLANT
TIP FAN IRRIG PULSAVAC PLUS (DISPOSABLE) ×2 IMPLANT
TRAP FLUID SMOKE EVACUATOR (MISCELLANEOUS) ×3 IMPLANT
WATER STERILE IRR 500ML POUR (IV SOLUTION) ×3 IMPLANT
WRAPON POLAR PAD KNEE (MISCELLANEOUS) ×3

## 2020-10-14 NOTE — Op Note (Signed)
10/14/2020  10:21 AM  Patient:   Jill Burch  Pre-Op Diagnosis:   Degenerative joint disease, left knee.  Post-Op Diagnosis:   Same  Procedure:   Left TKA using all-cemented Signature-guided Biomet Vanguard system with a 70 mm PCR femur, a 75 mm tibial tray with a 12 mm anterior stabilized E-poly insert, and a 37 x 8.6 mm all-poly 3-pegged domed patella.  Surgeon:   Maryagnes Amos, MD  Assistant:   Horris Latino, PA-C; Frederic Jericho, PA-S  Anesthesia:   GET  Findings:   As above  Complications:   None  EBL:   10 cc  Fluids:   700 cc crystalloid  UOP:   None  TT:   100 minutes at 300 mmHg  Drains:   Prevena x1  Closure:   Staples  Implants:   As above  Brief Clinical Note:   The patient is a 60 year old female with a long history of progressively worsening left knee pain. The patient's symptoms have progressed despite medications, activity modification, injections, etc. The patient's history and examination were consistent with advanced degenerative joint disease of the left knee confirmed by plain radiographs. The patient presents at this time for a left total knee arthroplasty.  Procedure:   The patient was brought into the operating room and lain in the supine position. After adequate general endotracheal intubation and anesthesia were obtained, the patient's left lower extremity was prepped with ChloraPrep solution and draped sterilely. Preoperative antibiotics were administered. A timeout was performed to verify the appropriate surgical site before the limb was exsanguinated with an Esmarch and the tourniquet inflated to 300 mmHg.   A standard anterior approach to the knee was made through an approximately 7 inch incision. The incision was carried down through the subcutaneous tissues to expose superficial retinaculum. This was split the length of the incision and the medial flap elevated sufficiently to expose the medial retinaculum. The medial retinaculum was incised,  leaving a 3-4 mm cuff of tissue on the patella. This was extended distally along the medial border of the patellar tendon and proximally through the medial third of the quadriceps tendon. A subtotal fat pad excision was performed before the soft tissues were elevated off the anteromedial and anterolateral aspects of the proximal tibia to the level of the collateral ligaments. The anterior portions of the medial and lateral menisci were removed, as was the anterior cruciate ligament. With the knee flexed to 90, the Signature femoral guide was positioned and the appropriate anterior and distal pins placed.  The distal pins were removed and the distal cutting block was placed over the anterior pins.  The appropriate cut was made through the distal slot. The 70 mm 4-in-1 cutting block was positioned through the holes left by the distal pins.  After verifying its position with the angel wing to be sure that the anterior femoral cortex would not be notched, the appropriate posterior, posterior chamfer, anterior, and anterior chamfer cuts were made.  Attention was directed to the proximal tibia. The signature tibial guide was positioned and the anterior tibial pins placed. The tibial cutting block was positioned and the appropriate proximal tibial cut made. The surface was sized and found to be optimally replicated by the 75 mm tibial tray. At this point, the posterior portions medial and lateral menisci were removed.   A trial reduction was performed using the appropriate femoral and tibial components with first the 10 mm and then the 12 mm insert. The 12 mm insert  demonstrated excellent stability to varus and valgus stressing both in flexion and extension while permitting full extension. Patella tracking was assessed and found to be excellent. Therefore, the tibial guide position was marked on the proximal tibia. The patella thickness was measured and found to be 20 mm. Therefore, the appropriate cut was made. The  patellar surface was measured and found to be optimally replicated by the 37 mm component. The three peg holes were drilled in place before the trial button was inserted. Patella tracking was assessed and found to be excellent, passing the "no thumb test". The lug holes were drilled into the distal femur before the trial component was removed, leaving only the tibial tray. The keel was then created using the appropriate tower, reamer, and punch.  The bony surfaces were prepared for cementing by irrigating them thoroughly with sterile saline solution via the jet lavage system. A bone plug was fashioned from some of the bone that had been removed previously and used to plug the distal femoral canal. In addition, 20 cc of Exparel diluted out to 60 cc with normal saline and 30 cc of 0.5% Sensorcaine were injected into the postero-medial and postero-lateral aspects of the knee, the medial and lateral gutter regions, and the peri-incisional tissues to help with postoperative analgesia. Meanwhile, the cement was being mixed on the back table. When it was ready, the tibial tray was cemented in first. The excess cement was removed using Personal assistant. Next, the femoral component was impacted into place. Again, the excess cement was removed using Personal assistant. The 12 mm trial insert was positioned and the knee brought into extension while the cement hardened. Finally, the patella was cemented into place and secured using the patellar clamp. Again, the excess cement was removed using Personal assistant. Once the cement had hardened, the knee was placed through a range of motion with the findings as described above. Therefore, the trial insert was removed and, after verifying that no cement had been retained posteriorly, the permanent 12 mm anterior stabilized E-polyethylene insert was positioned and secured using the appropriate key locking mechanism. Again the knee was placed through a range of motion with the findings as  described above.  The wound was copiously irrigated with sterile saline solution using the jet lavage system before the quadriceps tendon and retinacular layer were reapproximated using #0 Vicryl interrupted sutures. The superficial retinacular layer also was closed using a running #0 Vicryl suture. A total of 10 cc of transexemic acid (TXA) was injected intra-articularly before the subcutaneous tissues were closed in several layers using 2-0 Vicryl interrupted sutures. The skin was closed using staples. A sterile Prevena dressing was applied to the skin before the leg was wrapped with an Ace wrap to accommodate the Polar Care device. The patient was then awakened, extubated, and returned to the recovery room in satisfactory condition after tolerating the procedure well.

## 2020-10-14 NOTE — Anesthesia Preprocedure Evaluation (Addendum)
Anesthesia Evaluation  Patient identified by MRN, date of birth, ID band Patient awake    Reviewed: Allergy & Precautions, NPO status , Patient's Chart, lab work & pertinent test results  History of Anesthesia Complications (+) PONV and history of anesthetic complications  Airway Mallampati: II  TM Distance: >3 FB Neck ROM: Full    Dental no notable dental hx.    Pulmonary neg pulmonary ROS, former smoker,    Pulmonary exam normal        Cardiovascular negative cardio ROS Normal cardiovascular exam     Neuro/Psych PSYCHIATRIC DISORDERS Anxiety Depression negative neurological ROS     GI/Hepatic negative GI ROS, Neg liver ROS,   Endo/Other  Morbid obesity  Renal/GU negative Renal ROS  negative genitourinary   Musculoskeletal  (+) Arthritis , Osteoarthritis,    Abdominal (+) + obese,   Peds negative pediatric ROS (+)  Hematology negative hematology ROS (+)   Anesthesia Other Findings Anxiety    Depression    Hyperlipidemia    Osteoarthritis    PONV (postoperative nausea and vomiting)  Pre-diabetes       Reproductive/Obstetrics negative OB ROS                            Anesthesia Physical Anesthesia Plan  ASA: 3  Anesthesia Plan: MAC and Spinal   Post-op Pain Management:    Induction:   PONV Risk Score and Plan: 3 and Propofol infusion, Midazolam and Ondansetron  Airway Management Planned: Natural Airway and Mask  Additional Equipment:   Intra-op Plan:   Post-operative Plan:   Informed Consent: I have reviewed the patients History and Physical, chart, labs and discussed the procedure including the risks, benefits and alternatives for the proposed anesthesia with the patient or authorized representative who has indicated his/her understanding and acceptance.       Plan Discussed with: CRNA, Anesthesiologist and Surgeon  Anesthesia Plan Comments:          Anesthesia Quick Evaluation

## 2020-10-14 NOTE — Progress Notes (Signed)
Please let patient know her lab work looks great.  No concerns.  Cholesterol has improved.  Follow up as discussed.

## 2020-10-14 NOTE — Progress Notes (Signed)
PT Cancellation Note  Patient Details Name: Jill Burch MRN: [id number] DOB: 01-25-1961   Cancelled Treatment:    Reason Eval/Treat Not Completed: Patient not medically ready (Chart reviewe, Evaluation attempted. Pt being checked onto unit with NSG. Pt remains somewhat drowsy at this time, reports her operative leg to be numb still. Will defer evlauation at this time, attempt evaluation again at date/time.)  3:43 PM, 10/14/20 Rosamaria Lints, PT, DPT Physical Therapist - [facility]  [phone] (ASCOM)    Naila Elizondo C 10/14/2020, 3:43 PM

## 2020-10-14 NOTE — Plan of Care (Signed)

## 2020-10-14 NOTE — H&P (Signed)
History of Present Illness:  Jill Burch is a 60 y.o. female that presents to clinic today for her preoperative history and evaluation. Patient presents unaccompanied. The patient is scheduled to undergo a left total knee arthroplasty on 10/15/20 by Dr. Joice Lofts. Her pain began several years ago without injury. The pain is located along the medial aspect of the knee. She describes her pain as aching, dull, stabbing, and throbbing. She reports associated swelling and deformity. She denies associated numbness or tingling. The patient's symptoms have progressed to the point that they decrease her quality of life. The patient has previously undergone conservative treatment including NSAIDS and injections to the knee without adequate control of her symptoms. The patient denies history of lumbar surgery, blood clots, or significant cardiac history. She also denies any significant drug allergies. Patient's last A1C was 6.5 on 05/19/20.  Past Medical History:   Anxiety associated with depression   Depression   Past Surgical History:   TUBAL LIGATION   Current Medications:   acetaminophen (TYLENOL) 500 MG tablet Take 500 mg by mouth every 6 (six) hours as needed   diclofenac (VOLTAREN) 1 % topical gel Apply 2 g topically 4 (four) times daily   famotidine (PEPCID) 20 MG tablet Take 20 mg by mouth 2 (two) times daily as needed   lidocaine HCL 4 % lqro Apply topically as needed   multivitamin capsule Take 1 capsule by mouth once daily   rosuvastatin (CRESTOR) 20 MG tablet Take 20 mg by mouth once daily   venlafaxine (EFFEXOR-XR) 150 MG XR capsule Take 150 mg by mouth once daily 0   gabapentin (NEURONTIN) 300 MG capsule Take 300 mg by mouth 2 (two) times daily   rosuvastatin (CRESTOR) 40 MG tablet Take 1 tablet (40 mg total) by mouth once daily 30 tablet 11   Allergies:   Lactose Diarrhea, Bloating, gas   Social History:   Socioeconomic History:   Marital status: Married  Tobacco Use   Smoking status:  Former Smoker   Smokeless tobacco: Never Used  Substance and Sexual Activity   Alcohol use: No   Drug use: No   Sexual activity: Yes  Partners: Male   Family History:   Depression Mother   Arthritis Mother   Diabetes type II Father   Depression Father   Prostate cancer Father   Review of Systems:  A 10+ ROS was performed, reviewed, and the pertinent orthopaedic findings are documented in the HPI.   Physical Examination:  BP (!) 140/80 (BP Location: Left upper arm, Patient Position: Sitting, BP Cuff Size: Large Adult)  Ht 157.5 cm (5\' 2" )  Wt (!) 101 kg (222 lb 9.6 oz)  BMI 40.71 kg/m   Patient is a well-developed, well-nourished female in no acute distress. Patient has normal mood and affect. Patient is alert and oriented to person, place, and time.   HEENT: Atraumatic, normocephalic. Pupils equal and reactive to light. Extraocular motion intact. Noninjected sclera.  Cardiovascular: Regular rate and rhythm, with no murmurs, rubs, or gallops. Distal pulses palpable.  Respiratory: Lungs clear to auscultation bilaterally.   Left knee exam: GAIT: moderate limp, favoring her left leg, but uses no assistive devices. ALIGNMENT: moderate varus SKIN: unremarkable SWELLING: mild EFFUSION: small WARMTH: no warmth TENDERNESS: moderate over the medial joint line ROM: 3 to 130 degrees with mild pain in maximal flexion McMURRAY'S: equivocal PATELLOFEMORAL: normal tracking with no peri-patellar tenderness and negative apprehension sign CREPITUS: Mild patellofemoral crepitance LACHMAN'S: negative PIVOT SHIFT: negative ANTERIOR DRAWER: negative  POSTERIOR DRAWER: negative VARUS/VALGUS: positive pseudolaxity to varus stressing   She is neurovascularly intact to the left lower extremity and foot.  Sensation intact over the saphenous, lateral sural cutaneous, superficial fibular, and deep fibular nerve distributions.  X-rays Left Knee: 3 views of the left knee were reviewed. Images  reveal severe loss of medial compartment joint space with osteophyte formation. No fractures or dislocations. No other osseous abnormality noted  Impression:  1. Primary osteoarthritis of left knee.  2. Severe obesity (BMI >= 40).   Plan:  The treatment options, including both surgical and nonsurgical choices, have been discussed in detail with the patient. The patient would like to proceed with a left total knee arthroplasty. The potential risks (including bleeding, infection, nerve and/or blood vessel injury, persistent or recurrent pain, loosening or failure of the components, leg length inequality, dislocation, need for further surgery, blood clots, strokes, heart attacks or arrhythmias, pneumonia, etc.) and benefits of the surgical procedure were discussed. The patient states her understanding and agrees to proceed. She agrees to a blood transfusion if necessary. A formal written consent will be obtained by the nursing staff.    H&P reviewed and patient re-examined. No changes.

## 2020-10-14 NOTE — Care Management CC44 (Signed)
Condition Code 44 Documentation Completed  Patient Details  Name: TONDALAYA PERREN MRN: [id number] Date of Birth: Aug 03, 1960   Condition Code 44 given:  Yes Patient signature on Condition Code 44 notice:  Yes Documentation of 2 MD's agreement:  Yes Code 44 added to claim:  Yes    Barrie Dunker, RN 10/14/2020, 3:44 PM

## 2020-10-14 NOTE — TOC Progression Note (Signed)
Transition of Care [facility]) - Progression Note    Patient Details  Name: Jill Burch MRN: [id number] Date of Birth: 22-Jul-1960  [facility] of [facility]) CM/SW Contact  Barrie Dunker, RN Phone Number: 10/14/2020, 3:44 PM  Clinical Narrative:     Code 44 completed and reviewed with the patient a signed copy was given to the patient.       Expected Discharge Plan and Services                                                 Social Determinants of Health (SDOH) Interventions    Readmission Risk Interventions No flowsheet data found.

## 2020-10-14 NOTE — Anesthesia Procedure Notes (Signed)
Spinal  Start time: 10/14/2020 7:15 AM End time: 10/14/2020 7:30 AM Staffing Performed: anesthesiologist and other anesthesia staff  Anesthesiologist: Manfred Arch, MD Spinal Block Patient position: sitting Prep: DuraPrep Patient monitoring: continuous pulse ox Approach: midline Location: L4-5 Needle Needle type: Whitacre  Needle gauge: 22 G Additional Notes Pt with h/o congenital lumbar fusion.  SAB attempted at multiple levels, both 25G and 22G but unable to find opening, no CSF.  Aborted and changed to [facility]

## 2020-10-14 NOTE — Anesthesia Procedure Notes (Signed)
Procedure Name: Intubation Date/Time: 10/14/2020 7:44 AM Performed by: Alanson Puls, RN Pre-anesthesia Checklist: Patient identified, Emergency Drugs available, Suction available and Patient being monitored Patient Re-evaluated:Patient Re-evaluated prior to induction Oxygen Delivery Method: Circle system utilized Preoxygenation: Pre-oxygenation with 100% oxygen Induction Type: IV induction Ventilation: Mask ventilation without difficulty Tube type: Oral Tube size: 7.0 mm Number of attempts: 1 Airway Equipment and Method: Stylet and Oral airway Placement Confirmation: ETT inserted through vocal cords under direct vision, positive ETCO2 and breath sounds checked- equal and bilateral Secured at: 21 cm Tube secured with: Tape Dental Injury: Teeth and Oropharynx as per pre-operative assessment

## 2020-10-14 NOTE — Transfer of Care (Signed)
Immediate Anesthesia Transfer of Care Note  Patient: Jill Burch  Procedure(s) Performed: TOTAL KNEE ARTHROPLASTY (Left: Knee)  Patient Location: PACU  Anesthesia Type:General  Level of Consciousness: drowsy  Airway & Oxygen Therapy: Patient Spontanous Breathing and Patient connected to face mask oxygen  Post-op Assessment: Report given to RN and Post -op Vital signs reviewed and stable  Post vital signs: Reviewed and stable  Last Vitals:  Vitals Value Taken Time  BP 90/51 10/14/20 1013  Temp 36.6 C 10/14/20 1009  Pulse 62 10/14/20 1013  Resp 21 10/14/20 1013  SpO2 94 % 10/14/20 1013  Vitals shown include unvalidated device data.  Last Pain:  Vitals:   10/14/20 0614  TempSrc: Oral         Complications: No notable events documented.

## 2020-10-14 NOTE — Anesthesia Postprocedure Evaluation (Signed)
Anesthesia Post Note  Patient: Jill Burch  Procedure(s) Performed: TOTAL KNEE ARTHROPLASTY (Left: Knee) APPLICATION OF WOUND VAC  Patient location during evaluation: PACU Anesthesia Type: General Level of consciousness: awake and alert, awake and oriented Pain management: pain level controlled Vital Signs Assessment: post-procedure vital signs reviewed and stable Respiratory status: spontaneous breathing, nonlabored ventilation and respiratory function stable Cardiovascular status: blood pressure returned to baseline and stable Postop Assessment: no apparent nausea or vomiting Anesthetic complications: no   No notable events documented.   Last Vitals:  Vitals:   10/14/20 1115 10/14/20 1130  BP: (!) 93/54 (!) 99/55  Pulse: 66 (!) 58  Resp: 13 15  Temp: (!) 36.3 C   SpO2: 94% 96%    Last Pain:  Vitals:   10/14/20 1140  TempSrc:   PainSc: 8                  Manfred Arch

## 2020-10-15 ENCOUNTER — Encounter: Payer: Self-pay | Admitting: Surgery

## 2020-10-15 LAB — CBC
HCT: 35.2 % — ABNORMAL LOW (ref 36.0–46.0)
Hemoglobin: 11.6 g/dL — ABNORMAL LOW (ref 12.0–15.0)
MCH: 30.1 pg (ref 26.0–34.0)
MCHC: 33 g/dL (ref 30.0–36.0)
MCV: 91.2 fL (ref 80.0–100.0)
Platelets: 237 10*3/uL (ref 150–400)
RBC: 3.86 MIL/uL — ABNORMAL LOW (ref 3.87–5.11)
RDW: 12.8 % (ref 11.5–15.5)
WBC: 11.8 10*3/uL — ABNORMAL HIGH (ref 4.0–10.5)
nRBC: 0 % (ref 0.0–0.2)

## 2020-10-15 LAB — BASIC METABOLIC PANEL
Anion gap: 6 (ref 5–15)
BUN: 14 mg/dL (ref 8–23)
CO2: 28 mmol/L (ref 22–32)
Calcium: 8.3 mg/dL — ABNORMAL LOW (ref 8.9–10.3)
Chloride: 104 mmol/L (ref 98–111)
Creatinine, Ser: 0.76 mg/dL (ref 0.44–1.00)
GFR, Estimated: >60 mL/min (ref >60)
Glucose, Bld: 130 mg/dL — ABNORMAL HIGH (ref 70–99)
Potassium: 3.9 mmol/L (ref 3.5–5.1)
Sodium: 138 mmol/L (ref 135–145)

## 2020-10-15 MED ORDER — TRAMADOL HCL 50 MG PO TABS: 50.0000 mg | ORAL_TABLET | Freq: Four times a day (QID) | ORAL | 0 refills | Status: DC | PRN

## 2020-10-15 MED ORDER — ONDANSETRON HCL 4 MG PO TABS: 4.0000 mg | ORAL_TABLET | Freq: Four times a day (QID) | ORAL | 0 refills | Status: DC | PRN

## 2020-10-15 MED ORDER — OXYCODONE HCL 5 MG PO TABS: 5.0000 mg | ORAL_TABLET | ORAL | 0 refills | Status: DC | PRN

## 2020-10-15 MED ORDER — APIXABAN 2.5 MG PO TABS: 2.5000 mg | ORAL_TABLET | Freq: Two times a day (BID) | ORAL | 0 refills | Status: DC

## 2020-10-15 NOTE — Plan of Care (Signed)
No acute events during the night. VSS. Pain controlled with scheduled pain meds. CMP used during the night.  Problem: Education: Goal: Knowledge of General Education information will improve Description: Including pain rating scale, medication(s)/side effects and non-pharmacologic comfort measures Outcome: Progressing   Problem: Health Behavior/Discharge Planning: Goal: Ability to manage health-related needs will improve Outcome: Progressing   Problem: Clinical Measurements: Goal: Ability to maintain clinical measurements within normal limits will improve Outcome: Progressing Goal: Will remain free from infection Outcome: Progressing Goal: Diagnostic test results will improve Outcome: Progressing Goal: Respiratory complications will improve Outcome: Progressing Goal: Cardiovascular complication will be avoided Outcome: Progressing   Problem: Activity: Goal: Risk for activity intolerance will decrease Outcome: Progressing   Problem: Nutrition: Goal: Adequate nutrition will be maintained Outcome: Progressing   Problem: Coping: Goal: Level of anxiety will decrease Outcome: Progressing   Problem: Elimination: Goal: Will not experience complications related to bowel motility Outcome: Progressing Goal: Will not experience complications related to urinary retention Outcome: Progressing   Problem: Pain Managment: Goal: General experience of comfort will improve Outcome: Progressing   Problem: Safety: Goal: Ability to remain free from injury will improve Outcome: Progressing   Problem: Skin Integrity: Goal: Risk for impaired skin integrity will decrease Outcome: Progressing   Problem: Education: Goal: Knowledge of the prescribed therapeutic regimen will improve Outcome: Progressing Goal: Individualized Educational Video(s) Outcome: Progressing   Problem: Activity: Goal: Ability to avoid complications of mobility impairment will improve Outcome: Progressing Goal:  Range of joint motion will improve Outcome: Progressing   Problem: Clinical Measurements: Goal: Postoperative complications will be avoided or minimized Outcome: Progressing   Problem: Pain Management: Goal: Pain level will decrease with appropriate interventions Outcome: Progressing   Problem: Skin Integrity: Goal: Will show signs of wound healing Outcome: Progressing

## 2020-10-15 NOTE — Discharge Instructions (Signed)

## 2020-10-15 NOTE — Progress Notes (Signed)
Met with the patient in the room, at the bedside, She will be staying with her sister for recovery, I sent the address to [place], She has a RW at home but needs a 3 in 1, [employer] will deliver to her room prior to DC She is set up with [facility] for [facility] services She does not drive but plans to use [employer] for transportation as she has in the past She can afford her medicaiton

## 2020-10-15 NOTE — Progress Notes (Signed)
  Subjective: 1 Day Post-Op Procedure(s) (LRB): TOTAL KNEE ARTHROPLASTY (Left) APPLICATION OF WOUND VAC Patient reports pain as mild.   Patient is well, and has had no acute complaints or problems Plan is to go Home after hospital stay. Negative for chest pain and shortness of breath Fever: no Gastrointestinal:negative for nausea and vomiting  Objective: Vital signs in last 24 hours: Temp:  [96.5 F (35.8 C)-97.9 F (36.6 C)] 97.7 F (36.5 C) (09/09 0422) Pulse Rate:  [52-70] 53 (09/09 0422) Resp:  [10-21] 17 (09/09 0422) BP: (85-118)/(43-66) 100/53 (09/09 0422) SpO2:  [87 %-99 %] 97 % (09/09 0422)  Intake/Output from previous day:  Intake/Output Summary (Last 24 hours) at 10/15/2020 0751 Last data filed at 10/15/2020 0447 Gross per 24 hour  Intake 2090 ml  Output 1510 ml  Net 580 ml    Intake/Output this shift: No intake/output data recorded.  Labs: Recent Labs    10/15/20 0410  HGB 11.6*   Recent Labs    10/15/20 0410  WBC 11.8*  RBC 3.86*  HCT 35.2*  PLT 237   Recent Labs    10/13/20 1410 10/15/20 0410  NA 143 138  K 4.3 3.9  CL 103 104  CO2 26 28  BUN 16 14  CREATININE 0.83 0.76  GLUCOSE 79 130*  CALCIUM 9.5 8.3*   No results for input(s): LABPT, INR in the last 72 hours.   EXAM General - Patient is Alert, Appropriate, and Oriented Extremity - ABD soft Sensation intact distally Intact pulses distally Dorsiflexion/Plantar flexion intact No cellulitis present Dressing/Incision - Prevena intact without drainage to the left knee without drainage. Motor Function - intact, moving foot and toes well on exam.  Abdomen soft with normal bowel sounds.  Past Medical History:  Diagnosis Date   Anxiety    Depression    Hyperlipidemia    Osteoarthritis    PONV (postoperative nausea and vomiting)    Pre-diabetes     Assessment/Plan: 1 Day Post-Op Procedure(s) (LRB): TOTAL KNEE ARTHROPLASTY (Left) APPLICATION OF WOUND VAC Active Problems:    Status post total knee replacement using cement, left  Estimated body mass index is 40.79 kg/m as calculated from the following:   Height as of this encounter: 5\' 2"  (1.575 m).   Weight as of this encounter: 101.2 kg. Advance diet Up with therapy D/C IV fluids when tolerating po intake.  Labs reviewed, BP 100/53.  Encouraged increase oral intake. Encourage incentive spirometer. Up with therapy today, was unable to work with PT yesterday due to drowsiness. Patient is passing gas, begin working on BM. Possible d/c home today, likely tomorrow with HHPT.  DVT Prophylaxis - Foot Pumps, TED hose, and Eliquis Weight-Bearing as tolerated to left leg  J. , PA-C [facility] Orthopaedic Surgery 10/15/2020, 7:51 AM

## 2020-10-15 NOTE — Progress Notes (Signed)
Physical Therapy Treatment Patient Details Name: Jill Burch MRN: [id number] DOB: 08-01-1960 Today's Date: 10/15/2020    History of Present Illness Jill Burch is a 60yoF who comes to [facility] on 9/8 for elective Lt TKA.    PT Comments    Pt still in recliner upon entry, lunch finished. Pt agreeable to session. Pt voices urgent need to attempt bowel movement, assisted to BR for attempt, only small partial bowel about the size of AA battery. Pt assisted back to recliner, reviewed handout and precautions. Moved to gait training, pt advanced to 154ft AMB (164ft this morning), gait speed much faster. Pt able to advance to 2-point gait with continuous RW push. Pt reports she would feel better about staying one more night prior to DC and taking advantage of stairs training and additional HEP review prior to DC. Pt is a little anxious about attempting stairs at this time but will need to do so to enter her sister's home at DC. Overall pt progressing very well in general.     Follow Up Recommendations  Home health PT     Equipment Recommendations  3in1 (PT)    Recommendations for Other Services       Precautions / Restrictions Precautions Precautions: Fall;Knee Precaution Booklet Issued: Yes (comment) Restrictions Weight Bearing Restrictions: Yes LLE Weight Bearing: Weight bearing as tolerated    Mobility  Bed Mobility Overal bed mobility: Modified Independent             General bed mobility comments: high velocity thrust technique    Transfers Overall transfer level: Needs assistance Equipment used: Rolling walker (2 wheeled) Transfers: Sit to/from Stand           General transfer comment: cues for hands on arm-rest, or arms on [facility]  Ambulation/Gait Ambulation/Gait assistance: Min guard Gait Distance (Feet): 190 Feet Assistive device: Rolling walker (2 wheeled) Gait Pattern/deviations: Antalgic;Step-to pattern Gait velocity: 0.49m/s (0.63m/s)   General Gait Details:  3-point step-to pattern   Stairs Stairs:  (needs to be reviewed next session)           Wheelchair Mobility    Modified Rankin (Stroke Patients Only)       Balance Overall balance assessment: Modified Independent                                          Cognition Arousal/Alertness: Awake/alert Behavior During Therapy: WFL for tasks assessed/performed Overall Cognitive Status: Within Functional Limits for tasks assessed                                        Exercises Total Joint Exercises Ankle Circles/Pumps: AROM;Both;15 reps;Supine Long Arc Quad: AROM;AAROM;Left;15 reps;Limitations;Seated Long Arc Quad Limitations: Jill Burch assists with first 6-7, then patient performs without assist    General Comments        Pertinent Vitals/Pain Pain Assessment: 0-10 Pain Score: 4  Pain Location: operative knee Pain Descriptors / Indicators: Operative site guarding Pain Intervention(s): Limited activity within patient's tolerance;Monitored during session;Premedicated before session    Home Living                      Prior Function            PT Goals (current goals can now be found in the care  plan section) Acute Rehab PT Goals Patient Stated Goal: be able to DC to sisters house and take HHPT there PT Goal Formulation: With patient Time For Goal Achievement: 10/29/20 Potential to Achieve Goals: Good Progress towards PT goals: Progressing toward goals    Frequency    BID      PT Plan Current plan remains appropriate    Co-evaluation              AM-PAC PT "6 Clicks" Mobility   Outcome Measure  Help needed turning from your back to your side while in a flat bed without using bedrails?: A Little Help needed moving from lying on your back to sitting on the side of a flat bed without using bedrails?: A Little Help needed moving to and from a bed to a chair (including a wheelchair)?: A Little Help needed  standing up from a chair using your arms (e.g., wheelchair or bedside chair)?: A Little Help needed to walk in hospital room?: A Little Help needed climbing 3-5 steps with a railing? : A Lot 6 Click Score: 17    End of Session Equipment Utilized During Treatment: Gait belt;Oxygen Activity Tolerance: Patient tolerated treatment well;Patient limited by pain Patient left: with nursing/sitter in room;in bed;with SCD's reapplied   PT Visit Diagnosis: Difficulty in walking, not elsewhere classified (R26.2);Other abnormalities of gait and mobility (R26.89);Muscle weakness (generalized) (M62.81);History of falling (Z91.81)     Time: 1017-5102 PT Time Calculation (min) (ACUTE ONLY): 28 min  Charges:  $Gait Training: 8-22 mins $Therapeutic Exercise: 8-22 mins                    3:06 PM, 10/15/20 Rosamaria Lints, PT, DPT Physical Therapist - [facility]  [phone] (ASCOM)      Jill Burch 10/15/2020, 3:00 PM

## 2020-10-15 NOTE — Evaluation (Signed)
Physical Therapy Evaluation Patient Details Name: Jill Burch MRN: [id number] DOB: 11/05/1960 Today's Date: 10/15/2020   History of Present Illness  Jill Burch is a 60yoF who comes to [facility] on 9/8 for elective Lt TKA.  Clinical Impression  Pt admitted with above diagnosis. Pt currently with functional limitations due to the deficits listed below (see "PT Problem List"). Upon entry, pt in bed, awake and agreeable to participate. The pt is alert, pleasant, interactive, and able to provide info regarding prior level of function, both in tolerance and independence. Educated patient on weight-bearing restrictions, safety precautions, use of brace and polar care, home exercises, use of DME use for basic mobility to perform ADL and access home environment. Heavy effort but no physical assistance required for functional mobility. AMB limited to 164ft due to fatigue and pain. Patient's performance this date reveals decreased ability, independence, and tolerance in performing all basic mobility required for performance of activities of daily living. Pt requires additional DME, close physical assistance, and cues for safe participate in mobility. Pt will benefit from skilled PT intervention to increase independence and safety with basic mobility in preparation for discharge to the venue listed below.       Follow Up Recommendations Home health PT    Equipment Recommendations  3in1 (PT)    Recommendations for Other Services       Precautions / Restrictions Precautions Precautions: Fall;Knee Precaution Booklet Issued: No Restrictions Weight Bearing Restrictions: No      Mobility  Bed Mobility Overal bed mobility: Modified Independent                  Transfers Overall transfer level: Needs assistance Equipment used: Rolling walker (2 wheeled) Transfers: Sit to/from Stand Sit to Stand: Supervision         General transfer comment: cues for hands  Ambulation/Gait Ambulation/Gait  assistance: Min guard Gait Distance (Feet): 100 Feet Assistive device: Rolling walker (2 wheeled) Gait Pattern/deviations: Antalgic;Step-to pattern Gait velocity: 0.46m/s   General Gait Details: 3-point step-to pattern  Stairs            Wheelchair Mobility    Modified Rankin (Stroke Patients Only)       Balance Overall balance assessment: Modified Independent                                           Pertinent Vitals/Pain Pain Assessment: 0-10 Pain Score: 4  Pain Location: operativew knee Pain Descriptors / Indicators: Operative site guarding Pain Intervention(s): Limited activity within patient's tolerance;Monitored during session    Home Living Family/patient expects to be discharged to:: Private residence Living Arrangements: Other relatives (Will DC to sisters home for week 1, then her home with help from husband) Available Help at Discharge: Family Type of Home: House Home Access: Stairs to enter Entrance Stairs-Rails: None Entrance Stairs-Number of Steps: 2 Home Layout: One level Home Equipment: Environmental consultant - 2 wheels;Walker - 4 wheels      Prior Function Level of Independence: Independent with assistive device(s)         Comments: 2 years of limited community distance AMB with knee brace and pain; 2 close calls, no falls; Hx fof bimanual neurological weakness, chronic tinnitus bilat Lt > Rt     Hand Dominance   Dominant Hand: Left    Extremity/Trunk Assessment  Communication      Cognition                                              General Comments      Exercises Total Joint Exercises Ankle Circles/Pumps: AROM;Both;15 reps;Supine Short Arc Quad: AAROM;Left;10 reps;Supine Heel Slides: AAROM;Left;10 reps;Supine Hip ABduction/ADduction: AAROM;Left;10 reps;Supine Goniometric ROM: 10-76 degrees Left knee flexion ROM   Assessment/Plan    PT Assessment Patient needs continued PT  services  PT Problem List Decreased strength;Decreased range of motion;Decreased balance;Decreased activity tolerance;Decreased mobility;Decreased knowledge of use of DME;Decreased knowledge of precautions;Cardiopulmonary status limiting activity       PT Treatment Interventions DME instruction;Gait training;Stair training;Functional mobility training;Therapeutic activities;Therapeutic exercise;Balance training;Neuromuscular re-education;Cognitive remediation;Patient/family education;Wheelchair mobility training    PT Goals (Current goals can be found in the Care Plan section)  Acute Rehab PT Goals Patient Stated Goal: be able to DC to sisters house and take HHPT there PT Goal Formulation: With patient Time For Goal Achievement: 10/29/20 Potential to Achieve Goals: Good    Frequency BID   Barriers to discharge Inaccessible home environment      Co-evaluation               AM-PAC PT "6 Clicks" Mobility  Outcome Measure Help needed turning from your back to your side while in a flat bed without using bedrails?: A Little Help needed moving from lying on your back to sitting on the side of a flat bed without using bedrails?: A Little Help needed moving to and from a bed to a chair (including a wheelchair)?: A Little Help needed standing up from a chair using your arms (e.g., wheelchair or bedside chair)?: A Little Help needed to walk in hospital room?: A Little Help needed climbing 3-5 steps with a railing? : A Lot 6 Click Score: 17    End of Session Equipment Utilized During Treatment: Gait belt;Oxygen Activity Tolerance: Patient tolerated treatment well;Patient limited by pain Patient left: in chair;with call bell/phone within reach;with nursing/sitter in room   PT Visit Diagnosis: Difficulty in walking, not elsewhere classified (R26.2);Other abnormalities of gait and mobility (R26.89);Muscle weakness (generalized) (M62.81);History of falling (Z91.81)    Time:  0916-1002 PT Time Calculation (min) (ACUTE ONLY): 46 min   Charges:   PT Evaluation $PT Eval Low Complexity: 1 Low PT Treatments $Gait Training: 8-22 mins $Therapeutic Exercise: 8-22 mins       10:18 AM, 10/15/20 Rosamaria Lints, PT, DPT Physical Therapist - [facility]  [phone] (ASCOM)    Teya Otterson C 10/15/2020, 10:14 AM

## 2020-10-16 LAB — BASIC METABOLIC PANEL
Anion gap: 4 — ABNORMAL LOW (ref 5–15)
BUN: 16 mg/dL (ref 8–23)
CO2: 31 mmol/L (ref 22–32)
Calcium: 8.5 mg/dL — ABNORMAL LOW (ref 8.9–10.3)
Chloride: 106 mmol/L (ref 98–111)
Creatinine, Ser: 0.46 mg/dL (ref 0.44–1.00)
GFR, Estimated: >60 mL/min (ref >60)
Glucose, Bld: 96 mg/dL (ref 70–99)
Potassium: 4.1 mmol/L (ref 3.5–5.1)
Sodium: 141 mmol/L (ref 135–145)

## 2020-10-16 LAB — CBC
HCT: 35 % — ABNORMAL LOW (ref 36.0–46.0)
Hemoglobin: 11.3 g/dL — ABNORMAL LOW (ref 12.0–15.0)
MCH: 29.8 pg (ref 26.0–34.0)
MCHC: 32.3 g/dL (ref 30.0–36.0)
MCV: 92.3 fL (ref 80.0–100.0)
Platelets: 234 10*3/uL (ref 150–400)
RBC: 3.79 MIL/uL — ABNORMAL LOW (ref 3.87–5.11)
RDW: 13.1 % (ref 11.5–15.5)
WBC: 8.8 10*3/uL (ref 4.0–10.5)
nRBC: 0 % (ref 0.0–0.2)

## 2020-10-16 LAB — GLUCOSE, CAPILLARY: Glucose-Capillary: 85 mg/dL (ref 70–99)

## 2020-10-16 NOTE — TOC Progression Note (Signed)
Transition of Care [facility]) - Progression Note    Patient Details  Name: Jill Burch MRN: [id number] Date of Birth: 1960-07-05  [facility]) CM/SW Contact  Bing Quarry, RN Phone Number: 10/16/2020, 4:02 PM  Clinical Narrative:   9/10: Wound vac in place prn per RN assessment.Still weak/pain control. Refusing CPM per provider. Per PT disposition may change to SNF if no improvement.Will continue to monitor. Gabriel Cirri RN CM     Expected Discharge Plan: Home w Home Health Services Barriers to Discharge: Continued Medical Work up  Expected Discharge Plan and Services Expected Discharge Plan: Home w Home Health Services   Discharge Planning Services: CM Consult   Living arrangements for the past 2 months: Single Family Home                 DME Arranged: 3-N-1 DME Agency: [employer] Date DME Agency Contacted: 10/15/20 Time DME Agency Contacted: 1102 Representative spoke with at DME Agency: Bjorn Loser HH Arranged: PT HH Agency: [facility] Date [facility] Agency Contacted: 10/15/20 Time HH Agency Contacted: 1103 Representative spoke with at [facility] Agency: [place]   Social Determinants of Health (SDOH) Interventions    Readmission Risk Interventions No flowsheet data found.

## 2020-10-16 NOTE — Plan of Care (Signed)
No acute events during the night. VSS. PRN pain medications administered. Refused CPM because of pain. Wound vac intact.  Problem: Education: Goal: Knowledge of General Education information will improve Description: Including pain rating scale, medication(s)/side effects and non-pharmacologic comfort measures Outcome: Progressing   Problem: Health Behavior/Discharge Planning: Goal: Ability to manage health-related needs will improve Outcome: Progressing   Problem: Clinical Measurements: Goal: Ability to maintain clinical measurements within normal limits will improve Outcome: Progressing Goal: Will remain free from infection Outcome: Progressing Goal: Diagnostic test results will improve Outcome: Progressing Goal: Respiratory complications will improve Outcome: Progressing Goal: Cardiovascular complication will be avoided Outcome: Progressing   Problem: Activity: Goal: Risk for activity intolerance will decrease Outcome: Progressing   Problem: Nutrition: Goal: Adequate nutrition will be maintained Outcome: Progressing   Problem: Coping: Goal: Level of anxiety will decrease Outcome: Progressing   Problem: Elimination: Goal: Will not experience complications related to bowel motility Outcome: Progressing Goal: Will not experience complications related to urinary retention Outcome: Progressing   Problem: Pain Managment: Goal: General experience of comfort will improve Outcome: Progressing   Problem: Safety: Goal: Ability to remain free from injury will improve Outcome: Progressing   Problem: Skin Integrity: Goal: Risk for impaired skin integrity will decrease Outcome: Progressing   Problem: Education: Goal: Knowledge of the prescribed therapeutic regimen will improve Outcome: Progressing Goal: Individualized Educational Video(s) Outcome: Progressing   Problem: Activity: Goal: Ability to avoid complications of mobility impairment will improve Outcome:  Progressing Goal: Range of joint motion will improve Outcome: Progressing   Problem: Clinical Measurements: Goal: Postoperative complications will be avoided or minimized Outcome: Progressing   Problem: Pain Management: Goal: Pain level will decrease with appropriate interventions Outcome: Progressing   Problem: Skin Integrity: Goal: Will show signs of wound healing Outcome: Progressing

## 2020-10-16 NOTE — Progress Notes (Signed)
Physical Therapy Treatment Patient Details Name: Jill Burch MRN: [id number] DOB: [birth year] Today's Date: 10/16/2020    History of Present Illness Jill Burch is a 60yoF who comes to [facility] on 9/8 for elective Lt TKA.    PT Comments    Pt was long sitting in bed upon arriving. Endorsing much more severe pain today versus previous date." I think I did too much yesterday." Author discussed need to continue to perform PT to continue to progress to goals. Pt required much more assistance today versus previously observed. Session greatly limited by pain and BP concerns. BP dropped in standing when going to recliner to 83/59 with pt reporting symptoms. She was repositioned in recliner with RN aware of concerns.   Follow Up Recommendations  Home health PT     Equipment Recommendations  3in1 (PT)       Precautions / Restrictions Precautions Precautions: Fall;Knee Precaution Booklet Issued: Yes (comment) Restrictions Weight Bearing Restrictions: No LLE Weight Bearing: Weight bearing as tolerated    Mobility  Bed Mobility Overal bed mobility: Needs Assistance Bed Mobility: Supine to Sit     Supine to sit: Mod assist     General bed mobility comments: Pt required alot more assistance to be able to achieve EOB short sit. Unable to progress LLE without mod assist. pain limited.    Transfers Overall transfer level: Needs assistance Equipment used: Rolling walker (2 wheeled) Transfers: Sit to/from Stand Sit to Stand: Min assist;Mod assist;From elevated surface         General transfer comment: Pt also required alot more assistance to achieve standing. Vcs for handplacement, fwd wt shift, and overall technique improvements  Ambulation/Gait Ambulation/Gait assistance: Min assist Gait Distance (Feet): 5 Feet Assistive device: Rolling walker (2 wheeled) Gait Pattern/deviations: Antalgic;Step-to pattern Gait velocity: decreased   General Gait Details: Pt c/o dizziness and feeling  poorly upon standing. was able to take steps to chair however BP once in recliner 83/59. Pt endorses symptoms and severe fatigue. RN aware.    Balance Overall balance assessment: Needs assistance Sitting-balance support: Feet supported Sitting balance-Leahy Scale: Good     Standing balance support: Bilateral upper extremity supported;During functional activity Standing balance-Leahy Scale: Fair Standing balance comment: reliant on UE support throughout all standing task. Heavy reliance with dynamic task      Cognition Arousal/Alertness: Awake/alert Behavior During Therapy: WFL for tasks assessed/performed Overall Cognitive Status: Within Functional Limits for tasks assessed        General Comments: Pt is A and O but c/o sever pain throughout session. Has orthostatic hypotensive concerns             Pertinent Vitals/Pain Pain Assessment: 0-10 Pain Score: 10-Worst pain ever Pain Location: operative knee Pain Intervention(s): Limited activity within patient's tolerance;Monitored during session;Premedicated before session;Repositioned;Ice applied     PT Goals (current goals can now be found in the care plan section) Acute Rehab PT Goals Patient Stated Goal: decrease pain and return home with family Progress towards PT goals: Not progressing toward goals - comment (BP limited and pain limited)    Frequency    BID      PT Plan Current plan remains appropriate;Other (comment) (May need to change DC recs if unable to perform better next few sessions. Pt did well on POD1)       AM-PAC PT "6 Clicks" Mobility   Outcome Measure  Help needed turning from your back to your side while in a flat bed without using bedrails?: A Little  Help needed moving from lying on your back to sitting on the side of a flat bed without using bedrails?: A Lot Help needed moving to and from a bed to a chair (including a wheelchair)?: A Little Help needed standing up from a chair using your arms  (e.g., wheelchair or bedside chair)?: A Lot Help needed to walk in hospital room?: A Lot Help needed climbing 3-5 steps with a railing? : A Lot 6 Click Score: 14    End of Session Equipment Utilized During Treatment: Gait belt (discontinued O2 prior to session) Activity Tolerance: Patient limited by fatigue;Patient limited by pain;Other (comment) (limited by BP concerns) Patient left: in chair;with chair alarm set;with call bell/phone within reach Nurse Communication: Mobility status PT Visit Diagnosis: Difficulty in walking, not elsewhere classified (R26.2);Other abnormalities of gait and mobility (R26.89);Muscle weakness (generalized) (M62.81);History of falling (Z91.81)     Time: 4098-1191 PT Time Calculation (min) (ACUTE ONLY): 13 min  Charges:  $Therapeutic Activity: 8-22 mins                     Jetta Lout PTA 10/16/20, 1:22 PM

## 2020-10-16 NOTE — Progress Notes (Signed)
Physical Therapy Treatment Patient Details Name: Jill Burch MRN: [id number] DOB: May 15, 1960 Today's Date: 10/16/2020    History of Present Illness Jill Burch is a 60yoF who comes to [facility] on 9/8 for elective Lt TKA.    PT Comments    Pt was still in recliner upon arriving and has been since AM session. She continues to feel poorly overall today and is unable to tolerate ambulation or activity that she was able to tolerate yesterday. Severely pain limited in addition to also having BP concerns. Current recommendation is for home however if she does not progress over next session, will need DC disposition recommendation changed to SNF.    Follow Up Recommendations  Home health PT;Other (comment) (ongoing assessment)     Equipment Recommendations  3in1 (PT)       Precautions / Restrictions Precautions Precautions: Fall;Knee Precaution Booklet Issued: Yes (comment) Restrictions Weight Bearing Restrictions: Yes LLE Weight Bearing: Weight bearing as tolerated    Mobility  Bed Mobility Overal bed mobility: Needs Assistance Bed Mobility: Sit to Supine     Supine to sit: Mod assist Sit to supine: Mod assist   General bed mobility comments: Pt required more assistance top progress LEs into bed. Pt evry lethargic and having symptoms of orthostatic hypotension with getting back to bed.    Transfers Overall transfer level: Needs assistance Equipment used: Rolling walker (2 wheeled) Transfers: Sit to/from Stand Sit to Stand: Mod assist         General transfer comment: Mod assist required to stand from lower recliner height. pt struggles and endorses dizziness again. once back to EOB BP 99/59. Pt very lethargic and required mod assist to progress back into bed. severly limited today versus previous date.  Ambulation/Gait Ambulation/Gait assistance: Min assist Gait Distance (Feet): 5 Feet Assistive device: Rolling walker (2 wheeled) Gait Pattern/deviations: Antalgic;Step-to  pattern Gait velocity: decreased   General Gait Details: limited gait distance 2/2 to dizziness/pain/ and pt struggling to tolerate wt bearing activity    Balance Overall balance assessment: Needs assistance Sitting-balance support: Feet supported Sitting balance-Leahy Scale: Good     Standing balance support: Bilateral upper extremity supported;During functional activity Standing balance-Leahy Scale: Fair Standing balance comment: reliant on UE support throughout all standing task. Heavy reliance with dynamic task      Cognition Arousal/Alertness: Lethargic Behavior During Therapy: WFL for tasks assessed/performed Overall Cognitive Status: Within Functional Limits for tasks assessed      General Comments: Pt is more fatigued and more lethargic this afternoon versus AM session. Did not recieve pain emds in hopes of improving BP however pt continues to be limited by pain/BP concerns      Exercises Total Joint Exercises Goniometric ROM: unable to perform 2/2 to pain        Pertinent Vitals/Pain Pain Assessment: 0-10 Pain Score: 10-Worst pain ever Pain Location: operative knee Pain Intervention(s): Limited activity within patient's tolerance;Monitored during session;Repositioned;Ice applied     PT Goals (current goals can now be found in the care plan section) Acute Rehab PT Goals Patient Stated Goal: decrease pain and return home with family Progress towards PT goals: Not progressing toward goals - comment    Frequency    BID      PT Plan Other (comment) (PA made aware that pt is not safe from a  PT standpoint to DC home today. Author hopes pt will perform better next session and will still be appropriate for home with HHPT. If pt continues to show current  deficits, will need SNF.)       AM-PAC PT "6 Clicks" Mobility   Outcome Measure  Help needed turning from your back to your side while in a flat bed without using bedrails?: A Little Help needed moving from  lying on your back to sitting on the side of a flat bed without using bedrails?: A Lot Help needed moving to and from a bed to a chair (including a wheelchair)?: A Little Help needed standing up from a chair using your arms (e.g., wheelchair or bedside chair)?: A Lot Help needed to walk in hospital room?: A Lot Help needed climbing 3-5 steps with a railing? : A Lot 6 Click Score: 14    End of Session Equipment Utilized During Treatment: Gait belt (discontinued O2 prior to session) Activity Tolerance: Patient limited by fatigue;Patient limited by lethargy Patient left: in bed;with call bell/phone within reach;with bed alarm set Nurse Communication: Mobility status PT Visit Diagnosis: Difficulty in walking, not elsewhere classified (R26.2);Other abnormalities of gait and mobility (R26.89);Muscle weakness (generalized) (M62.81);History of falling (Z91.81)     Time: 4193-7902 PT Time Calculation (min) (ACUTE ONLY): 13 min  Charges:  $Therapeutic Activity: 8-22 mins                     Jetta Lout PTA 10/16/20, 1:59 PM

## 2020-10-16 NOTE — Progress Notes (Signed)
  Subjective: 2 Days Post-Op Procedure(s) (LRB): TOTAL KNEE ARTHROPLASTY (Left) APPLICATION OF WOUND VAC Patient reports pain as moderate.   Patient is well, and has had no acute complaints or problems Plan is to go Home after hospital stay. Negative for chest pain and shortness of breath Fever: no Gastrointestinal: negative for nausea and vomiting.  Patient has not had a bowel movement.  Objective: Vital signs in last 24 hours: Temp:  [97.8 F (36.6 C)-98.4 F (36.9 C)] 98.1 F (36.7 C) (09/10 1129) Pulse Rate:  [60-75] 75 (09/10 1129) Resp:  [14-16] 16 (09/10 1129) BP: (104-136)/(53-75) 115/70 (09/10 1129) SpO2:  [96 %-100 %] 98 % (09/10 1129)  Intake/Output from previous day:  Intake/Output Summary (Last 24 hours) at 10/16/2020 1152 Last data filed at 10/16/2020 1035 Gross per 24 hour  Intake 240 ml  Output 200 ml  Net 40 ml    Intake/Output this shift: Total I/O In: 240 [P.O.:240] Out: -   Labs: Recent Labs    10/15/20 0410 10/16/20 0450  HGB 11.6* 11.3*   Recent Labs    10/15/20 0410 10/16/20 0450  WBC 11.8* 8.8  RBC 3.86* 3.79*  HCT 35.2* 35.0*  PLT 237 234   Recent Labs    10/15/20 0410 10/16/20 0450  NA 138 141  K 3.9 4.1  CL 104 106  CO2 28 31  BUN 14 16  CREATININE 0.76 0.46  GLUCOSE 130* 96  CALCIUM 8.3* 8.5*   No results for input(s): LABPT, INR in the last 72 hours.   EXAM General - Patient is Alert, Appropriate, and Oriented Extremity - Neurovascular intact Dorsiflexion/Plantar flexion intact Compartment soft Dressing/Incision -Prevena in place, no drainage noted  Motor Function - intact, moving foot and toes well on exam. Able to perform SLR with assistance.   Cardiovascular- Regular rate and rhythm, no murmurs/rubs/gallops Respiratory- Lungs clear to auscultation bilaterally Gastrointestinal- soft, nontender, and active bowel sounds   Assessment/Plan: 2 Days Post-Op Procedure(s) (LRB): TOTAL KNEE ARTHROPLASTY  (Left) APPLICATION OF WOUND VAC Active Problems:   Status post total knee replacement using cement, left  Estimated body mass index is 40.79 kg/m as calculated from the following:   Height as of this encounter: 5\' 2"  (1.575 m).   Weight as of this encounter: 101.2 kg. Advance diet Up with therapy   Labs reviewed. Plan for discharge today pending completion of PT goals (stairs)  Will change to Prevena home unit prior to discharge  DVT Prophylaxis - Eliquis, Ted hose, and foot pumps Weight-Bearing as tolerated to left leg  , PA-C [facility] 10/16/2020, 11:52 AM

## 2020-10-17 DIAGNOSIS — Z6841 Body Mass Index (BMI) 40.0 and over, adult: Secondary | ICD-10-CM | POA: Diagnosis not present

## 2020-10-17 DIAGNOSIS — I951 Orthostatic hypotension: Secondary | ICD-10-CM | POA: Diagnosis not present

## 2020-10-17 DIAGNOSIS — Z8261 Family history of arthritis: Secondary | ICD-10-CM | POA: Diagnosis not present

## 2020-10-17 DIAGNOSIS — Z8042 Family history of malignant neoplasm of prostate: Secondary | ICD-10-CM | POA: Diagnosis not present

## 2020-10-17 DIAGNOSIS — F418 Other specified anxiety disorders: Secondary | ICD-10-CM | POA: Diagnosis present

## 2020-10-17 DIAGNOSIS — E785 Hyperlipidemia, unspecified: Secondary | ICD-10-CM | POA: Diagnosis present

## 2020-10-17 DIAGNOSIS — Z23 Encounter for immunization: Secondary | ICD-10-CM | POA: Diagnosis not present

## 2020-10-17 DIAGNOSIS — Z87891 Personal history of nicotine dependence: Secondary | ICD-10-CM | POA: Diagnosis not present

## 2020-10-17 DIAGNOSIS — M1712 Unilateral primary osteoarthritis, left knee: Secondary | ICD-10-CM | POA: Diagnosis present

## 2020-10-17 DIAGNOSIS — Z833 Family history of diabetes mellitus: Secondary | ICD-10-CM | POA: Diagnosis not present

## 2020-10-17 LAB — BASIC METABOLIC PANEL
Anion gap: 6 (ref 5–15)
BUN: 11 mg/dL (ref 8–23)
CO2: 33 mmol/L — ABNORMAL HIGH (ref 22–32)
Calcium: 8.7 mg/dL — ABNORMAL LOW (ref 8.9–10.3)
Chloride: 100 mmol/L (ref 98–111)
Creatinine, Ser: 0.73 mg/dL (ref 0.44–1.00)
GFR, Estimated: >60 mL/min (ref >60)
Glucose, Bld: 100 mg/dL — ABNORMAL HIGH (ref 70–99)
Potassium: 4.1 mmol/L (ref 3.5–5.1)
Sodium: 139 mmol/L (ref 135–145)

## 2020-10-17 MED ORDER — ACETAMINOPHEN 500 MG PO TABS
1000.0000 mg | ORAL_TABLET | Freq: Three times a day (TID) | ORAL | Status: DC
  Administered 2020-10-17 – 2020-10-18 (×4): 1000 mg via ORAL
  Filled 2020-10-17 (×4): qty 2

## 2020-10-17 MED ORDER — SODIUM CHLORIDE 0.9 % IV BOLUS
500.0000 mL | Freq: Once | INTRAVENOUS | Status: AC
  Administered 2020-10-17: 500 mL via INTRAVENOUS

## 2020-10-17 MED ORDER — TRAMADOL HCL 50 MG PO TABS
50.0000 mg | ORAL_TABLET | ORAL | Status: DC | PRN
  Administered 2020-10-17 – 2020-10-18 (×2): 50 mg via ORAL
  Filled 2020-10-17 (×2): qty 1

## 2020-10-17 NOTE — Progress Notes (Signed)
  Subjective: 3 Days Post-Op Procedure(s) (LRB): TOTAL KNEE ARTHROPLASTY (Left) APPLICATION OF WOUND VAC Patient reports pain as moderate but severe when placing weight on the knee.  Patient is having problems with lightheadedness and dizziness when standing.  Plan is to go Home after hospital stay but recs may change to rehab if patient cannot progress.  Negative for chest pain and shortness of breath Fever: no Gastrointestinal: negative for nausea and vomiting.  Patient has not had a bowel movement.  Objective: Vital signs in last 24 hours: Temp:  [98.1 F (36.7 C)-98.5 F (36.9 C)] 98.2 F (36.8 C) (09/11 0351) Pulse Rate:  [70-76] 70 (09/11 0351) Resp:  [16-21] 20 (09/11 0351) BP: (103-118)/(62-80) 109/80 (09/11 0351) SpO2:  [91 %-98 %] 95 % (09/11 0351)  Intake/Output from previous day:  Intake/Output Summary (Last 24 hours) at 10/17/2020 1038 Last data filed at 10/17/2020 1016 Gross per 24 hour  Intake 720 ml  Output 600 ml  Net 120 ml    Intake/Output this shift: Total I/O In: 240 [P.O.:240] Out: -   Labs: Recent Labs    10/15/20 0410 10/16/20 0450  HGB 11.6* 11.3*   Recent Labs    10/15/20 0410 10/16/20 0450  WBC 11.8* 8.8  RBC 3.86* 3.79*  HCT 35.2* 35.0*  PLT 237 234   Recent Labs    10/16/20 0450 10/17/20 0435  NA 141 139  K 4.1 4.1  CL 106 100  CO2 31 33*  BUN 16 11  CREATININE 0.46 0.73  GLUCOSE 96 100*  CALCIUM 8.5* 8.7*   No results for input(s): LABPT, INR in the last 72 hours.   EXAM General - Patient is Alert, Appropriate, and Oriented Extremity - Neurovascular intact Dorsiflexion/Plantar flexion intact Compartment soft Dressing/Incision -Prevena in place, no drainage noted in cannister  Motor Function - intact, moving foot and toes well on exam.    Cardiovascular- Regular rate and rhythm, no murmurs/rubs/gallops Respiratory-  minimal crackles in lower lung fields, otherwise clear bilaterally  Gastrointestinal- soft,  nontender, and active bowel sounds   Assessment/Plan: 3 Days Post-Op Procedure(s) (LRB): TOTAL KNEE ARTHROPLASTY (Left) APPLICATION OF WOUND VAC Active Problems:   Status post total knee replacement using cement, left  Estimated body mass index is 40.79 kg/m as calculated from the following:   Height as of this encounter: 5\' 2"  (1.575 m).   Weight as of this encounter: 101.2 kg. Advance diet Up with therapy  Discussed adjusting pain regimen to try to decreased 10mg  oxycodones to 5 mgs. Will change tramadol to q4h and add Tylenol 1000mg  TID.   500 mL bolus of NaCl also ordered.       DVT Prophylaxis - Eliquis, Ted hose, and foot pumps Weight-Bearing as tolerated to left leg  , PA-C [facility] 10/17/2020, 10:38 AM

## 2020-10-17 NOTE — Progress Notes (Signed)
Physical Therapy Treatment Patient Details Name: Jill Burch MRN: [id number] DOB: Dec 05, 1960 Today's Date: 10/17/2020    History of Present Illness Jill Burch is a 60yoF who comes to [facility] on 9/8 for elective Lt TKA. She has PMHx significant for anxiety associated with depression.    PT Comments    The pt is presenting with significantly improved functional mobility this session when compared to latest session. Orthostatics completed during session with pt c/o symptoms of dizziness, see below. At this time the pt will require at least 1-2 more PT sessions for continued gait training and stair negotiation training in order to safely d/c home with 24/7 assistance and HHPT.    Follow Up Recommendations  Home health PT;Supervision/Assistance - 24 hour     Equipment Recommendations  3in1 (PT)    Recommendations for Other Services       Precautions / Restrictions Precautions Precautions: Fall;Knee Restrictions Weight Bearing Restrictions: Yes LLE Weight Bearing: Weight bearing as tolerated    Mobility  Bed Mobility Overal bed mobility: Needs Assistance Bed Mobility: Supine to Sit;Sit to Supine     Supine to sit: Mod assist;HOB elevated (RLE management.) Sit to supine: Mod assist (RLE management.)        Transfers Overall transfer level: Needs assistance Equipment used: Rolling walker (2 wheeled) Transfers: Sit to/from Stand Sit to Stand: Min assist            Ambulation/Gait Ambulation/Gait assistance: Min assist Gait Distance (Feet): 30 Feet Assistive device: Rolling walker (2 wheeled) Gait Pattern/deviations: Step-to pattern;Decreased weight shift to right;Decreased step length - left     General Gait Details: Pt presenting with increased knee flexion on the R during initial contact and weight acceptance.   Stairs             Wheelchair Mobility    Modified Rankin (Stroke Patients Only)       Balance Overall balance assessment: Needs  assistance   Sitting balance-Leahy Scale: Good       Standing balance-Leahy Scale: Fair                              Cognition Arousal/Alertness: Awake/alert Behavior During Therapy: WFL for tasks assessed/performed Overall Cognitive Status: Within Functional Limits for tasks assessed                                        Exercises Other Exercises Other Exercises: Pt reporting potential need for BM. Assisted to bathroom for toileting needs.    General Comments        Pertinent Vitals/Pain Pain Score: 6  Pain Location: operative knee Pain Intervention(s): Monitored during session    Home Living                      Prior Function            PT Goals (current goals can now be found in the care plan section) Acute Rehab PT Goals Patient Stated Goal: decrease pain and return home with family PT Goal Formulation: With patient Time For Goal Achievement: 10/29/20 Potential to Achieve Goals: Good Progress towards PT goals: Progressing toward goals    Frequency    BID      PT Plan Current plan remains appropriate    Co-evaluation  AM-PAC PT "6 Clicks" Mobility   Outcome Measure  Help needed turning from your back to your side while in a flat bed without using bedrails?: A Little Help needed moving from lying on your back to sitting on the side of a flat bed without using bedrails?: A Lot Help needed moving to and from a bed to a chair (including a wheelchair)?: A Little Help needed standing up from a chair using your arms (e.g., wheelchair or bedside chair)?: A Little Help needed to walk in hospital room?: A Little Help needed climbing 3-5 steps with a railing? : A Lot 6 Click Score: 16    End of Session Equipment Utilized During Treatment: Gait belt Activity Tolerance: Patient limited by fatigue;Patient limited by lethargy Patient left: in bed;with call bell/phone within reach Nurse Communication:  Mobility status PT Visit Diagnosis: Difficulty in walking, not elsewhere classified (R26.2);Other abnormalities of gait and mobility (R26.89);Muscle weakness (generalized) (M62.81);History of falling (Z91.81)     Time: 1421-1500 PT Time Calculation (min) (ACUTE ONLY): 39 min  Charges:  $Gait Training: 8-22 mins $Therapeutic Activity: 23-37 mins                     3:28 PM, 10/17/20 Gabrielly Mccrystal A. Mordecai Maes PT, DPT Physical Therapist - [facility]    Constantina Laseter A Sakeenah Valcarcel 10/17/2020, 3:25 PM

## 2020-10-18 ENCOUNTER — Telehealth: Payer: Self-pay

## 2020-10-18 MED ORDER — ACETAMINOPHEN 500 MG PO TABS: 1000.0000 mg | ORAL_TABLET | Freq: Four times a day (QID) | ORAL | 0 refills | Status: AC | PRN

## 2020-10-18 NOTE — Progress Notes (Signed)
Physical Therapy Treatment Patient Details Name: Jill Burch MRN: [id number] DOB: 05-24-1960 Today's Date: 10/18/2020   History of Present Illness Jill Burch is a 60yoF who comes to [facility] on 9/8 for elective Lt TKA. She has PMHx significant for anxiety associated with depression.    PT Comments    Up in chair, ready for session.  Orthostatic BP's taken.  Stable today. In flow sheets.  She is able to walk 40' before needing to sit due to some dizziness 2/10.  BP remained at baseline of 104/65.  She is able to walk to gym for stair training and then continued gait.  A total of 160' with 3 seated rest breaks during session.  Overall does well.  Participated in exercises as described below. Pt and sister who will be caregiver educated on mobility, safety and expectations of recovery.  Questions answered and they feel prepared for discharge.  Will discuss with RN and PA.   Recommendations for follow up therapy are one component of a multi-disciplinary discharge planning process, led by the attending physician.  Recommendations may be updated based on patient status, additional functional criteria and insurance authorization.  Follow Up Recommendations  Home health PT;Supervision/Assistance - 24 hour     Equipment Recommendations  3in1 (PT)    Recommendations for Other Services       Precautions / Restrictions Precautions Precautions: Fall;Knee Precaution Booklet Issued: Yes (comment) Restrictions Weight Bearing Restrictions: Yes LLE Weight Bearing: Weight bearing as tolerated     Mobility  Bed Mobility               General bed mobility comments: in recliner before and after    Transfers Overall transfer level: Needs assistance Equipment used: Rolling walker (2 wheeled) Transfers: Sit to/from Stand Sit to Stand: Min guard            Ambulation/Gait Ambulation/Gait assistance: Min guard Gait Distance (Feet): 160 Feet Assistive device: Rolling walker (2  wheeled) Gait Pattern/deviations: Step-to pattern;Decreased weight shift to right;Decreased step length - left Gait velocity: decreased   General Gait Details: 3 seated rest breaks during gait.  x 1 due to dizziness, x 2 due to fatigue   Stairs Stairs: Yes Stairs assistance: Min guard Stair Management: Step to pattern;With walker;Forwards Number of Stairs: 2 General stair comments: threshold step with walker - did well   Wheelchair Mobility    Modified Rankin (Stroke Patients Only)       Balance Overall balance assessment: Needs assistance Sitting-balance support: Feet supported Sitting balance-Leahy Scale: Good     Standing balance support: Bilateral upper extremity supported;During functional activity Standing balance-Leahy Scale: Fair                              Cognition Arousal/Alertness: Awake/alert Behavior During Therapy: WFL for tasks assessed/performed Overall Cognitive Status: Within Functional Limits for tasks assessed                                        Exercises Total Joint Exercises Goniometric ROM: 0-85 Other Exercises Other Exercises: standing and seated AROM    General Comments        Pertinent Vitals/Pain Pain Assessment: Faces Faces Pain Scale: Hurts a little bit Pain Descriptors / Indicators: Sore Pain Intervention(s): Limited activity within patient's tolerance;Monitored during session;Repositioned;Ice applied    Home Living  Prior Function            PT Goals (current goals can now be found in the care plan section) Progress towards PT goals: Progressing toward goals    Frequency    BID      PT Plan Current plan remains appropriate    Co-evaluation              AM-PAC PT "6 Clicks" Mobility   Outcome Measure  Help needed turning from your back to your side while in a flat bed without using bedrails?: A Little Help needed moving from lying on your  back to sitting on the side of a flat bed without using bedrails?: A Little Help needed moving to and from a bed to a chair (including a wheelchair)?: A Little Help needed standing up from a chair using your arms (e.g., wheelchair or bedside chair)?: A Little Help needed to walk in hospital room?: A Little Help needed climbing 3-5 steps with a railing? : A Little 6 Click Score: 18    End of Session Equipment Utilized During Treatment: Gait belt Activity Tolerance: Patient tolerated treatment well Patient left: in chair;with call bell/phone within reach;with chair alarm set;with family/visitor present Nurse Communication: Mobility status PT Visit Diagnosis: Difficulty in walking, not elsewhere classified (R26.2);Other abnormalities of gait and mobility (R26.89);Muscle weakness (generalized) (M62.81);History of falling (Z91.81)     Time: 2426-8341 PT Time Calculation (min) (ACUTE ONLY): 46 min  Charges:  $Gait Training: 23-37 mins $Therapeutic Exercise: 8-22 mins                    Danielle Dess, PTA 10/18/20, 10:05 AM

## 2020-10-18 NOTE — Progress Notes (Signed)
Physical Therapy Treatment Patient Details Name: Jill Burch MRN: [id number] DOB: [birth year] Today's Date: 10/18/2020   History of Present Illness Jill Burch is a 60yoF who comes to [facility] on 9/8 for elective Lt TKA. She has PMHx significant for anxiety associated with depression.    PT Comments    Pt in chair  needing to use bathroom.  Stood and is able to walk to bathroom but needed some increased time.  Pt in bathroom and RN aware.  Discharge orders in,   Recommendations for follow up therapy are one component of a multi-disciplinary discharge planning process, led by the attending physician.  Recommendations may be updated based on patient status, additional functional criteria and insurance authorization.  Follow Up Recommendations  Home health PT;Supervision/Assistance - 24 hour     Equipment Recommendations  3in1 (PT)    Recommendations for Other Services       Precautions / Restrictions Precautions Precautions: Fall;Knee Precaution Booklet Issued: Yes (comment) Restrictions Weight Bearing Restrictions: Yes LLE Weight Bearing: Weight bearing as tolerated     Mobility  Bed Mobility               General bed mobility comments: in recliner    Transfers Overall transfer level: Needs assistance Equipment used: Rolling walker (2 wheeled) Transfers: Sit to/from Stand Sit to Stand: Min guard            Ambulation/Gait Ambulation/Gait assistance: Min guard Gait Distance (Feet): 30 Feet Assistive device: Rolling walker (2 wheeled) Gait Pattern/deviations: Step-to pattern;Decreased weight shift to right;Decreased step length - left Gait velocity: decreased   General Gait Details: walked to bathroom for commode   Stairs             Wheelchair Mobility    Modified Rankin (Stroke Patients Only)       Balance Overall balance assessment: Needs assistance Sitting-balance support: Feet supported Sitting balance-Leahy Scale: Good     Standing  balance support: Bilateral upper extremity supported;During functional activity Standing balance-Leahy Scale: Fair Standing balance comment: reliant on UE support throughout all standing task. Heavy reliance with dynamic task                            Cognition Arousal/Alertness: Awake/alert Behavior During Therapy: WFL for tasks assessed/performed Overall Cognitive Status: Within Functional Limits for tasks assessed                                        Exercises      General Comments        Pertinent Vitals/Pain Pain Assessment: Faces Faces Pain Scale: Hurts even more Pain Location: operative knee Pain Descriptors / Indicators: Sore Pain Intervention(s): Limited activity within patient's tolerance;Monitored during session;Repositioned    Home Living                      Prior Function            PT Goals (current goals can now be found in the care plan section) Progress towards PT goals: Progressing toward goals    Frequency    BID      PT Plan Current plan remains appropriate    Co-evaluation              AM-PAC PT "6 Clicks" Mobility   Outcome Measure  Help needed turning from  your back to your side while in a flat bed without using bedrails?: A Little Help needed moving from lying on your back to sitting on the side of a flat bed without using bedrails?: A Little Help needed moving to and from a bed to a chair (including a wheelchair)?: A Little Help needed standing up from a chair using your arms (e.g., wheelchair or bedside chair)?: A Little Help needed to walk in hospital room?: A Little Help needed climbing 3-5 steps with a railing? : A Little 6 Click Score: 18    End of Session Equipment Utilized During Treatment: Gait belt Activity Tolerance: Patient tolerated treatment well Patient left: in chair;with call bell/phone within reach;with chair alarm set;with family/visitor present Nurse Communication:  Mobility status PT Visit Diagnosis: Difficulty in walking, not elsewhere classified (R26.2);Other abnormalities of gait and mobility (R26.89);Muscle weakness (generalized) (M62.81);History of falling (Z91.81)     Time: 0154-0202 PT Time Calculation (min) (ACUTE ONLY): 8 min  Charges:  $Gait Training: 8-22 mins                    Danielle Dess, PTA 10/18/20, 2:46 PM

## 2020-10-18 NOTE — Progress Notes (Signed)
Subjective: 4 Days Post-Op Procedure(s) (LRB): TOTAL KNEE ARTHROPLASTY (Left) APPLICATION OF WOUND VAC Patient reports pain mild this AM, plan is to work with PT today before discharge home. Patient is having issues with orthostatic hypotension, appears to have improved during evening session of PT yesterday. Plan is to go Home after hospital stay.  Negative for chest pain and shortness of breath Fever: no Gastrointestinal: negative for nausea and vomiting.  Patient has had a bowel movement.  Objective: Vital signs in last 24 hours: Temp:  [97.8 F (36.6 C)-98.4 F (36.9 C)] 97.8 F (36.6 C) (09/12 0549) Pulse Rate:  [68-73] 68 (09/12 0549) Resp:  [16-18] 16 (09/12 0549) BP: (102-125)/(56-64) 125/64 (09/12 0549) SpO2:  [93 %-97 %] 97 % (09/12 0549)  Intake/Output from previous day:  Intake/Output Summary (Last 24 hours) at 10/18/2020 0742 Last data filed at 10/17/2020 2200 Gross per 24 hour  Intake 2081.77 ml  Output 700 ml  Net 1381.77 ml    Intake/Output this shift: No intake/output data recorded.  Labs: Recent Labs    10/16/20 0450  HGB 11.3*   Recent Labs    10/16/20 0450  WBC 8.8  RBC 3.79*  HCT 35.0*  PLT 234   Recent Labs    10/16/20 0450 10/17/20 0435  NA 141 139  K 4.1 4.1  CL 106 100  CO2 31 33*  BUN 16 11  CREATININE 0.46 0.73  GLUCOSE 96 100*  CALCIUM 8.5* 8.7*   No results for input(s): LABPT, INR in the last 72 hours.   EXAM General - Patient is Alert, Appropriate, and Oriented Extremity - Neurovascular intact Dorsiflexion/Plantar flexion intact Compartment soft Dressing/Incision -Prevena in place, no drainage noted in cannister  Motor Function - intact, moving foot and toes well on exam.    Cardiovascular- Regular rate and rhythm, no murmurs/rubs/gallops Gastrointestinal- soft, nontender, and active bowel sounds   Assessment/Plan: 4 Days Post-Op Procedure(s) (LRB): TOTAL KNEE ARTHROPLASTY (Left) APPLICATION OF WOUND  VAC Active Problems:   Status post total knee replacement using cement, left  Estimated body mass index is 40.79 kg/m as calculated from the following:   Height as of this encounter: 5\' 2"  (1.575 m).   Weight as of this encounter: 101.2 kg. Advance diet Up with therapy  Vitals reviewed this AM.  BP 125/64. Monitor dizziness during PT session. Received IV bolus yesterday. Continue to try to limit narcotics today. Patient has had a BM. No drainage to the Prevena. Plan for possible d/c home this afternoon pending progress with PT.  DVT Prophylaxis - Eliquis, Ted hose, and foot pumps Weight-Bearing as tolerated to left leg  J. , PA-C [facility] Orthopaedic Surgery 10/18/2020, 7:42 AM

## 2020-10-18 NOTE — Telephone Encounter (Signed)
   Telephone encounter was:  Unsuccessful.  10/18/2020 Name: Jill Burch MRN: [id number] DOB: 03/02/1960  Unsuccessful outbound call made today to assist with:  Transportation Needs   Outreach Attempt:  1st Attempt  A HIPAA compliant voice message was left requesting a return call.  Instructed patient to call back at [phone] at their earliest convenience.  [facility] Guide, Embedded Care Coordination [facility]  [place], [place] [address]  Main Phone: [phone]  E-mail: [email or web address]  Website: [email or web address]

## 2020-10-18 NOTE — Plan of Care (Signed)
Problem: Education: Goal: Knowledge of General Education information will improve Description: Including pain rating scale, medication(s)/side effects and non-pharmacologic comfort measures 10/18/2020 1829 by Neomi Laidler Bet, LPN Outcome: Adequate for Discharge 10/18/2020 1829 by Farrah Skoda Bet, LPN Outcome: Progressing   Problem: Health Behavior/Discharge Planning: Goal: Ability to manage health-related needs will improve 10/18/2020 1829 by Jackie Russman Bet, LPN Outcome: Adequate for Discharge 10/18/2020 1829 by Luara Faye Bet, LPN Outcome: Progressing   Problem: Clinical Measurements: Goal: Ability to maintain clinical measurements within normal limits will improve 10/18/2020 1829 by Abisai Coble Bet, LPN Outcome: Adequate for Discharge 10/18/2020 1829 by Griffin Gerrard Bet, LPN Outcome: Progressing Goal: Will remain free from infection 10/18/2020 1829 by Lucylle Foulkes Bet, LPN Outcome: Adequate for Discharge 10/18/2020 1829 by Chasten Blaze Bet, LPN Outcome: Progressing Goal: Diagnostic test results will improve 10/18/2020 1829 by Emillee Talsma Bet, LPN Outcome: Adequate for Discharge 10/18/2020 1829 by Trinna Kunst Bet, LPN Outcome: Progressing Goal: Respiratory complications will improve 10/18/2020 1829 by Felicha Frayne Bet, LPN Outcome: Adequate for Discharge 10/18/2020 1829 by Commodore Bellew Bet, LPN Outcome: Progressing Goal: Cardiovascular complication will be avoided 10/18/2020 1829 by Traves Majchrzak Bet, LPN Outcome: Adequate for Discharge 10/18/2020 1829 by Ashanta Amoroso, Tayvien Kane Bet, LPN Outcome: Progressing   Problem: Activity: Goal: Risk for activity intolerance will decrease 10/18/2020 1829 by Jerine Surles Bet, LPN Outcome: Adequate for Discharge 10/18/2020 1829 by Rhyan Wolters Bet, LPN Outcome: Progressing   Problem: Nutrition: Goal: Adequate nutrition will be maintained 10/18/2020 1829 by Jodine Muchmore Bet, LPN Outcome: Adequate for Discharge 10/18/2020 1829 by Mikhaila Roh Bet, LPN Outcome: Progressing    Problem: Coping: Goal: Level of anxiety will decrease 10/18/2020 1829 by Brandice Busser Bet, LPN Outcome: Adequate for Discharge 10/18/2020 1829 by Lavaeh Bau Bet, LPN Outcome: Progressing   Problem: Elimination: Goal: Will not experience complications related to bowel motility 10/18/2020 1829 by Taiwan Talcott Bet, LPN Outcome: Adequate for Discharge 10/18/2020 1829 by Takelia Urieta Bet, LPN Outcome: Progressing Goal: Will not experience complications related to urinary retention 10/18/2020 1829 by Sophiamarie Nease Bet, LPN Outcome: Adequate for Discharge 10/18/2020 1829 by Krisha Beegle Bet, LPN Outcome: Progressing   Problem: Pain Managment: Goal: General experience of comfort will improve 10/18/2020 1829 by Jemiah Cuadra Bet, LPN Outcome: Adequate for Discharge 10/18/2020 1829 by Caton Popowski Bet, LPN Outcome: Progressing   Problem: Safety: Goal: Ability to remain free from injury will improve 10/18/2020 1829 by Emiley Digiacomo Bet, LPN Outcome: Adequate for Discharge 10/18/2020 1829 by Brinden Kincheloe Bet, LPN Outcome: Progressing   Problem: Skin Integrity: Goal: Risk for impaired skin integrity will decrease 10/18/2020 1829 by Nelly Scriven Bet, LPN Outcome: Adequate for Discharge 10/18/2020 1829 by Shuan Statzer Bet, LPN Outcome: Progressing   Problem: Education: Goal: Knowledge of the prescribed therapeutic regimen will improve 10/18/2020 1829 by Kolsen Choe Bet, LPN Outcome: Adequate for Discharge 10/18/2020 1829 by Natanya Holecek Bet, LPN Outcome: Progressing Goal: Individualized Educational Video(s) 10/18/2020 1829 by Archer Asa, Lum Stillinger Bet, LPN Outcome: Adequate for Discharge 10/18/2020 1829 by Meliana Canner Bet, LPN Outcome: Progressing   Problem: Activity: Goal: Ability to avoid complications of mobility impairment will improve 10/18/2020 1829 by Etoile Looman Bet, LPN Outcome: Adequate for Discharge 10/18/2020 1829 by Jesseka Drinkard Bet, LPN Outcome: Progressing Goal: Range of joint motion will improve 10/18/2020 1829 by  Jordana Dugue Bet, LPN Outcome: Adequate for Discharge 10/18/2020 1829 by Thula Stewart Bet, LPN Outcome: Progressing   Problem: Clinical Measurements: Goal: Postoperative complications will be avoided or minimized 10/18/2020 1829  by Archer Asa, Gurshaan Matsuoka Bet, LPN Outcome: Adequate for Discharge 10/18/2020 1829 by Johneric Mcfadden Bet, LPN Outcome: Progressing   Problem: Pain Management: Goal: Pain level will decrease with appropriate interventions 10/18/2020 1829 by Marty Uy Bet, LPN Outcome: Adequate for Discharge 10/18/2020 1829 by Law Corsino Bet, LPN Outcome: Progressing   Problem: Skin Integrity: Goal: Will show signs of wound healing 10/18/2020 1829 by Archer Asa, Jonet Mathies Bet, LPN Outcome: Adequate for Discharge 10/18/2020 1829 by Hamsa Laurich Bet, LPN Outcome: Progressing   Problem: Acute Rehab PT Goals(only PT should resolve) Goal: Patient Will Transfer Sit To/From Stand Outcome: Adequate for Discharge Goal: Pt Will Ambulate Outcome: Adequate for Discharge Goal: Pt Will Go Up/Down Stairs Outcome: Adequate for Discharge

## 2020-10-18 NOTE — Progress Notes (Signed)
Discharge note: Reviewed discharge instructions with patient.  PT verbalized understanding. IV cath intact upon removal. PT discharged with personal belongings, 3 in 1 DME, polar care, extra honeycomb dressing, personal belongings, and compressions socks.Staff wheeled pt out. Pt transported to home via family vehicle.

## 2020-10-18 NOTE — Discharge Summary (Signed)
Physician Discharge Summary  Patient ID: Jill Burch MRN: [id number] DOB/AGE: 1960/12/04 60 y.o.  Admit date: 10/14/2020 Discharge date: 10/18/2020  Admission Diagnoses:  Status post total knee replacement using cement, left [Z96.652]  Discharge Diagnoses: Patient Active Problem List   Diagnosis Date Noted   Status post total knee replacement using cement, left 10/14/2020   Chronic venous insufficiency 04/27/2017   Lymphedema 04/27/2017   Hyperlipidemia 03/20/2017   Swelling of limb 03/20/2017   Varicose veins of bilateral lower extremities with pain 03/20/2017   Anxiety    Depression     Past Medical History:  Diagnosis Date   Anxiety    Depression    Hyperlipidemia    Osteoarthritis    PONV (postoperative nausea and vomiting)    Pre-diabetes      Transfusion: None.   Consultants (if any):   Discharged Condition: Improved  Hospital Course: Jill Burch is an 60 y.o. female who was admitted 10/14/2020 with a diagnosis of degenerative joint disease of the left knee and went to the operating room on 10/14/2020 and underwent the above named procedures.    Surgeries: Procedure(s): TOTAL KNEE ARTHROPLASTY APPLICATION OF WOUND VAC on 10/14/2020 Patient tolerated the surgery well. Taken to PACU where she was stabilized and then transferred to the orthopedic floor.  Started on Eliquis 2.5mg  twice daily for DVT prophylaxis. Foot pumps applied bilaterally at 80 mm. Heels elevated on bed with rolled towels. No evidence of DVT. Negative Homan. Physical therapy started on day #1 for gait training and transfer. OT started day #1 for ADL and assisted devices.  Patient with some initial difficulty with PT.  Orthostatic hypotenson occurred on POD2 and POD3.  Narcotic pain medication was adjusted and IV bolus was given with significant improvement with afternoon PT session on 10/17/20.  Patient's IV was removed on POD4.  Implants: Left TKA using all-cemented Signature-guided Biomet  Vanguard system with a 70 mm PCR femur, a 75 mm tibial tray with a 12 mm anterior stabilized E-poly insert, and a 37 x 8.6 mm all-poly 3-pegged domed patella.  She was given perioperative antibiotics:  Anti-infectives (From admission, onward)    Start     Dose/Rate Route Frequency Ordered Stop   10/14/20 1448  ceFAZolin (ANCEF) 2-4 GM/100ML-% IVPB       Note to Pharmacy: Marye Round   : cabinet override      10/14/20 1448 10/15/20 0259   10/14/20 1400  ceFAZolin (ANCEF) IVPB 2g/100 mL premix        2 g 200 mL/hr over 30 Minutes Intravenous Every 6 hours 10/14/20 1235 10/15/20 0206   10/14/20 0638  ceFAZolin (ANCEF) 2-4 GM/100ML-% IVPB       Note to Pharmacy: Stefano Gaul  : cabinet override      10/14/20 0638 10/14/20 1645   10/14/20 0600  ceFAZolin (ANCEF) IVPB 2g/100 mL premix        2 g 200 mL/hr over 30 Minutes Intravenous On call to O.R. 10/14/20 0340 10/14/20 3762     .  She was given sequential compression devices, early ambulation, and Eliquis for DVT prophylaxis.  She benefited maximally from the hospital stay and there were no complications.    Recent vital signs:  Vitals:   10/17/20 1937 10/18/20 0549  BP: 106/63 125/64  Pulse: 72 68  Resp: 18 16  Temp: 98.4 F (36.9 C) 97.8 F (36.6 C)  SpO2: 93% 97%    Recent laboratory studies:  Lab Results  Component Value  Date   HGB 11.3 (L) 10/16/2020   HGB 11.6 (L) 10/15/2020   HGB 13.2 10/04/2020   Lab Results  Component Value Date   WBC 8.8 10/16/2020   PLT 234 10/16/2020   No results found for: INR Lab Results  Component Value Date   NA 139 10/17/2020   K 4.1 10/17/2020   CL 100 10/17/2020   CO2 33 (H) 10/17/2020   BUN 11 10/17/2020   CREATININE 0.73 10/17/2020   GLUCOSE 100 (H) 10/17/2020    Discharge Medications:   Allergies as of 10/18/2020       Reactions   Lactose Diarrhea   Bloating, gas    Lactose Intolerance (gi) Diarrhea   Bloating, gas         Medication List     TAKE  these medications    acetaminophen 500 MG tablet Commonly known as: TYLENOL Take 2 tablets (1,000 mg total) by mouth every 6 (six) hours as needed. What changed: how much to take   ADULT GUMMY PO Take 2 capsules by mouth daily.   apixaban 2.5 MG Tabs tablet Commonly known as: ELIQUIS Take 1 tablet (2.5 mg total) by mouth 2 (two) times daily.   Arnicare Gel Apply topically as needed.   Aspercreme Lidocaine 4 % Liqd Generic drug: Lidocaine HCl Apply topically as needed.   diclofenac Sodium 1 % Gel Commonly known as: VOLTAREN Apply 2 g topically 4 (four) times daily.   famotidine 20 MG tablet Commonly known as: PEPCID Take 20 mg by mouth 2 (two) times daily as needed for heartburn.   gabapentin 300 MG capsule Commonly known as: NEURONTIN Take 300 mg by mouth 2 (two) times daily.   ondansetron 4 MG tablet Commonly known as: ZOFRAN Take 1 tablet (4 mg total) by mouth every 6 (six) hours as needed for nausea.   oxyCODONE 5 MG immediate release tablet Commonly known as: Oxy IR/ROXICODONE Take 1-2 tablets (5-10 mg total) by mouth every 4 (four) hours as needed for moderate pain (pain score 4-6).   rosuvastatin 20 MG tablet Commonly known as: Crestor Take 1 tablet (20 mg total) by mouth daily. What changed: when to take this   traMADol 50 MG tablet Commonly known as: ULTRAM Take 1 tablet (50 mg total) by mouth every 6 (six) hours as needed for moderate pain.   venlafaxine XR 150 MG 24 hr capsule Commonly known as: EFFEXOR-XR Take 1 capsule (150 mg total) by mouth daily with breakfast.               Durable Medical Equipment  (From admission, onward)           Start     Ordered   10/14/20 1520  DME Bedside commode  Once       Question:  Patient needs a bedside commode to treat with the following condition  Answer:  Status post total knee replacement using cement, left   10/14/20 1519   10/14/20 1520  DME 3 n 1  Once        10/14/20 1519   10/14/20 1520   DME Walker rolling  Once       Question Answer Comment  Walker: With 5 Inch Wheels   Patient needs a walker to treat with the following condition Status post total knee replacement using cement, left      10/14/20 1519            Diagnostic Studies: DG Knee Left Port  Result Date: 10/14/2020  CLINICAL DATA:  Status post left knee replacement. EXAM: PORTABLE LEFT KNEE - 1-2 VIEW COMPARISON:  Preoperative imaging. FINDINGS: Left knee arthroplasty in expected alignment. No periprosthetic lucency or fracture. There has been patellar resurfacing. Recent postsurgical change includes air and edema in the soft tissues and joint space. Anterior skin staples in place. Wound VAC in place. IMPRESSION: Left knee arthroplasty without immediate postoperative complication. Electronically Signed   By: Narda Rutherford M.D.   On: 10/14/2020 11:14    Disposition: Plan for discharge home this afternoon pending continued progress with PT today.   Follow-up Information     Anson [place], PA-C Follow up in 14 day(s).   Specialty: Physician Assistant Why: Mindi Slicker information: [address] [place] [address] [phone]                Signed: Meriel Pica PA-C 10/18/2020, 7:46 AM

## 2020-10-19 DIAGNOSIS — I890 Lymphedema, not elsewhere classified: Secondary | ICD-10-CM | POA: Diagnosis not present

## 2020-10-19 DIAGNOSIS — Z96652 Presence of left artificial knee joint: Secondary | ICD-10-CM | POA: Diagnosis not present

## 2020-10-19 DIAGNOSIS — I83813 Varicose veins of bilateral lower extremities with pain: Secondary | ICD-10-CM | POA: Diagnosis not present

## 2020-10-19 DIAGNOSIS — E785 Hyperlipidemia, unspecified: Secondary | ICD-10-CM | POA: Diagnosis not present

## 2020-10-19 DIAGNOSIS — Z87891 Personal history of nicotine dependence: Secondary | ICD-10-CM | POA: Diagnosis not present

## 2020-10-19 DIAGNOSIS — Z7901 Long term (current) use of anticoagulants: Secondary | ICD-10-CM | POA: Diagnosis not present

## 2020-10-19 DIAGNOSIS — Z471 Aftercare following joint replacement surgery: Secondary | ICD-10-CM | POA: Diagnosis not present

## 2020-10-20 ENCOUNTER — Telehealth: Payer: Self-pay | Admitting: *Deleted

## 2020-10-20 NOTE — Telephone Encounter (Signed)
Transition Care Management Unsuccessful Follow-up Telephone Call  Date of discharge and from where:  10/18/20  [facility]  Attempts:  1st Attempt   Reason for unsuccessful TCM follow-up call:  Unable to reach patient- female answered the phone and states pt is not at home, she is at her sister's and does not have her phone, pt will need to be outreached another day.  [facility], BSN RN Case Manager [phone]

## 2020-10-21 DIAGNOSIS — E785 Hyperlipidemia, unspecified: Secondary | ICD-10-CM | POA: Diagnosis not present

## 2020-10-21 DIAGNOSIS — Z471 Aftercare following joint replacement surgery: Secondary | ICD-10-CM | POA: Diagnosis not present

## 2020-10-21 DIAGNOSIS — Z87891 Personal history of nicotine dependence: Secondary | ICD-10-CM | POA: Diagnosis not present

## 2020-10-21 DIAGNOSIS — Z96652 Presence of left artificial knee joint: Secondary | ICD-10-CM | POA: Diagnosis not present

## 2020-10-21 DIAGNOSIS — Z7901 Long term (current) use of anticoagulants: Secondary | ICD-10-CM | POA: Diagnosis not present

## 2020-10-21 DIAGNOSIS — I83813 Varicose veins of bilateral lower extremities with pain: Secondary | ICD-10-CM | POA: Diagnosis not present

## 2020-10-21 DIAGNOSIS — I890 Lymphedema, not elsewhere classified: Secondary | ICD-10-CM | POA: Diagnosis not present

## 2020-10-22 ENCOUNTER — Telehealth: Payer: Self-pay

## 2020-10-22 DIAGNOSIS — Z471 Aftercare following joint replacement surgery: Secondary | ICD-10-CM | POA: Diagnosis not present

## 2020-10-22 NOTE — Telephone Encounter (Signed)
   Telephone encounter was:  Unsuccessful.  10/22/2020 Name: Jill Burch MRN: [id number] DOB: 01/29/1961  Unsuccessful outbound call made today to assist with:  Transportation Needs   Outreach Attempt:  2nd Attempt  A HIPAA compliant voice message was left requesting a return call.  Instructed patient to call back at [phone] at their earliest convenience.   [facility] Guide, Embedded Care Coordination [facility]  [place], [place] [address]  Main Phone: [phone]  E-mail: [email or web address]  Website: [email or web address]

## 2020-10-23 DIAGNOSIS — Z96652 Presence of left artificial knee joint: Secondary | ICD-10-CM | POA: Diagnosis not present

## 2020-10-23 DIAGNOSIS — E785 Hyperlipidemia, unspecified: Secondary | ICD-10-CM | POA: Diagnosis not present

## 2020-10-23 DIAGNOSIS — I83813 Varicose veins of bilateral lower extremities with pain: Secondary | ICD-10-CM | POA: Diagnosis not present

## 2020-10-23 DIAGNOSIS — Z471 Aftercare following joint replacement surgery: Secondary | ICD-10-CM | POA: Diagnosis not present

## 2020-10-23 DIAGNOSIS — Z7901 Long term (current) use of anticoagulants: Secondary | ICD-10-CM | POA: Diagnosis not present

## 2020-10-23 DIAGNOSIS — I890 Lymphedema, not elsewhere classified: Secondary | ICD-10-CM | POA: Diagnosis not present

## 2020-10-23 DIAGNOSIS — Z87891 Personal history of nicotine dependence: Secondary | ICD-10-CM | POA: Diagnosis not present

## 2020-10-25 ENCOUNTER — Telehealth: Payer: Self-pay

## 2020-10-25 DIAGNOSIS — I890 Lymphedema, not elsewhere classified: Secondary | ICD-10-CM | POA: Diagnosis not present

## 2020-10-25 DIAGNOSIS — I83813 Varicose veins of bilateral lower extremities with pain: Secondary | ICD-10-CM | POA: Diagnosis not present

## 2020-10-25 DIAGNOSIS — E785 Hyperlipidemia, unspecified: Secondary | ICD-10-CM | POA: Diagnosis not present

## 2020-10-25 DIAGNOSIS — Z87891 Personal history of nicotine dependence: Secondary | ICD-10-CM | POA: Diagnosis not present

## 2020-10-25 DIAGNOSIS — Z96652 Presence of left artificial knee joint: Secondary | ICD-10-CM | POA: Diagnosis not present

## 2020-10-25 DIAGNOSIS — Z7901 Long term (current) use of anticoagulants: Secondary | ICD-10-CM | POA: Diagnosis not present

## 2020-10-25 DIAGNOSIS — Z471 Aftercare following joint replacement surgery: Secondary | ICD-10-CM | POA: Diagnosis not present

## 2020-10-25 NOTE — Telephone Encounter (Signed)
   Telephone encounter was:  Successful.  10/25/2020 Name: NORELL BRISBIN MRN: [id number] DOB: 06-Jul-1960  JENNIFR GAETA is a 60 y.o. year old female who is a primary care patient of Larae Grooms, NP . The community resource team was consulted for assistance with Transportation Needs   Care guide performed the following interventions:  Pt advised she is only looking for Medical Transportation at this time. I will send her Therapist, occupational by mail for [employer] and [employer]. Her next appointment is in Jan. 2023.  Follow Up Plan:  Care guide will follow up with patient by phone over the next few days.  [facility] Guide, Embedded Care Coordination [facility]  [place], [place] [address]  Main Phone: [phone]  E-mail: [email or web address]  Website: [email or web address]

## 2020-10-26 DIAGNOSIS — Z96652 Presence of left artificial knee joint: Secondary | ICD-10-CM | POA: Diagnosis not present

## 2020-10-26 DIAGNOSIS — Z7901 Long term (current) use of anticoagulants: Secondary | ICD-10-CM | POA: Diagnosis not present

## 2020-10-26 DIAGNOSIS — I83813 Varicose veins of bilateral lower extremities with pain: Secondary | ICD-10-CM | POA: Diagnosis not present

## 2020-10-26 DIAGNOSIS — E785 Hyperlipidemia, unspecified: Secondary | ICD-10-CM | POA: Diagnosis not present

## 2020-10-26 DIAGNOSIS — I890 Lymphedema, not elsewhere classified: Secondary | ICD-10-CM | POA: Diagnosis not present

## 2020-10-26 DIAGNOSIS — Z87891 Personal history of nicotine dependence: Secondary | ICD-10-CM | POA: Diagnosis not present

## 2020-10-26 DIAGNOSIS — Z471 Aftercare following joint replacement surgery: Secondary | ICD-10-CM | POA: Diagnosis not present

## 2020-10-28 DIAGNOSIS — Z471 Aftercare following joint replacement surgery: Secondary | ICD-10-CM | POA: Diagnosis not present

## 2020-10-28 DIAGNOSIS — I890 Lymphedema, not elsewhere classified: Secondary | ICD-10-CM | POA: Diagnosis not present

## 2020-10-28 DIAGNOSIS — Z7901 Long term (current) use of anticoagulants: Secondary | ICD-10-CM | POA: Diagnosis not present

## 2020-10-28 DIAGNOSIS — E785 Hyperlipidemia, unspecified: Secondary | ICD-10-CM | POA: Diagnosis not present

## 2020-10-28 DIAGNOSIS — Z96652 Presence of left artificial knee joint: Secondary | ICD-10-CM | POA: Diagnosis not present

## 2020-10-28 DIAGNOSIS — I83813 Varicose veins of bilateral lower extremities with pain: Secondary | ICD-10-CM | POA: Diagnosis not present

## 2020-10-28 DIAGNOSIS — Z87891 Personal history of nicotine dependence: Secondary | ICD-10-CM | POA: Diagnosis not present

## 2020-10-29 DIAGNOSIS — M6281 Muscle weakness (generalized): Secondary | ICD-10-CM | POA: Diagnosis not present

## 2020-10-29 DIAGNOSIS — M25562 Pain in left knee: Secondary | ICD-10-CM | POA: Diagnosis not present

## 2020-10-29 DIAGNOSIS — Z96652 Presence of left artificial knee joint: Secondary | ICD-10-CM | POA: Diagnosis not present

## 2020-10-29 DIAGNOSIS — M25662 Stiffness of left knee, not elsewhere classified: Secondary | ICD-10-CM | POA: Diagnosis not present

## 2020-11-02 DIAGNOSIS — M25662 Stiffness of left knee, not elsewhere classified: Secondary | ICD-10-CM | POA: Diagnosis not present

## 2020-11-02 DIAGNOSIS — Z96652 Presence of left artificial knee joint: Secondary | ICD-10-CM | POA: Diagnosis not present

## 2020-11-02 DIAGNOSIS — M6281 Muscle weakness (generalized): Secondary | ICD-10-CM | POA: Diagnosis not present

## 2020-11-02 DIAGNOSIS — M25562 Pain in left knee: Secondary | ICD-10-CM | POA: Diagnosis not present

## 2020-11-04 DIAGNOSIS — Z96652 Presence of left artificial knee joint: Secondary | ICD-10-CM | POA: Diagnosis not present

## 2020-11-09 DIAGNOSIS — Z96652 Presence of left artificial knee joint: Secondary | ICD-10-CM | POA: Diagnosis not present

## 2020-11-09 DIAGNOSIS — M25562 Pain in left knee: Secondary | ICD-10-CM | POA: Diagnosis not present

## 2020-11-09 DIAGNOSIS — M25662 Stiffness of left knee, not elsewhere classified: Secondary | ICD-10-CM | POA: Diagnosis not present

## 2020-11-09 DIAGNOSIS — M6281 Muscle weakness (generalized): Secondary | ICD-10-CM | POA: Diagnosis not present

## 2020-11-11 DIAGNOSIS — M6281 Muscle weakness (generalized): Secondary | ICD-10-CM | POA: Diagnosis not present

## 2020-11-11 DIAGNOSIS — M25562 Pain in left knee: Secondary | ICD-10-CM | POA: Diagnosis not present

## 2020-11-11 DIAGNOSIS — M25662 Stiffness of left knee, not elsewhere classified: Secondary | ICD-10-CM | POA: Diagnosis not present

## 2020-11-11 DIAGNOSIS — Z96652 Presence of left artificial knee joint: Secondary | ICD-10-CM | POA: Diagnosis not present

## 2020-11-19 DIAGNOSIS — R7303 Prediabetes: Secondary | ICD-10-CM | POA: Diagnosis not present

## 2020-11-22 ENCOUNTER — Telehealth: Payer: Self-pay

## 2020-11-22 NOTE — Telephone Encounter (Signed)
   Telephone encounter was:  Unsuccessful.  11/22/2020 Name: Jill Burch MRN: [id number] DOB: 12-29-1960  Unsuccessful outbound call made today to assist with:  Transportation Needs   Outreach Attempt:  1st Attempt  A HIPAA compliant voice message was left requesting a return call.  Instructed patient to call back at [phone] at their earliest convenience   [facility] Guide, Embedded Care Coordination [facility]  [place], [place] [address]  Main Phone: [phone]  E-mail: [email or web address]  Website: [email or web address]

## 2020-11-24 ENCOUNTER — Telehealth: Payer: Self-pay

## 2020-11-24 DIAGNOSIS — M25662 Stiffness of left knee, not elsewhere classified: Secondary | ICD-10-CM | POA: Diagnosis not present

## 2020-11-24 DIAGNOSIS — M25562 Pain in left knee: Secondary | ICD-10-CM | POA: Diagnosis not present

## 2020-11-24 DIAGNOSIS — M6281 Muscle weakness (generalized): Secondary | ICD-10-CM | POA: Diagnosis not present

## 2020-11-24 DIAGNOSIS — Z96652 Presence of left artificial knee joint: Secondary | ICD-10-CM | POA: Diagnosis not present

## 2020-11-24 NOTE — Telephone Encounter (Signed)
   Telephone encounter was:  Unsuccessful.  11/24/2020 Name: Jill Burch MRN: [id number] DOB: May 29, 1960  Unsuccessful outbound call made today to assist with:  Transportation Needs   Outreach Attempt:  2nd Attempt  A HIPAA compliant voice message was left requesting a return call.  Instructed patient to call back at [phone] at their earliest convenience.  [facility] Guide, Embedded Care Coordination [facility]  [place], [place] [address]  Main Phone: [phone]  E-mail: [email or web address]  Website: [email or web address]

## 2020-11-26 ENCOUNTER — Telehealth: Payer: Self-pay

## 2020-11-26 DIAGNOSIS — E785 Hyperlipidemia, unspecified: Secondary | ICD-10-CM | POA: Diagnosis not present

## 2020-11-26 DIAGNOSIS — Z1389 Encounter for screening for other disorder: Secondary | ICD-10-CM | POA: Diagnosis not present

## 2020-11-26 DIAGNOSIS — Z0000 Encounter for general adult medical examination without abnormal findings: Secondary | ICD-10-CM | POA: Diagnosis not present

## 2020-11-26 DIAGNOSIS — M25562 Pain in left knee: Secondary | ICD-10-CM | POA: Diagnosis not present

## 2020-11-26 DIAGNOSIS — M1712 Unilateral primary osteoarthritis, left knee: Secondary | ICD-10-CM | POA: Diagnosis not present

## 2020-11-26 DIAGNOSIS — Z96652 Presence of left artificial knee joint: Secondary | ICD-10-CM | POA: Diagnosis not present

## 2020-11-26 DIAGNOSIS — R7303 Prediabetes: Secondary | ICD-10-CM | POA: Diagnosis not present

## 2020-11-26 DIAGNOSIS — M25662 Stiffness of left knee, not elsewhere classified: Secondary | ICD-10-CM | POA: Diagnosis not present

## 2020-11-26 DIAGNOSIS — F32A Depression, unspecified: Secondary | ICD-10-CM | POA: Diagnosis not present

## 2020-11-26 DIAGNOSIS — M6281 Muscle weakness (generalized): Secondary | ICD-10-CM | POA: Diagnosis not present

## 2020-11-26 NOTE — Telephone Encounter (Signed)
   Telephone encounter was:  Successful.  11/26/2020 Name: Jill Burch MRN: [id number] DOB: [birth year]  Jill Burch is a 60 y.o. year old female who is a primary care patient of Larae Grooms, NP . The community resource team was consulted for assistance with Transportation Needs   Care guide performed the following interventions:  Patient advised she received information in mail. She stated that she has been dealing with other matters and at this time does not have any further questions or concerns. She did however request me to re-send the information for [employer] and [employer] as she may have misplaced them after reviewing them. Information has been sent out once more. Closing request as patient stated she will take care of the applications on her own. She advised will call me on my direct line if she needs assistance .  Follow Up Plan:  No further follow up planned at this time. The patient has been provided with needed resources. [facility] Guide, Embedded Care Coordination [facility]  [place], [place] [address]  Main Phone: [phone]  E-mail: [email or web address]  Website: [email or web address]

## 2020-12-21 DIAGNOSIS — M5412 Radiculopathy, cervical region: Secondary | ICD-10-CM | POA: Diagnosis not present

## 2020-12-21 DIAGNOSIS — R29898 Other symptoms and signs involving the musculoskeletal system: Secondary | ICD-10-CM | POA: Diagnosis not present

## 2020-12-22 ENCOUNTER — Other Ambulatory Visit (HOSPITAL_COMMUNITY): Payer: Self-pay | Admitting: Neurosurgery

## 2020-12-22 ENCOUNTER — Other Ambulatory Visit: Payer: Self-pay | Admitting: Neurosurgery

## 2020-12-22 DIAGNOSIS — R29898 Other symptoms and signs involving the musculoskeletal system: Secondary | ICD-10-CM

## 2020-12-22 DIAGNOSIS — M5412 Radiculopathy, cervical region: Secondary | ICD-10-CM

## 2021-01-10 DIAGNOSIS — J069 Acute upper respiratory infection, unspecified: Secondary | ICD-10-CM | POA: Diagnosis not present

## 2021-01-10 DIAGNOSIS — Z20822 Contact with and (suspected) exposure to covid-19: Secondary | ICD-10-CM | POA: Diagnosis not present

## 2021-01-10 DIAGNOSIS — R059 Cough, unspecified: Secondary | ICD-10-CM | POA: Diagnosis not present

## 2021-01-13 ENCOUNTER — Ambulatory Visit
Admission: RE | Admit: 2021-01-13 | Discharge: 2021-01-13 | Disposition: A | Discharge status: Home / Self Care | Payer: Medicare Other | Source: Ambulatory Visit | Attending: Neurosurgery | Admitting: Neurosurgery

## 2021-01-13 ENCOUNTER — Other Ambulatory Visit: Payer: Self-pay

## 2021-01-13 ENCOUNTER — Ambulatory Visit: Admission: RE | Admit: 2021-01-13 | Payer: Medicare Other | Source: Ambulatory Visit

## 2021-01-13 DIAGNOSIS — M2578 Osteophyte, vertebrae: Secondary | ICD-10-CM | POA: Diagnosis not present

## 2021-01-13 DIAGNOSIS — M5412 Radiculopathy, cervical region: Secondary | ICD-10-CM

## 2021-01-13 DIAGNOSIS — R29898 Other symptoms and signs involving the musculoskeletal system: Secondary | ICD-10-CM | POA: Diagnosis not present

## 2021-01-13 DIAGNOSIS — M47812 Spondylosis without myelopathy or radiculopathy, cervical region: Secondary | ICD-10-CM | POA: Diagnosis not present

## 2021-02-03 ENCOUNTER — Other Ambulatory Visit: Payer: Self-pay | Admitting: Nurse Practitioner

## 2021-02-03 MED ORDER — VENLAFAXINE HCL ER 150 MG PO CP24: 150.0000 mg | ORAL_CAPSULE | Freq: Every day | ORAL | 0 refills | Status: DC

## 2021-02-03 MED ORDER — ROSUVASTATIN CALCIUM 20 MG PO TABS: 20.0000 mg | ORAL_TABLET | Freq: Every day | ORAL | 1 refills | Status: DC

## 2021-02-03 NOTE — Telephone Encounter (Signed)
Requested Prescriptions  Pending Prescriptions Disp Refills   rosuvastatin (CRESTOR) 20 MG tablet 90 tablet 1    Sig: Take 1 tablet (20 mg total) by mouth daily.     Cardiovascular:  Antilipid - Statins Passed - 02/03/2021  4:26 PM      Passed - Total Cholesterol in normal range and within 360 days    Cholesterol, Total  Date Value Ref Range Status  10/13/2020 164 100 - 199 mg/dL Final         Passed - LDL in normal range and within 360 days    LDL Chol Calc (NIH)  Date Value Ref Range Status  10/13/2020 95 0 - 99 mg/dL Final         Passed - HDL in normal range and within 360 days    HDL  Date Value Ref Range Status  10/13/2020 48 >39 mg/dL Final         Passed - Triglycerides in normal range and within 360 days    Triglycerides  Date Value Ref Range Status  10/13/2020 114 0 - 149 mg/dL Final         Passed - Patient is not pregnant      Passed - Valid encounter within last 12 months    Recent Outpatient Visits          3 months ago Anxiety   [facility] Larae Grooms, NP   5 months ago Annual physical exam   [facility] Larae Grooms, NP   2 years ago Anxiety   [facility] Roosvelt Maser [place], [place]   3 years ago Acute non-recurrent maxillary sinusitis   [facility] Gabriel Cirri, NP   3 years ago Anxiety   [facility] [place], [place], [place]      Future Appointments            In 1 week Larae Grooms, NP [facility], [facility]            venlafaxine XR (EFFEXOR-XR) 150 MG 24 hr capsule 90 capsule 0    Sig: Take 1 capsule (150 mg total) by mouth daily with breakfast.     Psychiatry: Antidepressants - SNRI - desvenlafaxine & venlafaxine Passed - 02/03/2021  4:26 PM      Passed - LDL in normal range and within 360 days    LDL Chol Calc (NIH)  Date Value Ref Range Status  10/13/2020 95 0 - 99 mg/dL Final         Passed - Total Cholesterol in normal range and  within 360 days    Cholesterol, Total  Date Value Ref Range Status  10/13/2020 164 100 - 199 mg/dL Final         Passed - Triglycerides in normal range and within 360 days    Triglycerides  Date Value Ref Range Status  10/13/2020 114 0 - 149 mg/dL Final         Passed - Completed PHQ-2 or PHQ-9 in the last 360 days      Passed - Last BP in normal range    BP Readings from Last 1 Encounters:  10/18/20 113/65         Passed - Valid encounter within last 6 months    Recent Outpatient Visits          3 months ago Anxiety   [facility] Larae Grooms, NP   5 months ago Annual physical exam   [facility] [place],  Clydie Braun, NP   2 years ago Anxiety   [facility] Roosvelt Maser [place], [place]   3 years ago Acute non-recurrent maxillary sinusitis   [facility] Gabriel Cirri, NP   3 years ago Anxiety   [facility] Particia Nearing, [place]      Future Appointments            In 1 week Larae Grooms, NP [facility], [facility]

## 2021-02-03 NOTE — Telephone Encounter (Signed)
Medication Refill - Medication: rosuvastatin (CRESTOR) 20 MG tablet venlafaxine XR (EFFEXOR-XR) 150 MG 24 hr capsule 90 day Has the patient contacted their pharmacy? Yes.   Pt will no longer be using [facility] and has switched to [facility] (with phone number or street name): [facility] - [place], [place] - [address] Has the patient been seen for an appointment in the last year OR does the patient have an upcoming appointment? Yes.    Agent: Please be advised that RX refills may take up to 3 business days. We ask that you follow-up with your pharmacy.

## 2021-02-14 ENCOUNTER — Encounter: Payer: Self-pay | Admitting: Nurse Practitioner

## 2021-02-14 ENCOUNTER — Other Ambulatory Visit: Payer: Self-pay

## 2021-02-14 ENCOUNTER — Ambulatory Visit (INDEPENDENT_AMBULATORY_CARE_PROVIDER_SITE_OTHER): Payer: Medicare HMO | Admitting: Nurse Practitioner

## 2021-02-14 VITALS — BP 115/70 | HR 67 | Temp 98.4°F | Wt 199.4 lb

## 2021-02-14 DIAGNOSIS — E785 Hyperlipidemia, unspecified: Secondary | ICD-10-CM | POA: Diagnosis not present

## 2021-02-14 DIAGNOSIS — F419 Anxiety disorder, unspecified: Secondary | ICD-10-CM

## 2021-02-14 DIAGNOSIS — R7303 Prediabetes: Secondary | ICD-10-CM | POA: Insufficient documentation

## 2021-02-14 DIAGNOSIS — F32A Depression, unspecified: Secondary | ICD-10-CM | POA: Diagnosis not present

## 2021-02-14 DIAGNOSIS — Z136 Encounter for screening for cardiovascular disorders: Secondary | ICD-10-CM | POA: Diagnosis not present

## 2021-02-14 MED ORDER — VENLAFAXINE HCL ER 150 MG PO CP24: 150.0000 mg | ORAL_CAPSULE | Freq: Every day | ORAL | 1 refills | Status: DC

## 2021-02-14 MED ORDER — ROSUVASTATIN CALCIUM 40 MG PO TABS: 40.0000 mg | ORAL_TABLET | Freq: Every day | ORAL | 1 refills | Status: DC

## 2021-02-14 NOTE — Assessment & Plan Note (Signed)
Chronic.  Controlled.  Continue with current medication regimen on Crestor 40mg  daily.  Refills sent today.  Labs ordered today.  Return to clinic in 6 months for reevaluation.  Call sooner if concerns arise.

## 2021-02-14 NOTE — Assessment & Plan Note (Signed)
Chronic.  Controlled.  Continue with current medication regimen on Effexor 150mg daily.  Refill sent today.  Labs ordered today.  Return to clinic in 6 months for reevaluation.  Call sooner if concerns arise.   

## 2021-02-14 NOTE — Progress Notes (Deleted)
There were no vitals taken for this visit.   Subjective:    Patient ID: Jill Burch, female    DOB: 11-24-1960, 61 y.o.   MRN: [id number]  HPI: Jill Burch is a 61 y.o. female presenting on 02/14/2021 for comprehensive medical examination. Current medical complaints include:{Blank single:19197::"none","***"}  She currently lives with: Menopausal Symptoms: {Blank single:19197::"yes","no"}  Depression Screen done today and results listed below:  Depression screen [facility] 2/9 10/13/2020 08/25/2020 03/04/2018 02/26/2017 11/03/2015  Decreased Interest 1 1 3 1  0  Down, Depressed, Hopeless 0 0 3 1 0  PHQ - 2 Score 1 1 6 2  0  Altered sleeping 3 3 3  0 -  Tired, decreased energy 3 3 3 3  -  Change in appetite 3 2 3 3  -  Feeling bad or failure about yourself  0 0 0 0 -  Trouble concentrating 3 2 1 2  -  Moving slowly or fidgety/restless 0 0 3 0 -  Suicidal thoughts 0 0 0 0 -  PHQ-9 Score 13 11 19 10  -  Difficult doing work/chores - Not difficult at all Somewhat difficult - -    The patient {has/does not [id number] a history of falls. I {did/did not:19850} complete a risk assessment for falls. A plan of care for falls {was/was not:19852} documented.   Past Medical History:  Past Medical History:  Diagnosis Date   Anxiety    Depression    Hyperlipidemia    Osteoarthritis    PONV (postoperative nausea and vomiting)    Pre-diabetes     Surgical History:  Past Surgical History:  Procedure Laterality Date   APPLICATION OF WOUND VAC  10/14/2020   Procedure: APPLICATION OF WOUND VAC;  Surgeon: Corky Mull, MD;  Location: [facility] ORS;  Service: Orthopedics;;   TOTAL KNEE ARTHROPLASTY Left 10/14/2020   Procedure: TOTAL KNEE ARTHROPLASTY;  Surgeon: Corky Mull, MD;  Location: [facility] ORS;  Service: Orthopedics;  Laterality: Left;   TUBAL LIGATION  1995    Medications:  Current Outpatient Medications on File Prior to Visit  Medication Sig   acetaminophen (TYLENOL) 500 MG tablet Take 2 tablets (1,000  mg total) by mouth every 6 (six) hours as needed.   apixaban (ELIQUIS) 2.5 MG TABS tablet Take 1 tablet (2.5 mg total) by mouth 2 (two) times daily.   diclofenac Sodium (VOLTAREN) 1 % GEL Apply 2 g topically 4 (four) times daily.   famotidine (PEPCID) 20 MG tablet Take 20 mg by mouth 2 (two) times daily as needed for heartburn.   gabapentin (NEURONTIN) 300 MG capsule Take 300 mg by mouth 2 (two) times daily.   Homeopathic Products (ARNICARE) GEL Apply topically as needed.   Lidocaine HCl (ASPERCREME LIDOCAINE) 4 % LIQD Apply topically as needed.   Multiple Vitamins-Minerals (ADULT GUMMY PO) Take 2 capsules by mouth daily.   ondansetron (ZOFRAN) 4 MG tablet Take 1 tablet (4 mg total) by mouth every 6 (six) hours as needed for nausea.   oxyCODONE (OXY IR/ROXICODONE) 5 MG immediate release tablet Take 1-2 tablets (5-10 mg total) by mouth every 4 (four) hours as needed for moderate pain (pain score 4-6).   rosuvastatin (CRESTOR) 20 MG tablet Take 1 tablet (20 mg total) by mouth daily.   traMADol (ULTRAM) 50 MG tablet Take 1 tablet (50 mg total) by mouth every 6 (six) hours as needed for moderate pain.   venlafaxine XR (EFFEXOR-XR) 150 MG 24 hr capsule Take 1 capsule (150 mg total) by mouth daily with  breakfast.   No current facility-administered medications on file prior to visit.    Allergies:  Allergies  Allergen Reactions   Lactose Diarrhea    Bloating, gas    Lactose Intolerance (Gi) Diarrhea    Bloating, gas     Social History:  Social History   Socioeconomic History   Marital status: Married    Spouse name: Sonia Side   Number of children: 1   Years of education: Not on file   Highest education level: Not on file  Occupational History   Not on file  Tobacco Use   Smoking status: Former    Types: Cigarettes    Quit date: 02/06/1990    Years since quitting: 31.0   Smokeless tobacco: Never  Vaping Use   Vaping Use: Never used  Substance and Sexual Activity   Alcohol use: No     Alcohol/week: 0.0 standard drinks   Drug use: No   Sexual activity: Not on file  Other Topics Concern   Not on file  Social History Narrative   Live at home with husband.   Social Determinants of Health   Financial Resource Strain: Not on file  Food Insecurity: Not on file  Transportation Needs: Not on file  Physical Activity: Not on file  Stress: Not on file  Social Connections: Not on file  Intimate Partner Violence: Not on file   Social History   Tobacco Use  Smoking Status Former   Types: Cigarettes   Quit date: 02/06/1990   Years since quitting: 31.0  Smokeless Tobacco Never   Social History   Substance and Sexual Activity  Alcohol Use No   Alcohol/week: 0.0 standard drinks    Family History:  Family History  Problem Relation Age of Onset   Depression Mother    Arthritis Mother    Heart disease Mother    Heart attack Mother    Cancer Father        bladder   Diabetes Father    Depression Father    Hypertension Father    Diabetes Brother    COPD Neg Hx    Stroke Neg Hx     Past medical history, surgical history, medications, allergies, family history and social history reviewed with patient today and changes made to appropriate areas of the chart.   ROS All other ROS negative except what is listed above and in the HPI.      Objective:    There were no vitals taken for this visit.  Wt Readings from Last 3 Encounters:  10/14/20 223 lb (101.2 kg)  10/13/20 221 lb 6.4 oz (100.4 kg)  10/04/20 223 lb (101.2 kg)    Physical Exam  Results for orders placed or performed during the hospital encounter of [id number]  Basic metabolic panel  Result Value Ref Range   Sodium 138 135 - 145 mmol/L   Potassium 3.9 3.5 - 5.1 mmol/L   Chloride 104 98 - 111 mmol/L   CO2 28 22 - 32 mmol/L   Glucose, Bld 130 (H) 70 - 99 mg/dL   BUN 14 8 - 23 mg/dL   Creatinine, Ser 0.76 0.44 - 1.00 mg/dL   Calcium 8.3 (L) 8.9 - 10.3 mg/dL   GFR, Estimated >60 >60 mL/min   Anion  gap 6 5 - 15  CBC  Result Value Ref Range   WBC 11.8 (H) 4.0 - 10.5 K/uL   RBC 3.86 (L) 3.87 - 5.11 MIL/uL   Hemoglobin 11.6 (L) 12.0 - 15.0 g/dL  HCT 35.2 (L) 36.0 - 46.0 %   MCV 91.2 80.0 - 100.0 fL   MCH 30.1 26.0 - 34.0 pg   MCHC 33.0 30.0 - 36.0 g/dL   RDW 12.8 11.5 - 15.5 %   Platelets 237 150 - 400 K/uL   nRBC 0.0 0.0 - 0.2 %  Basic metabolic panel  Result Value Ref Range   Sodium 141 135 - 145 mmol/L   Potassium 4.1 3.5 - 5.1 mmol/L   Chloride 106 98 - 111 mmol/L   CO2 31 22 - 32 mmol/L   Glucose, Bld 96 70 - 99 mg/dL   BUN 16 8 - 23 mg/dL   Creatinine, Ser 0.46 0.44 - 1.00 mg/dL   Calcium 8.5 (L) 8.9 - 10.3 mg/dL   GFR, Estimated >60 >60 mL/min   Anion gap 4 (L) 5 - 15  CBC  Result Value Ref Range   WBC 8.8 4.0 - 10.5 K/uL   RBC 3.79 (L) 3.87 - 5.11 MIL/uL   Hemoglobin 11.3 (L) 12.0 - 15.0 g/dL   HCT 35.0 (L) 36.0 - 46.0 %   MCV 92.3 80.0 - 100.0 fL   MCH 29.8 26.0 - 34.0 pg   MCHC 32.3 30.0 - 36.0 g/dL   RDW 13.1 11.5 - 15.5 %   Platelets 234 150 - 400 K/uL   nRBC 0.0 0.0 - 0.2 %  Glucose, capillary  Result Value Ref Range   Glucose-Capillary 85 70 - 99 mg/dL  Basic metabolic panel  Result Value Ref Range   Sodium 139 135 - 145 mmol/L   Potassium 4.1 3.5 - 5.1 mmol/L   Chloride 100 98 - 111 mmol/L   CO2 33 (H) 22 - 32 mmol/L   Glucose, Bld 100 (H) 70 - 99 mg/dL   BUN 11 8 - 23 mg/dL   Creatinine, Ser 0.73 0.44 - 1.00 mg/dL   Calcium 8.7 (L) 8.9 - 10.3 mg/dL   GFR, Estimated >60 >60 mL/min   Anion gap 6 5 - 15  ABO/Rh  Result Value Ref Range   ABO/RH(D)      B POS Performed at [facility], [address], [place], [place] [address]       Assessment & Plan:   Problem List Items Addressed This Visit       Other   Anxiety - Primary   Depression   Hyperlipidemia     Follow up plan: No follow-ups on file.   LABORATORY TESTING:  - Pap smear: {Blank [id number] done","not applicable","up to date","done  elsewhere"}  IMMUNIZATIONS:   - Tdap: Tetanus vaccination status reviewed: {tetanus status:315746}. - Influenza: {Blank single:19197::"Up to date","Administered today","Postponed to flu season","Refused","Given elsewhere"} - Pneumovax: {Blank single:19197::"Up to date","Administered today","Not applicable","Refused","Given elsewhere"} - Prevnar: {Blank single:19197::"Up to date","Administered today","Not applicable","Refused","Given elsewhere"} - COVID: {Blank single:19197::"Up to date","Administered today","Not applicable","Refused","Given elsewhere"} - HPV: {Blank single:19197::"Up to date","Administered today","Not applicable","Refused","Given elsewhere"} - Shingrix vaccine: {Blank single:19197::"Up to date","Administered today","Not applicable","Refused","Given elsewhere"}  SCREENING: -Mammogram: {Blank single:19197::"Up to date","Ordered today","Not applicable","Refused","Done elsewhere"}  - Colonoscopy: {Blank single:19197::"Up to date","Ordered today","Not applicable","Refused","Done elsewhere"}  - Bone Density: {Blank single:19197::"Up to date","Ordered today","Not applicable","Refused","Done elsewhere"}  -Hearing Test: {Blank single:19197::"Up to date","Ordered today","Not applicable","Refused","Done elsewhere"}  -Spirometry: {Blank single:19197::"Up to date","Ordered today","Not applicable","Refused","Done elsewhere"}   PATIENT COUNSELING:   Advised to take 1 mg of folate supplement per day if capable of pregnancy.   Sexuality: Discussed sexually transmitted diseases, partner selection, use of condoms, avoidance of unintended pregnancy  and contraceptive alternatives.   Advised to  avoid cigarette smoking.  I discussed with the patient that most people either abstain from alcohol or drink within safe limits (<=14/week and <=4 drinks/occasion for males, <=7/weeks and <= 3 drinks/occasion for females) and that the risk for alcohol disorders and other health effects rises  proportionally with the number of drinks per week and how often a drinker exceeds daily limits.  Discussed cessation/primary prevention of drug use and availability of treatment for abuse.   Diet: Encouraged to adjust caloric intake to maintain  or achieve ideal body weight, to reduce intake of dietary saturated fat and total fat, to limit sodium intake by avoiding high sodium foods and not adding table salt, and to maintain adequate dietary potassium and calcium preferably from fresh fruits, vegetables, and low-fat dairy products.    stressed the importance of regular exercise  Injury prevention: Discussed safety belts, safety helmets, smoke detector, smoking near bedding or upholstery.   Dental health: Discussed importance of regular tooth brushing, flossing, and dental visits.    NEXT PREVENTATIVE PHYSICAL DUE IN 1 YEAR. No follow-ups on file.

## 2021-02-14 NOTE — Progress Notes (Signed)
BP 115/70    Pulse 67    Temp 98.4 F (36.9 C) (Oral)    Wt 199 lb 6.4 oz (90.4 kg)    SpO2 97%    BMI 36.47 kg/m    Subjective:    Patient ID: Jill Burch, female    DOB: [birth year], 61 y.o.   MRN: [id number]  HPI: Jill Burch is a 61 y.o. female  Chief Complaint  Patient presents with   Depression   DEPRESSION/ANXIETY Patient states the Effexor is working well for her.  She feels like this is a good dose for her.  Denies concerns at visit today.   [place] Office Visit from 02/14/2021 in [facility]  PHQ-9 Total Score 5      GAD 7 : Generalized Anxiety Score 02/14/2021 02/26/2017  Nervous, Anxious, on Edge 2 3  Control/stop worrying 2 2  Worry too much - different things 1 1  Trouble relaxing 1 2  Restless 0 0  Easily annoyed or irritable 2 2  Afraid - awful might happen 1 0  Total GAD 7 Score 9 10  Anxiety Difficulty Not difficult at all Not difficult at all     HYPERLIPIDEMIA Hyperlipidemia status: excellent compliance Satisfied with current treatment?  no Side effects:  no Medication compliance: excellent compliance Past cholesterol meds: rosuvastatin (crestor) and lovaza Supplements: none Aspirin:  no The 10-year ASCVD risk score (Arnett DK, et al., 2019) is: 3.1%   Values used to calculate the score:     Age: 1 years     Sex: Female     Is Non-Hispanic African American: No     Diabetic: No     Tobacco smoker: No     Systolic Blood Pressure: 573 mmHg     Is BP treated: No     HDL Cholesterol: 48 mg/dL     Total Cholesterol: 164 mg/dL Chest pain:  no Coronary artery disease:  no Family history CAD:  no Family history early CAD:  no  Patient states she is having a lot of ringing in her ears secondary to a pinched nerve in her neck.  She would like to see a new ENT.  Relevant past medical, surgical, family and social history reviewed and updated as indicated. Interim medical history since our last visit reviewed. Allergies and  medications reviewed and updated.  Review of Systems  Eyes:  Negative for visual disturbance.  Respiratory:  Negative for cough, chest tightness and shortness of breath.   Cardiovascular:  Negative for chest pain, palpitations and leg swelling.  Neurological:  Negative for dizziness and headaches.  Psychiatric/Behavioral:  Negative for dysphoric mood and suicidal ideas. The patient is nervous/anxious.    Per HPI unless specifically indicated above     Objective:    BP 115/70    Pulse 67    Temp 98.4 F (36.9 C) (Oral)    Wt 199 lb 6.4 oz (90.4 kg)    SpO2 97%    BMI 36.47 kg/m   Wt Readings from Last 3 Encounters:  02/14/21 199 lb 6.4 oz (90.4 kg)  10/14/20 223 lb (101.2 kg)  10/13/20 221 lb 6.4 oz (100.4 kg)    Physical Exam Vitals and nursing note reviewed.  Constitutional:      General: She is not in acute distress.    Appearance: Normal appearance. She is obese. She is not ill-appearing, toxic-appearing or diaphoretic.  HENT:     Head: Normocephalic.     Right  Ear: External ear normal.     Left Ear: External ear normal.     Nose: Nose normal.     Mouth/Throat:     Mouth: Mucous membranes are moist.     Pharynx: Oropharynx is clear.  Eyes:     General:        Right eye: No discharge.        Left eye: No discharge.     Extraocular Movements: Extraocular movements intact.     Conjunctiva/sclera: Conjunctivae normal.     Pupils: Pupils are equal, round, and reactive to light.  Cardiovascular:     Rate and Rhythm: Normal rate and regular rhythm.     Heart sounds: No murmur heard. Pulmonary:     Effort: Pulmonary effort is normal. No respiratory distress.     Breath sounds: Normal breath sounds. No wheezing or rales.  Musculoskeletal:     Cervical back: Normal range of motion and neck supple.  Skin:    General: Skin is warm and dry.     Capillary Refill: Capillary refill takes less than 2 seconds.  Neurological:     General: No focal deficit present.     Mental  Status: She is alert and oriented to person, place, and time. Mental status is at baseline.  Psychiatric:        Mood and Affect: Mood normal.        Behavior: Behavior normal.        Thought Content: Thought content normal.        Judgment: Judgment normal.    Results for orders placed or performed during the hospital encounter of 73/71/06  Basic metabolic panel  Result Value Ref Range   Sodium 138 135 - 145 mmol/L   Potassium 3.9 3.5 - 5.1 mmol/L   Chloride 104 98 - 111 mmol/L   CO2 28 22 - 32 mmol/L   Glucose, Bld 130 (H) 70 - 99 mg/dL   BUN 14 8 - 23 mg/dL   Creatinine, Ser 0.76 0.44 - 1.00 mg/dL   Calcium 8.3 (L) 8.9 - 10.3 mg/dL   GFR, Estimated >60 >60 mL/min   Anion gap 6 5 - 15  CBC  Result Value Ref Range   WBC 11.8 (H) 4.0 - 10.5 K/uL   RBC 3.86 (L) 3.87 - 5.11 MIL/uL   Hemoglobin 11.6 (L) 12.0 - 15.0 g/dL   HCT 35.2 (L) 36.0 - 46.0 %   MCV 91.2 80.0 - 100.0 fL   MCH 30.1 26.0 - 34.0 pg   MCHC 33.0 30.0 - 36.0 g/dL   RDW 12.8 11.5 - 15.5 %   Platelets 237 150 - 400 K/uL   nRBC 0.0 0.0 - 0.2 %  Basic metabolic panel  Result Value Ref Range   Sodium 141 135 - 145 mmol/L   Potassium 4.1 3.5 - 5.1 mmol/L   Chloride 106 98 - 111 mmol/L   CO2 31 22 - 32 mmol/L   Glucose, Bld 96 70 - 99 mg/dL   BUN 16 8 - 23 mg/dL   Creatinine, Ser 0.46 0.44 - 1.00 mg/dL   Calcium 8.5 (L) 8.9 - 10.3 mg/dL   GFR, Estimated >60 >60 mL/min   Anion gap 4 (L) 5 - 15  CBC  Result Value Ref Range   WBC 8.8 4.0 - 10.5 K/uL   RBC 3.79 (L) 3.87 - 5.11 MIL/uL   Hemoglobin 11.3 (L) 12.0 - 15.0 g/dL   HCT 35.0 (L) 36.0 - 46.0 %  MCV 92.3 80.0 - 100.0 fL   MCH 29.8 26.0 - 34.0 pg   MCHC 32.3 30.0 - 36.0 g/dL   RDW 13.1 11.5 - 15.5 %   Platelets 234 150 - 400 K/uL   nRBC 0.0 0.0 - 0.2 %  Glucose, capillary  Result Value Ref Range   Glucose-Capillary 85 70 - 99 mg/dL  Basic metabolic panel  Result Value Ref Range   Sodium 139 135 - 145 mmol/L   Potassium 4.1 3.5 - 5.1 mmol/L    Chloride 100 98 - 111 mmol/L   CO2 33 (H) 22 - 32 mmol/L   Glucose, Bld 100 (H) 70 - 99 mg/dL   BUN 11 8 - 23 mg/dL   Creatinine, Ser 0.73 0.44 - 1.00 mg/dL   Calcium 8.7 (L) 8.9 - 10.3 mg/dL   GFR, Estimated >60 >60 mL/min   Anion gap 6 5 - 15  ABO/Rh  Result Value Ref Range   ABO/RH(D)      B POS Performed at [facility], [address] [place], [place] [address]       Assessment & Plan:   Problem List Items Addressed This Visit       Other   Anxiety - Primary    Chronic.  Controlled.  Continue with current medication regimen on Effexor 173m daily.  Refill sent today.  Labs ordered today.  Return to clinic in 6 months for reevaluation.  Call sooner if concerns arise.        Relevant Medications   venlafaxine XR (EFFEXOR-XR) 150 MG 24 hr capsule   Other Relevant Orders   Comp Met (CMET)   Depression    Chronic.  Controlled.  Continue with current medication regimen on Effexor 151mdaily.  Refill sent today.  Labs ordered today.  Return to clinic in 6 months for reevaluation.  Call sooner if concerns arise.        Relevant Medications   venlafaxine XR (EFFEXOR-XR) 150 MG 24 hr capsule   Other Relevant Orders   Lipid Profile   Hyperlipidemia    Chronic.  Controlled.  Continue with current medication regimen on Crestor 4058maily.  Refills sent today.  Labs ordered today.  Return to clinic in 6 months for reevaluation.  Call sooner if concerns arise.        Relevant Medications   aspirin 81 MG EC tablet   rosuvastatin (CRESTOR) 40 MG tablet   Pre-diabetes    Labs ordered today. Will make recommendations based on lab results.       Relevant Orders   HgB A1c     Follow up plan: Return in about 6 months (around 08/14/2021) for Physical and Fasting labs.

## 2021-02-14 NOTE — Assessment & Plan Note (Signed)
Labs ordered today.  Will make recommendations based on lab results. ?

## 2021-02-15 LAB — COMPREHENSIVE METABOLIC PANEL
ALT: 16 IU/L (ref 0–32)
AST: 19 IU/L (ref 0–40)
Albumin/Globulin Ratio: 2 (ref 1.2–2.2)
Albumin: 4.7 g/dL (ref 3.8–4.8)
Alkaline Phosphatase: 86 IU/L (ref 44–121)
BUN/Creatinine Ratio: 19 (ref 12–28)
BUN: 13 mg/dL (ref 8–27)
Bilirubin Total: <0.2 mg/dL (ref 0.0–1.2)
CO2: 27 mmol/L (ref 20–29)
Calcium: 9.6 mg/dL (ref 8.7–10.3)
Chloride: 101 mmol/L (ref 96–106)
Creatinine, Ser: 0.68 mg/dL (ref 0.57–1.00)
Globulin, Total: 2.4 g/dL (ref 1.5–4.5)
Glucose: 78 mg/dL (ref 70–99)
Potassium: 4.4 mmol/L (ref 3.5–5.2)
Sodium: 142 mmol/L (ref 134–144)
Total Protein: 7.1 g/dL (ref 6.0–8.5)
eGFR: 98 mL/min/{1.73_m2} (ref >59)

## 2021-02-15 LAB — LIPID PANEL
Chol/HDL Ratio: 3.5 ratio (ref 0.0–4.4)
Cholesterol, Total: 153 mg/dL (ref 100–199)
HDL: 44 mg/dL (ref >39)
LDL Chol Calc (NIH): 86 mg/dL (ref 0–99)
Triglycerides: 129 mg/dL (ref 0–149)
VLDL Cholesterol Cal: 23 mg/dL (ref 5–40)

## 2021-02-15 LAB — HEMOGLOBIN A1C
Est. average glucose Bld gHb Est-mCnc: 128 mg/dL
Hgb A1c MFr Bld: 6.1 % — ABNORMAL HIGH (ref 4.8–5.6)

## 2021-02-15 NOTE — Progress Notes (Signed)
Please let patient know that her lab work looks Museum/gallery exhibitions officer.  A1c remains consistent with prior at 6.1.  Continue with current regimen.  Follow up as discussed.

## 2021-02-18 ENCOUNTER — Ambulatory Visit: Payer: Self-pay | Admitting: *Deleted

## 2021-02-18 DIAGNOSIS — M62838 Other muscle spasm: Secondary | ICD-10-CM | POA: Diagnosis not present

## 2021-02-18 DIAGNOSIS — S161XXA Strain of muscle, fascia and tendon at neck level, initial encounter: Secondary | ICD-10-CM | POA: Diagnosis not present

## 2021-02-18 NOTE — Telephone Encounter (Signed)
°  Chief Complaint: sharp pains in left side of neck Symptoms: pain in left side of neck where she has a pinched nerve Frequency: since yesterday evening after stretching.  Any movement causes pain. Pertinent Negatives: Patient denies N/A Disposition: [] ED /[x] Urgent Care (no appt availability in office) / [] Appointment(In office/virtual)/ []  [facility]/ [] Home Care/ [] Refused Recommended Disposition /[] [facility]/ []  Follow-up with PCP Additional Notes: I spoke with Alexis in the office and there are no appts available.

## 2021-02-18 NOTE — Telephone Encounter (Signed)
Reason for Disposition  [1] SEVERE neck pain (e.g., excruciating, unable to do any normal activities) AND [2] not improved after 2 hours of pain medicine  Answer Assessment - Initial Assessment Questions 1. ONSET: "When did the pain begin?"      Pt c/o neck paint that started yesterday evening.   I stretched and tilted my head to the left and now I'm having shooting pain in my neck on the left side.   I do have a pinched nerve in my neck.   I think it's the pinched nerve. 2. LOCATION: "Where does it hurt?"      Left side of neck.   Not radiating 3. PATTERN "Does the pain come and go, or has it been constant since it started?"      Any movement causes pain.    Sharp shooting pains.   I can't sleep.   I've tried cold and heat and nothing helps.   I've tried Tylenol too but not working. 4. SEVERITY: "How bad is the pain?"  (Scale 1-10; or mild, moderate, severe)   - NO PAIN (0): no pain or only slight stiffness    - MILD (1-3): doesn't interfere with normal activities    - MODERATE (4-7): interferes with normal activities or awakens from sleep    - SEVERE (8-10):  excruciating pain, unable to do any normal activities      *No Answer* 5. RADIATION: "Does the pain go anywhere else, shoot into your arms?"     *No Answer* 6. CORD SYMPTOMS: "Any weakness or numbness of the arms or legs?"     *No Answer* 7. CAUSE: "What do you think is causing the neck pain?"     *No Answer* 8. NECK OVERUSE: "Any recent activities that involved turning or twisting the neck?"     *No Answer* 9. OTHER SYMPTOMS: "Do you have any other symptoms?" (e.g., headache, fever, chest pain, difficulty breathing, neck swelling)     *No Answer* 10. PREGNANCY: "Is there any chance you are pregnant?" "When was your last menstrual period?"       *No Answer*  Protocols used: Neck Pain or Stiffness-A-AH

## 2021-03-14 DIAGNOSIS — M9931 Osseous stenosis of neural canal of cervical region: Secondary | ICD-10-CM | POA: Diagnosis not present

## 2021-03-29 ENCOUNTER — Other Ambulatory Visit: Payer: Self-pay | Admitting: Nurse Practitioner

## 2021-03-29 NOTE — Telephone Encounter (Signed)
Has newer rx of Effexor at local pharm and Crestor Dose has changed. Requested Prescriptions  Pending Prescriptions Disp Refills   venlafaxine XR (EFFEXOR-XR) 150 MG 24 hr capsule [Pharmacy Med Name: Venlafaxine HCl ER 150 MG Oral Capsule Extended Release 24 Hour] 90 capsule 3    Sig: TAKE 1 CAPSULE BY MOUTH  DAILY WITH BREAKFAST     Psychiatry: Antidepressants - SNRI - desvenlafaxine & venlafaxine Failed - 03/29/2021  6:35 AM      Failed - Lipid Panel in normal range within the last 12 months    Cholesterol, Total  Date Value Ref Range Status  02/14/2021 153 100 - 199 mg/dL Final   LDL Chol Calc (NIH)  Date Value Ref Range Status  02/14/2021 86 0 - 99 mg/dL Final   HDL  Date Value Ref Range Status  02/14/2021 44 >39 mg/dL Final   Triglycerides  Date Value Ref Range Status  02/14/2021 129 0 - 149 mg/dL Final         Passed - Cr in normal range and within 360 days    Creatinine, Ser  Date Value Ref Range Status  02/14/2021 0.68 0.57 - 1.00 mg/dL Final         Passed - Completed PHQ-2 or PHQ-9 in the last 360 days      Passed - Last BP in normal range    BP Readings from Last 1 Encounters:  02/14/21 115/70         Passed - Valid encounter within last 6 months    Recent Outpatient Visits          1 month ago Anxiety   [facility] [place], [place]   5 months ago Anxiety   [facility] Larae Grooms, NP   7 months ago Annual physical exam   [facility] Larae Grooms, NP   3 years ago Anxiety   [facility] Roosvelt Maser [place], [place]   3 years ago Acute non-recurrent maxillary sinusitis   [facility] Gabriel Cirri, NP      Future Appointments            In 4 months Larae Grooms, NP [facility], [facility]            rosuvastatin (CRESTOR) 20 MG tablet [Pharmacy Med Name: Rosuvastatin Calcium 20 MG Oral Tablet] 90 tablet 3    Sig: TAKE 1 TABLET BY MOUTH  DAILY      Cardiovascular:  Antilipid - Statins 2 Failed - 03/29/2021  6:35 AM      Failed - Lipid Panel in normal range within the last 12 months    Cholesterol, Total  Date Value Ref Range Status  02/14/2021 153 100 - 199 mg/dL Final   LDL Chol Calc (NIH)  Date Value Ref Range Status  02/14/2021 86 0 - 99 mg/dL Final   HDL  Date Value Ref Range Status  02/14/2021 44 >39 mg/dL Final   Triglycerides  Date Value Ref Range Status  02/14/2021 129 0 - 149 mg/dL Final         Passed - Cr in normal range and within 360 days    Creatinine, Ser  Date Value Ref Range Status  02/14/2021 0.68 0.57 - 1.00 mg/dL Final         Passed - Patient is not pregnant      Passed - Valid encounter within last 12 months    Recent Outpatient Visits  1 month ago Anxiety   [facility] Larae Grooms, NP   5 months ago Anxiety   [facility] Larae Grooms, NP   7 months ago Annual physical exam   [facility] Larae Grooms, NP   3 years ago Anxiety   [facility] Roosvelt Maser [place], [place]   3 years ago Acute non-recurrent maxillary sinusitis   [facility] Gabriel Cirri, NP      Future Appointments            In 4 months Larae Grooms, NP [facility], [facility]

## 2021-04-26 ENCOUNTER — Other Ambulatory Visit: Payer: Self-pay | Admitting: Nurse Practitioner

## 2021-04-26 DIAGNOSIS — R29898 Other symptoms and signs involving the musculoskeletal system: Secondary | ICD-10-CM | POA: Diagnosis not present

## 2021-04-28 ENCOUNTER — Ambulatory Visit (INDEPENDENT_AMBULATORY_CARE_PROVIDER_SITE_OTHER): Payer: Medicare HMO | Admitting: Nurse Practitioner

## 2021-04-28 ENCOUNTER — Encounter: Payer: Self-pay | Admitting: Nurse Practitioner

## 2021-04-28 ENCOUNTER — Other Ambulatory Visit: Payer: Self-pay

## 2021-04-28 VITALS — BP 117/69 | HR 93 | Temp 98.8°F | Wt 197.8 lb

## 2021-04-28 DIAGNOSIS — R3915 Urgency of urination: Secondary | ICD-10-CM | POA: Diagnosis not present

## 2021-04-28 DIAGNOSIS — R252 Cramp and spasm: Secondary | ICD-10-CM

## 2021-04-28 MED ORDER — ROPINIROLE HCL 0.25 MG PO TABS: 0.2500 mg | ORAL_TABLET | Freq: Every day | ORAL | 0 refills | Status: DC

## 2021-04-28 NOTE — Telephone Encounter (Signed)
Requested medication (s) are due for refill today: no ? ?Requested medication (s) are on the active medication list: yes   ? ?Last refill: 02/14/21  #90  1 refill ? ?Future visit scheduled yes ? ?Notes to clinic:Pt has appt today, please review. Thank you. ? ?Requested Prescriptions  ?Pending Prescriptions Disp Refills  ? venlafaxine XR (EFFEXOR-XR) 150 MG 24 hr capsule [Pharmacy Med Name: VENLAFAXINE HCL ER 150 MG Capsule Extended Release 24 Hour] 90 capsule 1  ?  Sig: Take 1 capsule (150 mg total) by mouth daily with breakfast.  ?  ? Psychiatry: Antidepressants - SNRI - desvenlafaxine & venlafaxine Failed - 04/26/2021 12:18 PM  ?  ?  Failed - Lipid Panel in normal range within the last 12 months  ?  Cholesterol, Total  ?Date Value Ref Range Status  ?02/14/2021 153 100 - 199 mg/dL Final  ? ?LDL Chol Calc (NIH)  ?Date Value Ref Range Status  ?02/14/2021 86 0 - 99 mg/dL Final  ? ?HDL  ?Date Value Ref Range Status  ?02/14/2021 44 >39 mg/dL Final  ? ?Triglycerides  ?Date Value Ref Range Status  ?02/14/2021 129 0 - 149 mg/dL Final  ? ?  ?  ?  Passed - Cr in normal range and within 360 days  ?  Creatinine, Ser  ?Date Value Ref Range Status  ?02/14/2021 0.68 0.57 - 1.00 mg/dL Final  ?  ?  ?  ?  Passed - Completed PHQ-2 or PHQ-9 in the last 360 days  ?  ?  Passed - Last BP in normal range  ?  BP Readings from Last 1 Encounters:  ?02/14/21 115/70  ?  ?  ?  ?  Passed - Valid encounter within last 6 months  ?  Recent Outpatient Visits   ? ?      ? 2 months ago Anxiety  ? [facility] Larae Grooms, NP  ? 6 months ago Anxiety  ? [facility] Larae Grooms, NP  ? 8 months ago Annual physical exam  ? [facility] Larae Grooms, NP  ? 3 years ago Anxiety  ? [facility] [place], [place], [place]  ? 3 years ago Acute non-recurrent maxillary sinusitis  ? [facility] Gabriel Cirri, NP  ? ?  ?  ?Future Appointments   ? ?        ? Today Larae Grooms, NP  [facility], [facility]  ? In 3 months Larae Grooms, NP [facility], [facility]  ? ?  ? ?  ?  ?  ? ? ? ? ?

## 2021-04-28 NOTE — Progress Notes (Signed)
? ?BP 117/69   Pulse 93   Temp 98.8 ?F (37.1 ?C) (Oral)   Wt 197 lb 12.8 oz (89.7 kg)   SpO2 97%   BMI 36.18 kg/m?   ? ?Subjective:  ? ? Patient ID: Jill Burch, female    DOB: 06/05/1960, 61 y.o.   MRN: [id number] ? ?HPI: ?Jill Burch is a 61 y.o. female ? ?Chief Complaint  ?Patient presents with  ? Nerve Issues  ?  Pt states she had 2 nerve tests done this past Tuesday. States she has been having some jerking in her R hand. States she has had trouble sleeping because of her nerves.   ? Urinary Urgency  ?  Pt states she has had trouble with urinary urgency for a while but has gotten worse in the last month   ? ? ?Patient states she has nerve testing done on Tuesday and since then she has had trouble sleeping because her nerves.  Patient does not know what kind of nerve testing she had done.  States it was done by Dr. Manuella Ghazi.  Patient states she hasn't gotten the results from the testing.  She does not have a follow up with them scheduled.  Patient states she has had a pinched nerve before.  States her arms will jerk at times.  She states that her left arm will fall asleep.   ? ?Patient states she has been having urinary urgency.  States she can't hold it for a minute but then it is too late.  She drinks cranberry juice.   ? ?Relevant past medical, surgical, family and social history reviewed and updated as indicated. Interim medical history since our last visit reviewed. ?Allergies and medications reviewed and updated. ? ?Review of Systems  ?Genitourinary:  Positive for urgency.  ?Neurological:  Positive for numbness.  ?     Arms jumping and falling asleep.  ? ?Per HPI unless specifically indicated above ? ?   ?Objective:  ?  ?BP 117/69   Pulse 93   Temp 98.8 ?F (37.1 ?C) (Oral)   Wt 197 lb 12.8 oz (89.7 kg)   SpO2 97%   BMI 36.18 kg/m?   ?Wt Readings from Last 3 Encounters:  ?04/28/21 197 lb 12.8 oz (89.7 kg)  ?02/14/21 199 lb 6.4 oz (90.4 kg)  ?10/14/20 223 lb (101.2 kg)  ?  ?Physical Exam ?Vitals and  nursing note reviewed.  ?Constitutional:   ?   General: She is not in acute distress. ?   Appearance: Normal appearance. She is obese. She is not ill-appearing, toxic-appearing or diaphoretic.  ?HENT:  ?   Head: Normocephalic.  ?   Right Ear: External ear normal.  ?   Left Ear: External ear normal.  ?   Nose: Nose normal.  ?   Mouth/Throat:  ?   Mouth: Mucous membranes are moist.  ?   Pharynx: Oropharynx is clear.  ?Eyes:  ?   General:     ?   Right eye: No discharge.     ?   Left eye: No discharge.  ?   Extraocular Movements: Extraocular movements intact.  ?   Conjunctiva/sclera: Conjunctivae normal.  ?   Pupils: Pupils are equal, round, and reactive to light.  ?Cardiovascular:  ?   Rate and Rhythm: Normal rate and regular rhythm.  ?   Heart sounds: No murmur heard. ?Pulmonary:  ?   Effort: Pulmonary effort is normal. No respiratory distress.  ?   Breath sounds: Normal breath  sounds. No wheezing or rales.  ?Musculoskeletal:  ?   Cervical back: Normal range of motion and neck supple.  ?Skin: ?   General: Skin is warm and dry.  ?   Capillary Refill: Capillary refill takes less than 2 seconds.  ?Neurological:  ?   General: No focal deficit present.  ?   Mental Status: She is alert and oriented to person, place, and time. Mental status is at baseline.  ?Psychiatric:     ?   Mood and Affect: Mood normal.     ?   Behavior: Behavior normal.     ?   Thought Content: Thought content normal.     ?   Judgment: Judgment normal.  ? ? ?Results for orders placed or performed in visit on 02/14/21  ?Comp Met (CMET)  ?Result Value Ref Range  ? Glucose 78 70 - 99 mg/dL  ? BUN 13 8 - 27 mg/dL  ? Creatinine, Ser 0.68 0.57 - 1.00 mg/dL  ? eGFR 98 >59 mL/min/1.73  ? BUN/Creatinine Ratio 19 12 - 28  ? Sodium 142 134 - 144 mmol/L  ? Potassium 4.4 3.5 - 5.2 mmol/L  ? Chloride 101 96 - 106 mmol/L  ? CO2 27 20 - 29 mmol/L  ? Calcium 9.6 8.7 - 10.3 mg/dL  ? Total Protein 7.1 6.0 - 8.5 g/dL  ? Albumin 4.7 3.8 - 4.8 g/dL  ? Globulin, Total 2.4  1.5 - 4.5 g/dL  ? Albumin/Globulin Ratio 2.0 1.2 - 2.2  ? Bilirubin Total <0.2 0.0 - 1.2 mg/dL  ? Alkaline Phosphatase 86 44 - 121 IU/L  ? AST 19 0 - 40 IU/L  ? ALT 16 0 - 32 IU/L  ?Lipid Profile  ?Result Value Ref Range  ? Cholesterol, Total 153 100 - 199 mg/dL  ? Triglycerides 129 0 - 149 mg/dL  ? HDL 44 >39 mg/dL  ? VLDL Cholesterol Cal 23 5 - 40 mg/dL  ? LDL Chol Calc (NIH) 86 0 - 99 mg/dL  ? Chol/HDL Ratio 3.5 0.0 - 4.4 ratio  ?HgB A1c  ?Result Value Ref Range  ? Hgb A1c MFr Bld 6.1 (H) 4.8 - 5.6 %  ? Est. average glucose Bld gHb Est-mCnc 128 mg/dL  ? ?   ?Assessment & Plan:  ? ?Problem List Items Addressed This Visit   ?None ?Visit Diagnoses   ? ? Urinary urgency    -  Primary  ? Recommend pelvic floor exercises. Information given to patient on AVS. Can refer to Pelvic Floor PT if symptoms are ongoing and possibly Urology. FU in 1 month.  ? Hand or foot spasms      ? Ongoing. Had nerve testing done on Tuesday not sure what it was or when she will get the results. Will try Requip 0.36m nightly. S/E and benefits discussed.  ? ?  ?  ? ?Follow up plan: ?Return in about 1 month (around 05/29/2021) for Nerve medication and Bladder. ? ? ? ? ? ?

## 2021-05-21 ENCOUNTER — Other Ambulatory Visit: Payer: Self-pay | Admitting: Nurse Practitioner

## 2021-05-23 DIAGNOSIS — E785 Hyperlipidemia, unspecified: Secondary | ICD-10-CM | POA: Diagnosis not present

## 2021-05-23 DIAGNOSIS — R7303 Prediabetes: Secondary | ICD-10-CM | POA: Diagnosis not present

## 2021-05-23 NOTE — Telephone Encounter (Signed)
Requested Prescriptions  ?Pending Prescriptions Disp Refills  ?? rOPINIRole (REQUIP) 0.25 MG tablet [Pharmacy Med Name: ROPINIROLE HYDROCHLORIDE 0.25 MG Tablet] 30 tablet 0  ?  Sig: TAKE 1 TABLET AT BEDTIME  ?  ? Neurology:  Parkinsonian Agents Passed - 05/21/2021  3:04 AM  ?  ?  Passed - Last BP in normal range  ?  BP Readings from Last 1 Encounters:  ?04/28/21 117/69  ?   ?  ?  Passed - Last Heart Rate in normal range  ?  Pulse Readings from Last 1 Encounters:  ?04/28/21 93  ?   ?  ?  Passed - Valid encounter within last 12 months  ?  Recent Outpatient Visits   ?      ? 3 weeks ago Urinary urgency  ? [place], NP  ? 3 months ago Anxiety  ? [place], NP  ? 7 months ago Anxiety  ? [place], NP  ? 9 months ago Annual physical exam  ? [place], NP  ? 3 years ago Anxiety  ? [place], [place], [place]  ?  ?  ?Future Appointments   ?        ? In 1 week Jon Billings, NP [facility], [facility]  ? In 2 months Jon Billings, NP [facility], [facility]  ?  ? ?  ?  ?  ? ? ?

## 2021-05-30 DIAGNOSIS — R7303 Prediabetes: Secondary | ICD-10-CM | POA: Diagnosis not present

## 2021-05-30 DIAGNOSIS — Z0001 Encounter for general adult medical examination with abnormal findings: Secondary | ICD-10-CM | POA: Diagnosis not present

## 2021-05-30 DIAGNOSIS — E785 Hyperlipidemia, unspecified: Secondary | ICD-10-CM | POA: Diagnosis not present

## 2021-05-30 DIAGNOSIS — Z1231 Encounter for screening mammogram for malignant neoplasm of breast: Secondary | ICD-10-CM | POA: Diagnosis not present

## 2021-05-30 DIAGNOSIS — F32A Depression, unspecified: Secondary | ICD-10-CM | POA: Diagnosis not present

## 2021-05-30 DIAGNOSIS — Z0000 Encounter for general adult medical examination without abnormal findings: Secondary | ICD-10-CM | POA: Diagnosis not present

## 2021-06-01 ENCOUNTER — Encounter: Payer: Self-pay | Admitting: Nurse Practitioner

## 2021-06-01 ENCOUNTER — Ambulatory Visit (INDEPENDENT_AMBULATORY_CARE_PROVIDER_SITE_OTHER): Payer: Medicare HMO | Admitting: Nurse Practitioner

## 2021-06-01 VITALS — BP 136/81 | HR 85 | Temp 98.5°F | Wt 198.8 lb

## 2021-06-01 DIAGNOSIS — R3915 Urgency of urination: Secondary | ICD-10-CM

## 2021-06-01 DIAGNOSIS — R252 Cramp and spasm: Secondary | ICD-10-CM | POA: Diagnosis not present

## 2021-06-01 MED ORDER — ROPINIROLE HCL 0.5 MG PO TABS: 0.5000 mg | ORAL_TABLET | Freq: Every day | ORAL | 1 refills | Status: DC

## 2021-06-01 NOTE — Progress Notes (Signed)
? ?BP 136/81   Pulse 85   Temp 98.5 ?F (36.9 ?C) (Oral)   Wt 198 lb 12.8 oz (90.2 kg)   SpO2 95%   BMI 36.36 kg/m?   ? ?Subjective:  ? ? Patient ID: Jill Burch, female    DOB: Jun 13, 1960, 61 y.o.   MRN: [id number] ? ?HPI: ?Jill Burch is a 61 y.o. female ? ?Chief Complaint  ?Patient presents with  ? Urinary Urgency  ? ? ?Patient states she is doing well besides her hands bothering her.  She was found to have carpal tunnel which is causing her weakness in her hands.  [facility] who doubled the dose of the Requip. ? ?Patient states she has been doing the pelvic floor exercises which she states is helping some with her urinary urgency.   ? ?Relevant past medical, surgical, family and social history reviewed and updated as indicated. Interim medical history since our last visit reviewed. ?Allergies and medications reviewed and updated. ? ?Review of Systems  ?Genitourinary:  Positive for urgency.  ?Neurological:  Positive for weakness and numbness.  ?     Arms jumping and falling asleep.  ? ?Per HPI unless specifically indicated above ? ?   ?Objective:  ?  ?BP 136/81   Pulse 85   Temp 98.5 ?F (36.9 ?C) (Oral)   Wt 198 lb 12.8 oz (90.2 kg)   SpO2 95%   BMI 36.36 kg/m?   ?Wt Readings from Last 3 Encounters:  ?06/01/21 198 lb 12.8 oz (90.2 kg)  ?04/28/21 197 lb 12.8 oz (89.7 kg)  ?02/14/21 199 lb 6.4 oz (90.4 kg)  ?  ?Physical Exam ?Vitals and nursing note reviewed.  ?Constitutional:   ?   General: She is not in acute distress. ?   Appearance: Normal appearance. She is obese. She is not ill-appearing, toxic-appearing or diaphoretic.  ?HENT:  ?   Head: Normocephalic.  ?   Right Ear: External ear normal.  ?   Left Ear: External ear normal.  ?   Nose: Nose normal.  ?   Mouth/Throat:  ?   Mouth: Mucous membranes are moist.  ?   Pharynx: Oropharynx is clear.  ?Eyes:  ?   General:     ?   Right eye: No discharge.     ?   Left eye: No discharge.  ?   Extraocular Movements: Extraocular movements  intact.  ?   Conjunctiva/sclera: Conjunctivae normal.  ?   Pupils: Pupils are equal, round, and reactive to light.  ?Cardiovascular:  ?   Rate and Rhythm: Normal rate and regular rhythm.  ?   Heart sounds: No murmur heard. ?Pulmonary:  ?   Effort: Pulmonary effort is normal. No respiratory distress.  ?   Breath sounds: Normal breath sounds. No wheezing or rales.  ?Musculoskeletal:  ?   Cervical back: Normal range of motion and neck supple.  ?Skin: ?   General: Skin is warm and dry.  ?   Capillary Refill: Capillary refill takes less than 2 seconds.  ?Neurological:  ?   General: No focal deficit present.  ?   Mental Status: She is alert and oriented to person, place, and time. Mental status is at baseline.  ?Psychiatric:     ?   Mood and Affect: Mood normal.     ?   Behavior: Behavior normal.     ?   Thought Content: Thought content normal.     ?   Judgment:  Judgment normal.  ? ? ?Results for orders placed or performed in visit on 02/14/21  ?Comp Met (CMET)  ?Result Value Ref Range  ? Glucose 78 70 - 99 mg/dL  ? BUN 13 8 - 27 mg/dL  ? Creatinine, Ser 0.68 0.57 - 1.00 mg/dL  ? eGFR 98 >59 mL/min/1.73  ? BUN/Creatinine Ratio 19 12 - 28  ? Sodium 142 134 - 144 mmol/L  ? Potassium 4.4 3.5 - 5.2 mmol/L  ? Chloride 101 96 - 106 mmol/L  ? CO2 27 20 - 29 mmol/L  ? Calcium 9.6 8.7 - 10.3 mg/dL  ? Total Protein 7.1 6.0 - 8.5 g/dL  ? Albumin 4.7 3.8 - 4.8 g/dL  ? Globulin, Total 2.4 1.5 - 4.5 g/dL  ? Albumin/Globulin Ratio 2.0 1.2 - 2.2  ? Bilirubin Total <0.2 0.0 - 1.2 mg/dL  ? Alkaline Phosphatase 86 44 - 121 IU/L  ? AST 19 0 - 40 IU/L  ? ALT 16 0 - 32 IU/L  ?Lipid Profile  ?Result Value Ref Range  ? Cholesterol, Total 153 100 - 199 mg/dL  ? Triglycerides 129 0 - 149 mg/dL  ? HDL 44 >39 mg/dL  ? VLDL Cholesterol Cal 23 5 - 40 mg/dL  ? LDL Chol Calc (NIH) 86 0 - 99 mg/dL  ? Chol/HDL Ratio 3.5 0.0 - 4.4 ratio  ?HgB A1c  ?Result Value Ref Range  ? Hgb A1c MFr Bld 6.1 (H) 4.8 - 5.6 %  ? Est. average glucose Bld gHb Est-mCnc 128  mg/dL  ? ?   ?Assessment & Plan:  ? ?Problem List Items Addressed This Visit   ?None ?Visit Diagnoses   ? ? Urinary urgency    -  Primary  ? Continue with pelvic floor exercises. If symptoms do not improve, recommend seeing pelvic floor specialist.  ? Hand or foot spasms      ? Will increase dose of Requip to 0.19m.  Keep appointment with neurosurgeon for may.  Follow up in 3 months for reevaluation.   ? ?  ?  ? ?Follow up plan: ?Return in about 3 months (around 08/31/2021) for HTN, HLD, DM2 FU and follow up in Requip. ? ? ? ? ? ?

## 2021-06-21 DIAGNOSIS — M5412 Radiculopathy, cervical region: Secondary | ICD-10-CM | POA: Diagnosis not present

## 2021-06-21 DIAGNOSIS — M542 Cervicalgia: Secondary | ICD-10-CM | POA: Diagnosis not present

## 2021-06-21 DIAGNOSIS — M6281 Muscle weakness (generalized): Secondary | ICD-10-CM | POA: Diagnosis not present

## 2021-06-21 DIAGNOSIS — R29898 Other symptoms and signs involving the musculoskeletal system: Secondary | ICD-10-CM | POA: Diagnosis not present

## 2021-06-21 DIAGNOSIS — M436 Torticollis: Secondary | ICD-10-CM | POA: Diagnosis not present

## 2021-06-30 DIAGNOSIS — M5412 Radiculopathy, cervical region: Secondary | ICD-10-CM | POA: Diagnosis not present

## 2021-06-30 DIAGNOSIS — M436 Torticollis: Secondary | ICD-10-CM | POA: Diagnosis not present

## 2021-06-30 DIAGNOSIS — M6281 Muscle weakness (generalized): Secondary | ICD-10-CM | POA: Diagnosis not present

## 2021-06-30 DIAGNOSIS — M542 Cervicalgia: Secondary | ICD-10-CM | POA: Diagnosis not present

## 2021-06-30 DIAGNOSIS — R29898 Other symptoms and signs involving the musculoskeletal system: Secondary | ICD-10-CM | POA: Diagnosis not present

## 2021-08-15 ENCOUNTER — Encounter: Payer: Medicare HMO | Admitting: Nurse Practitioner

## 2021-08-18 ENCOUNTER — Telehealth: Payer: Self-pay | Admitting: Nurse Practitioner

## 2021-08-18 NOTE — Telephone Encounter (Signed)
Copied from CRM [id number]. Topic: Medicare AWV >> Aug 18, 2021 10:26 AM Granville Lewis wrote: Reason for CRM:  Left message for patient to call back and schedule Medicare Annual Wellness Visit (AWV) to be done virtually or by telephone.  No hx of AWV eligible as of 08/06/21  Please schedule at anytime with [facility]-Nurse Health Advisor.      45 Minutes appointment   Any questions, please call me at [phone]

## 2021-08-31 ENCOUNTER — Encounter: Payer: Self-pay | Admitting: Nurse Practitioner

## 2021-08-31 ENCOUNTER — Ambulatory Visit (INDEPENDENT_AMBULATORY_CARE_PROVIDER_SITE_OTHER): Payer: Medicare HMO | Admitting: Nurse Practitioner

## 2021-08-31 VITALS — BP 122/81 | HR 60 | Temp 98.4°F | Wt 197.8 lb

## 2021-08-31 DIAGNOSIS — Z1211 Encounter for screening for malignant neoplasm of colon: Secondary | ICD-10-CM | POA: Diagnosis not present

## 2021-08-31 DIAGNOSIS — Z1231 Encounter for screening mammogram for malignant neoplasm of breast: Secondary | ICD-10-CM

## 2021-08-31 DIAGNOSIS — E785 Hyperlipidemia, unspecified: Secondary | ICD-10-CM

## 2021-08-31 DIAGNOSIS — Z0000 Encounter for general adult medical examination without abnormal findings: Secondary | ICD-10-CM | POA: Diagnosis not present

## 2021-08-31 DIAGNOSIS — R7303 Prediabetes: Secondary | ICD-10-CM | POA: Diagnosis not present

## 2021-08-31 DIAGNOSIS — F419 Anxiety disorder, unspecified: Secondary | ICD-10-CM

## 2021-08-31 DIAGNOSIS — F32A Depression, unspecified: Secondary | ICD-10-CM | POA: Diagnosis not present

## 2021-08-31 LAB — URINALYSIS, ROUTINE W REFLEX MICROSCOPIC
Bilirubin, UA: NEGATIVE
Glucose, UA: NEGATIVE
Ketones, UA: NEGATIVE
Leukocytes,UA: NEGATIVE
Nitrite, UA: NEGATIVE
Protein,UA: NEGATIVE
RBC, UA: NEGATIVE
Specific Gravity, UA: 1.02 (ref 1.005–1.030)
Urobilinogen, Ur: 0.2 mg/dL (ref 0.2–1.0)
pH, UA: 7 (ref 5.0–7.5)

## 2021-08-31 MED ORDER — VENLAFAXINE HCL ER 150 MG PO CP24: 150.0000 mg | ORAL_CAPSULE | Freq: Every day | ORAL | 1 refills | Status: DC

## 2021-08-31 MED ORDER — ROSUVASTATIN CALCIUM 40 MG PO TABS: 40.0000 mg | ORAL_TABLET | Freq: Every day | ORAL | 1 refills | Status: DC

## 2021-08-31 NOTE — Progress Notes (Deleted)
There were no vitals taken for this visit.   Subjective:    Patient ID: Jill Burch, female    DOB: 08-09-1960, 61 y.o.   MRN: [id number]  HPI: [name] is a 61 y.o. female presenting on 08/31/2021 for comprehensive medical examination. Current medical complaints include:{Blank single:19197::"none","***"}  He currently lives with: Interim Problems from his last visit: {Blank single:19197::"yes","no"}  HYPERLIPIDEMIA Hyperlipidemia status: {Blank single:19197::"excellent compliance","good compliance","fair compliance","poor compliance"} Satisfied with current treatment?  {Blank single:19197::"yes","no"} Side effects:  {Blank single:19197::"yes","no"} Medication compliance: {Blank single:19197::"excellent compliance","good compliance","fair compliance","poor compliance"} Past cholesterol meds: {Blank multiple:19196::"none","atorvastain (lipitor)","lovastatin (mevacor)","pravastatin (pravachol)","rosuvastatin (crestor)","simvastatin (zocor)","vytorin","fenofibrate (tricor)","gemfibrozil","ezetimide (zetia)","niaspan","lovaza"} Supplements: {Blank multiple:19196::"none","fish oil","niacin","red yeast rice"} Aspirin:  {Blank single:19197::"yes","no"} The 10-year ASCVD risk score (Arnett DK, et al., 2019) is: 4.8%   Values used to calculate the score:     Age: 66 years     Sex: Female     Is Non-Hispanic African American: No     Diabetic: No     Tobacco smoker: No     Systolic Blood Pressure: 761 mmHg     Is BP treated: No     HDL Cholesterol: 44 mg/dL     Total Cholesterol: 153 mg/dL Chest pain:  {Blank single:19197::"yes","no"} Coronary artery disease:  {Blank single:19197::"yes","no"} Family history CAD:  {Blank single:19197::"yes","no"} Family history early CAD:  {Blank single:19197::"yes","no"}  DEPRESSION/ANXIETY   Depression Screen done today and results listed below:     06/01/2021    3:14 PM 04/28/2021    3:18 PM 02/14/2021    3:27 PM 10/13/2020    1:48 PM 08/25/2020     3:54 PM  Depression screen PHQ 2/9  Decreased Interest 0 2 0 1 1  Down, Depressed, Hopeless _0 0 0  PHQ - 2 Score _1 Altered sleeping _2 Tired, decreased energy _3 Change in appetite 1 1 0 3 2  Feeling bad or failure about yourself  0 0 0 0 0  Trouble concentrating 0 1 0 3 2  Moving slowly or fidgety/restless 0 0 0 0 0  Suicidal thoughts 0 0 0 0 0  PHQ-9 Score _4 Difficult doing work/chores Somewhat difficult Somewhat difficult Not difficult at all  Not difficult at all    The patient {has/does not have:19849} a history of falls. I {did/did not:19850} complete a risk assessment for falls. A plan of care for falls {was/was not:19852} documented.   Past Medical History:  Past Medical History:  Diagnosis Date   Anxiety    Depression    Hyperlipidemia    Osteoarthritis    PONV (postoperative nausea and vomiting)    Pre-diabetes     Surgical History:  Past Surgical History:  Procedure Laterality Date   APPLICATION OF WOUND VAC  10/14/2020   Procedure: APPLICATION OF WOUND VAC;  Surgeon: Corky Mull, MD;  Location: [facility] OR[facility];  Service: Orthopedics;;   TOTAL KNEE ARTHROPLASTY Left 10/14/2020   Procedure: TOTAL KNEE ARTHROPLASTY;  Surgeon: Corky Mull, MD;  Location: [facility];  Service: Orthopedics;  Laterality: Left;   TUBAL LIGATION  1995    Medications:  Current Outpatient Medications on File Prior to Visit  Medication Sig   acetaminophen (TYLENOL) 500 MG tablet Take 2 tablets (1,000 mg total) by mouth every 6 (six) hours as needed.   aspirin 81 MG EC tablet Take 81 mg by mouth daily. Swallow whole.  Homeopathic Products (ARNICARE) GEL Apply topically as needed.   Lidocaine HCl (ASPERCREME LIDOCAINE) 4 % LIQD Apply topically as needed.   Multiple Vitamins-Minerals (ADULT GUMMY PO) Take 2 capsules by mouth daily.   rOPINIRole (REQUIP) 0.5 MG tablet Take 1 tablet (0.5 mg total) by mouth at bedtime.   rosuvastatin (CRESTOR) 40 MG tablet  Take 1 tablet (40 mg total) by mouth daily.   venlafaxine XR (EFFEXOR-XR) 150 MG 24 hr capsule TAKE 1 CAPSULE (150 MG TOTAL) BY MOUTH DAILY WITH BREAKFAST.   No current facility-administered medications on file prior to visit.    Allergies:  Allergies  Allergen Reactions   Lactose Diarrhea    Bloating, gas    Lactose Intolerance (Gi) Diarrhea    Bloating, gas     Social History:  Social History   Socioeconomic History   Marital status: Married    Spouse name: Sonia Side   Number of children: 1   Years of education: Not on file   Highest education level: Not on file  Occupational History   Not on file  Tobacco Use   Smoking status: Former    Types: Cigarettes    Quit date: 02/06/1990    Years since quitting: 31.5   Smokeless tobacco: Never  Vaping Use   Vaping Use: Never used  Substance and Sexual Activity   Alcohol use: No    Alcohol/week: 0.0 standard drinks of alcohol   Drug use: No   Sexual activity: Not on file  Other Topics Concern   Not on file  Social History Narrative   Live at home with husband.   Social Determinants of Health   Financial Resource Strain: Not on file  Food Insecurity: Not on file  Transportation Needs: Not on file  Physical Activity: Not on file  Stress: Not on file  Social Connections: Not on file  Intimate Partner Violence: Not on file   Social History   Tobacco Use  Smoking Status Former   Types: Cigarettes   Quit date: 02/06/1990   Years since quitting: 31.5  Smokeless Tobacco Never   Social History   Substance and Sexual Activity  Alcohol Use No   Alcohol/week: 0.0 standard drinks of alcohol    Family History:  Family History  Problem Relation Age of Onset   Depression Mother    Arthritis Mother    Heart disease Mother    Heart attack Mother    Cancer Father        bladder   Diabetes Father    Depression Father    Hypertension Father    Diabetes Brother    COPD Neg Hx    Stroke Neg Hx     Past medical  history, surgical history, medications, allergies, family history and social history reviewed with patient today and changes made to appropriate areas of the chart.   ROS All other ROS negative except what is listed above and in the HPI.      Objective:    There were no vitals taken for this visit.  Wt Readings from Last 3 Encounters:  06/01/21 198 lb 12.8 oz (90.2 kg)  04/28/21 197 lb 12.8 oz (89.7 kg)  02/14/21 199 lb 6.4 oz (90.4 kg)    Physical Exam  Results for orders placed or performed in visit on 02/14/21  Comp Met (CMET)  Result Value Ref Range   Glucose 78 70 - 99 mg/dL   BUN 13 8 - 27 mg/dL   Creatinine, Ser 0.68 0.57 - 1.00  mg/dL   eGFR 98 >59 mL/min/1.73   BUN/Creatinine Ratio 19 12 - 28   Sodium 142 134 - 144 mmol/L   Potassium 4.4 3.5 - 5.2 mmol/L   Chloride 101 96 - 106 mmol/L   CO2 27 20 - 29 mmol/L   Calcium 9.6 8.7 - 10.3 mg/dL   Total Protein 7.1 6.0 - 8.5 g/dL   Albumin 4.7 3.8 - 4.8 g/dL   Globulin, Total 2.4 1.5 - 4.5 g/dL   Albumin/Globulin Ratio 2.0 1.2 - 2.2   Bilirubin Total <0.2 0.0 - 1.2 mg/dL   Alkaline Phosphatase 86 44 - 121 IU/L   AST 19 0 - 40 IU/L   ALT 16 0 - 32 IU/L  Lipid Profile  Result Value Ref Range   Cholesterol, Total 153 100 - 199 mg/dL   Triglycerides 129 0 - 149 mg/dL   HDL 44 >39 mg/dL   VLDL Cholesterol Cal 23 5 - 40 mg/dL   LDL Chol Calc (NIH) 86 0 - 99 mg/dL   Chol/HDL Ratio 3.5 0.0 - 4.4 ratio  HgB A1c  Result Value Ref Range   Hgb A1c MFr Bld 6.1 (H) 4.8 - 5.6 %   Est. average glucose Bld gHb Est-mCnc 128 mg/dL      Assessment & Plan:   Problem List Items Addressed This Visit       Other   Anxiety   Depression   Hyperlipidemia   Pre-diabetes   Other Visit Diagnoses     Annual physical exam    -  Primary        Discussed aspirin prophylaxis for myocardial infarction prevention and decision was {Blank single:19197::"it was not indicated","made to continue ASA","made to start ASA","made to stop  ASA","that we recommended ASA, and patient refused"}  LABORATORY TESTING:  Health maintenance labs ordered today as discussed above.   The natural history of prostate cancer and ongoing controversy regarding screening and potential treatment outcomes of prostate cancer has been discussed with the patient. The meaning of a false positive PSA and a false negative PSA has been discussed. He indicates understanding of the limitations of this screening test and wishes *** to proceed with screening PSA testing.   IMMUNIZATIONS:   - Tdap: Tetanus vaccination status reviewed: {tetanus status:315746}. - Influenza: {Blank single:19197::"Up to date","Administered today","Postponed to flu season","Refused","Given elsewhere"} - Pneumovax: {Blank single:19197::"Up to date","Administered today","Not applicable","Refused","Given elsewhere"} - Prevnar: {Blank single:19197::"Up to date","Administered today","Not applicable","Refused","Given elsewhere"} - COVID: {Blank single:19197::"Up to date","Administered today","Not applicable","Refused","Given elsewhere"} - HPV: {Blank single:19197::"Up to date","Administered today","Not applicable","Refused","Given elsewhere"} - Shingrix vaccine: {Blank single:19197::"Up to date","Administered today","Not applicable","Refused","Given elsewhere"}  SCREENING: - Colonoscopy: {Blank single:19197::"Up to date","Ordered today","Not applicable","Refused","Done elsewhere"}  Discussed with patient purpose of the colonoscopy is to detect colon cancer at curable precancerous or early stages   - AAA Screening: {Blank single:19197::"Up to date","Ordered today","Not applicable","Refused","Done elsewhere"}  -Hearing Test: {Blank single:19197::"Up to date","Ordered today","Not applicable","Refused","Done elsewhere"}  -Spirometry: {Blank single:19197::"Up to date","Ordered today","Not applicable","Refused","Done elsewhere"}   PATIENT COUNSELING:    Sexuality: Discussed sexually  transmitted diseases, partner selection, use of condoms, avoidance of unintended pregnancy  and contraceptive alternatives.   Advised to avoid cigarette smoking.  I discussed with the patient that most people either abstain from alcohol or drink within safe limits (<=14/week and <=4 drinks/occasion for males, <=7/weeks and <= 3 drinks/occasion for females) and that the risk for alcohol disorders and other health effects rises proportionally with the number of drinks per week and how often a drinker exceeds daily limits.  Discussed cessation/primary prevention of drug use and availability of treatment for abuse.   Diet: Encouraged to adjust caloric intake to maintain  or achieve ideal body weight, to reduce intake of dietary saturated fat and total fat, to limit sodium intake by avoiding high sodium foods and not adding table salt, and to maintain adequate dietary potassium and calcium preferably from fresh fruits, vegetables, and low-fat dairy products.    stressed the importance of regular exercise  Injury prevention: Discussed safety belts, safety helmets, smoke detector, smoking near bedding or upholstery.   Dental health: Discussed importance of regular tooth brushing, flossing, and dental visits.   Follow up plan: NEXT PREVENTATIVE PHYSICAL DUE IN 1 YEAR. No follow-ups on file.

## 2021-08-31 NOTE — Assessment & Plan Note (Signed)
Chronic.  Controlled.  Continue with current medication regimen on Effexor 150mg daily.  Refill sent today.  Labs ordered today.  Return to clinic in 6 months for reevaluation.  Call sooner if concerns arise.   

## 2021-08-31 NOTE — Assessment & Plan Note (Signed)
Chronic.  Controlled.  Continue with current medication regimen on Effexor 150mg  daily.  Refill sent today.  Labs ordered today.  Return to clinic in 6 months for reevaluation.  Call sooner if concerns arise.

## 2021-08-31 NOTE — Assessment & Plan Note (Signed)
Labs ordered today.  Will make recommendations based on lab results. ?

## 2021-08-31 NOTE — Assessment & Plan Note (Signed)
Chronic.  Controlled.  Continue with current medication regimen of Crestor 40mg.   Labs ordered today.  Return to clinic in 6 months for reevaluation.  Call sooner if concerns arise.   

## 2021-08-31 NOTE — Progress Notes (Signed)
BP 122/81   Pulse 60   Temp 98.4 F (36.9 C) (Oral)   Wt 197 lb 12.8 oz (89.7 kg)   SpO2 97%   BMI 36.18 kg/m    Subjective:    Patient ID: Jill Burch, female    DOB: [birth year], 61 y.o.   MRN: [id number]  HPI: Jill Burch is a 61 y.o. female presenting on 08/31/2021 for comprehensive medical examination. Current medical complaints include:none  She currently lives with: Menopausal Symptoms: no  HYPERLIPIDEMIA Hyperlipidemia status: excellent compliance Satisfied with current treatment?  no Side effects:  no Medication compliance: excellent compliance Past cholesterol meds: rosuvastatin (crestor) Supplements: none Aspirin:  no The 10-year ASCVD risk score (Arnett DK, et al., 2019) is: 3.9%   Values used to calculate the score:     Age: 61 years     Sex: Female     Is Non-Hispanic African American: No     Diabetic: No     Tobacco smoker: No     Systolic Blood Pressure: 545 mmHg     Is BP treated: No     HDL Cholesterol: 44 mg/dL     Total Cholesterol: 153 mg/dL Chest pain:  no Coronary artery disease:  no Family history CAD:  no Family history early CAD:  no  Denies HA, CP, SOB, dizziness, palpitations, visual changes, and lower extremity swelling.   DEPRESSION/ANXIETY Patient states she is doing well.  Denies concerns with medication at visit today.  She feels like this is a good dose for her.  Denies SI.   Depression Screen done today and results listed below:     08/31/2021    3:12 PM 06/01/2021    3:14 PM 04/28/2021    3:18 PM 02/14/2021    3:27 PM 10/13/2020    1:48 PM  Depression screen PHQ 2/9  Decreased Interest 0 0 2 0 1  Down, Depressed, Hopeless '2 2 2 1 ' 0  PHQ - 2 Score '2 2 4 1 1  ' Altered sleeping '3 3 3 3 3  ' Tired, decreased energy '2 3 1 1 3  ' Change in appetite '1 1 1 ' 0 3  Feeling bad or failure about yourself  2 0 0 0 0  Trouble concentrating 2 0 1 0 3  Moving slowly or fidgety/restless 0 0 0 0 0  Suicidal thoughts 0 0 0 0 0  PHQ-9 Score '12 9  10 5 13  ' Difficult doing work/chores Not difficult at all Somewhat difficult Somewhat difficult Not difficult at all     The patient does not have a history of falls. I did complete a risk assessment for falls. A plan of care for falls was documented.   Past Medical History:  Past Medical History:  Diagnosis Date   Anxiety    Depression    Hyperlipidemia    Osteoarthritis    PONV (postoperative nausea and vomiting)    Pre-diabetes     Surgical History:  Past Surgical History:  Procedure Laterality Date   APPLICATION OF WOUND VAC  10/14/2020   Procedure: APPLICATION OF WOUND VAC;  Surgeon: Corky Mull, MD;  Location: [facility] ORS;  Service: Orthopedics;;   TOTAL KNEE ARTHROPLASTY Left 10/14/2020   Procedure: TOTAL KNEE ARTHROPLASTY;  Surgeon: Corky Mull, MD;  Location: [facility] ORS;  Service: Orthopedics;  Laterality: Left;   TUBAL LIGATION  1995    Medications:  Current Outpatient Medications on File Prior to Visit  Medication Sig   acetaminophen (TYLENOL) 500  MG tablet Take 2 tablets (1,000 mg total) by mouth every 6 (six) hours as needed.   aspirin 81 MG EC tablet Take 81 mg by mouth daily. Swallow whole.   Homeopathic Products (ARNICARE) GEL Apply topically as needed.   Lidocaine HCl (ASPERCREME LIDOCAINE) 4 % LIQD Apply topically as needed.   Multiple Vitamins-Minerals (ADULT GUMMY PO) Take 2 capsules by mouth daily.   rOPINIRole (REQUIP) 0.5 MG tablet Take 1 tablet (0.5 mg total) by mouth at bedtime.   No current facility-administered medications on file prior to visit.    Allergies:  Allergies  Allergen Reactions   Lactose Diarrhea    Bloating, gas    Lactose Intolerance (Gi) Diarrhea    Bloating, gas     Social History:  Social History   Socioeconomic History   Marital status: Married    Spouse name: Sonia Side   Number of children: 1   Years of education: Not on file   Highest education level: Not on file  Occupational History   Not on file  Tobacco Use    Smoking status: Former    Types: Cigarettes    Quit date: 02/06/1990    Years since quitting: 31.5   Smokeless tobacco: Never  Vaping Use   Vaping Use: Never used  Substance and Sexual Activity   Alcohol use: No    Alcohol/week: 0.0 standard drinks of alcohol   Drug use: No   Sexual activity: Not Currently  Other Topics Concern   Not on file  Social History Narrative   Live at home with husband.   Social Determinants of Health   Financial Resource Strain: Not on file  Food Insecurity: Not on file  Transportation Needs: Not on file  Physical Activity: Not on file  Stress: Not on file  Social Connections: Not on file  Intimate Partner Violence: Not on file   Social History   Tobacco Use  Smoking Status Former   Types: Cigarettes   Quit date: 02/06/1990   Years since quitting: 31.5  Smokeless Tobacco Never   Social History   Substance and Sexual Activity  Alcohol Use No   Alcohol/week: 0.0 standard drinks of alcohol    Family History:  Family History  Problem Relation Age of Onset   Depression Mother    Arthritis Mother    Heart disease Mother    Heart attack Mother    Cancer Father        bladder   Diabetes Father    Depression Father    Hypertension Father    Diabetes Brother    COPD Neg Hx    Stroke Neg Hx     Past medical history, surgical history, medications, allergies, family history and social history reviewed with patient today and changes made to appropriate areas of the chart.   Review of Systems  Eyes:  Negative for blurred vision and double vision.  Respiratory:  Negative for shortness of breath.   Cardiovascular:  Negative for chest pain, palpitations and leg swelling.  Neurological:  Negative for dizziness and headaches.   All other ROS negative except what is listed above and in the HPI.      Objective:    BP 122/81   Pulse 60   Temp 98.4 F (36.9 C) (Oral)   Wt 197 lb 12.8 oz (89.7 kg)   SpO2 97%   BMI 36.18 kg/m   Wt Readings  from Last 3 Encounters:  08/31/21 197 lb 12.8 oz (89.7 kg)  06/01/21 198  lb 12.8 oz (90.2 kg)  04/28/21 197 lb 12.8 oz (89.7 kg)    Physical Exam Vitals and nursing note reviewed.  Constitutional:      General: She is awake. She is not in acute distress.    Appearance: Normal appearance. She is well-developed. She is obese. She is not ill-appearing.  HENT:     Head: Normocephalic and atraumatic.     Right Ear: Hearing, tympanic membrane, ear canal and external ear normal. No drainage.     Left Ear: Hearing, tympanic membrane, ear canal and external ear normal. No drainage.     Nose: Nose normal.     Right Sinus: No maxillary sinus tenderness or frontal sinus tenderness.     Left Sinus: No maxillary sinus tenderness or frontal sinus tenderness.     Mouth/Throat:     Mouth: Mucous membranes are moist.     Pharynx: Oropharynx is clear. Uvula midline. No pharyngeal swelling, oropharyngeal exudate or posterior oropharyngeal erythema.  Eyes:     General: Lids are normal.        Right eye: No discharge.        Left eye: No discharge.     Extraocular Movements: Extraocular movements intact.     Conjunctiva/sclera: Conjunctivae normal.     Pupils: Pupils are equal, round, and reactive to light.     Visual Fields: Right eye visual fields normal and left eye visual fields normal.  Neck:     Thyroid: No thyromegaly.     Vascular: No carotid bruit.     Trachea: Trachea normal.  Cardiovascular:     Rate and Rhythm: Normal rate and regular rhythm.     Heart sounds: Normal heart sounds. No murmur heard.    No gallop.  Pulmonary:     Effort: Pulmonary effort is normal. No accessory muscle usage or respiratory distress.     Breath sounds: Normal breath sounds.  Chest:  Breasts:    Right: Normal.     Left: Normal.  Abdominal:     General: Bowel sounds are normal.     Palpations: Abdomen is soft. There is no hepatomegaly or splenomegaly.     Tenderness: There is no abdominal tenderness.   Musculoskeletal:        General: Normal range of motion.     Cervical back: Normal range of motion and neck supple.     Right lower leg: No edema.     Left lower leg: No edema.  Lymphadenopathy:     Head:     Right side of head: No submental, submandibular, tonsillar, preauricular or posterior auricular adenopathy.     Left side of head: No submental, submandibular, tonsillar, preauricular or posterior auricular adenopathy.     Cervical: No cervical adenopathy.     Upper Body:     Right upper body: No supraclavicular, axillary or pectoral adenopathy.     Left upper body: No supraclavicular, axillary or pectoral adenopathy.  Skin:    General: Skin is warm and dry.     Capillary Refill: Capillary refill takes less than 2 seconds.     Findings: No rash.  Neurological:     Mental Status: She is alert and oriented to person, place, and time.     Gait: Gait is intact.     Deep Tendon Reflexes: Reflexes are normal and symmetric.     Reflex Scores:      Brachioradialis reflexes are 2+ on the right side and 2+ on the left side.  Patellar reflexes are 2+ on the right side and 2+ on the left side. Psychiatric:        Attention and Perception: Attention normal.        Mood and Affect: Mood normal.        Speech: Speech normal.        Behavior: Behavior normal. Behavior is cooperative.        Thought Content: Thought content normal.        Judgment: Judgment normal.     Results for orders placed or performed in visit on 02/14/21  Comp Met (CMET)  Result Value Ref Range   Glucose 78 70 - 99 mg/dL   BUN 13 8 - 27 mg/dL   Creatinine, Ser 0.68 0.57 - 1.00 mg/dL   eGFR 98 >59 mL/min/1.73   BUN/Creatinine Ratio 19 12 - 28   Sodium 142 134 - 144 mmol/L   Potassium 4.4 3.5 - 5.2 mmol/L   Chloride 101 96 - 106 mmol/L   CO2 27 20 - 29 mmol/L   Calcium 9.6 8.7 - 10.3 mg/dL   Total Protein 7.1 6.0 - 8.5 g/dL   Albumin 4.7 3.8 - 4.8 g/dL   Globulin, Total 2.4 1.5 - 4.5 g/dL    Albumin/Globulin Ratio 2.0 1.2 - 2.2   Bilirubin Total <0.2 0.0 - 1.2 mg/dL   Alkaline Phosphatase 86 44 - 121 IU/L   AST 19 0 - 40 IU/L   ALT 16 0 - 32 IU/L  Lipid Profile  Result Value Ref Range   Cholesterol, Total 153 100 - 199 mg/dL   Triglycerides 129 0 - 149 mg/dL   HDL 44 >39 mg/dL   VLDL Cholesterol Cal 23 5 - 40 mg/dL   LDL Chol Calc (NIH) 86 0 - 99 mg/dL   Chol/HDL Ratio 3.5 0.0 - 4.4 ratio  HgB A1c  Result Value Ref Range   Hgb A1c MFr Bld 6.1 (H) 4.8 - 5.6 %   Est. average glucose Bld gHb Est-mCnc 128 mg/dL      Assessment & Plan:   Problem List Items Addressed This Visit       Other   Anxiety    Chronic.  Controlled.  Continue with current medication regimen on Effexor 183m daily.  Refill sent today.  Labs ordered today.  Return to clinic in 6 months for reevaluation.  Call sooner if concerns arise.        Relevant Medications   venlafaxine XR (EFFEXOR-XR) 150 MG 24 hr capsule   Depression    Chronic.  Controlled.  Continue with current medication regimen on Effexor 1573mdaily.  Refill sent today.  Labs ordered today.  Return to clinic in 6 months for reevaluation.  Call sooner if concerns arise.        Relevant Medications   venlafaxine XR (EFFEXOR-XR) 150 MG 24 hr capsule   Hyperlipidemia    Chronic.  Controlled.  Continue with current medication regimen of Crestor 4029m Labs ordered today.  Return to clinic in 6 months for reevaluation.  Call sooner if concerns arise.        Relevant Medications   rosuvastatin (CRESTOR) 40 MG tablet   Other Relevant Orders   Lipid panel   Pre-diabetes    Labs ordered today.  Will make recommendations based on lab results.      Relevant Orders   HgB A1c   Other Visit Diagnoses     Annual physical exam    -  Primary  Health maintenance reviewed during visit today. Labs ordered. Mammogram and Colonoscopy ordered.    Relevant Orders   CBC with Differential/Platelet   Comprehensive metabolic panel   Lipid  panel   TSH   Urinalysis, Routine w reflex microscopic   Encounter for screening mammogram for malignant neoplasm of breast       Relevant Orders   MM 3D SCREEN BREAST BILATERAL   Screening for colon cancer       Relevant Orders   Ambulatory referral to Gastroenterology        Follow up plan: Return in about 6 months (around 03/03/2022) for HTN, HLD, DM2 FU.   LABORATORY TESTING:  - Pap smear: up to date  IMMUNIZATIONS:   - Tdap: Tetanus vaccination status reviewed: last tetanus booster within 10 years. - Influenza: Postponed to flu season - Pneumovax: Not applicable - Prevnar: Not applicable - COVID: Not applicable - HPV: Not applicable - Shingrix vaccine: Up to date  SCREENING: -Mammogram: Up to date  - Colonoscopy: Ordered today  - Bone Density: Not applicable  -Hearing Test: Not applicable  -Spirometry: Not applicable   PATIENT COUNSELING:   Advised to take 1 mg of folate supplement per day if capable of pregnancy.   Sexuality: Discussed sexually transmitted diseases, partner selection, use of condoms, avoidance of unintended pregnancy  and contraceptive alternatives.   Advised to avoid cigarette smoking.  I discussed with the patient that most people either abstain from alcohol or drink within safe limits (<=14/week and <=4 drinks/occasion for males, <=7/weeks and <= 3 drinks/occasion for females) and that the risk for alcohol disorders and other health effects rises proportionally with the number of drinks per week and how often a drinker exceeds daily limits.  Discussed cessation/primary prevention of drug use and availability of treatment for abuse.   Diet: Encouraged to adjust caloric intake to maintain  or achieve ideal body weight, to reduce intake of dietary saturated fat and total fat, to limit sodium intake by avoiding high sodium foods and not adding table salt, and to maintain adequate dietary potassium and calcium preferably from fresh fruits,  vegetables, and low-fat dairy products.    stressed the importance of regular exercise  Injury prevention: Discussed safety belts, safety helmets, smoke detector, smoking near bedding or upholstery.   Dental health: Discussed importance of regular tooth brushing, flossing, and dental visits.    NEXT PREVENTATIVE PHYSICAL DUE IN 1 YEAR. Return in about 6 months (around 03/03/2022) for HTN, HLD, DM2 FU.

## 2021-09-01 LAB — COMPREHENSIVE METABOLIC PANEL
ALT: 15 IU/L (ref 0–32)
AST: 20 IU/L (ref 0–40)
Albumin/Globulin Ratio: 1.9 (ref 1.2–2.2)
Albumin: 4.6 g/dL (ref 3.9–4.9)
Alkaline Phosphatase: 72 IU/L (ref 44–121)
BUN/Creatinine Ratio: 21 (ref 12–28)
BUN: 19 mg/dL (ref 8–27)
Bilirubin Total: 0.3 mg/dL (ref 0.0–1.2)
CO2: 24 mmol/L (ref 20–29)
Calcium: 9.5 mg/dL (ref 8.7–10.3)
Chloride: 102 mmol/L (ref 96–106)
Creatinine, Ser: 0.92 mg/dL (ref 0.57–1.00)
Globulin, Total: 2.4 g/dL (ref 1.5–4.5)
Glucose: 80 mg/dL (ref 70–99)
Potassium: 4.2 mmol/L (ref 3.5–5.2)
Sodium: 142 mmol/L (ref 134–144)
Total Protein: 7 g/dL (ref 6.0–8.5)
eGFR: 70 mL/min/{1.73_m2} (ref >59)

## 2021-09-01 LAB — CBC WITH DIFFERENTIAL/PLATELET
Basophils Absolute: 0 10*3/uL (ref 0.0–0.2)
Basos: 1 %
EOS (ABSOLUTE): 0.1 10*3/uL (ref 0.0–0.4)
Eos: 1 %
Hematocrit: 35.8 % (ref 34.0–46.6)
Hemoglobin: 12.1 g/dL (ref 11.1–15.9)
Immature Grans (Abs): 0 10*3/uL (ref 0.0–0.1)
Immature Granulocytes: 0 %
Lymphocytes Absolute: 1.8 10*3/uL (ref 0.7–3.1)
Lymphs: 31 %
MCH: 30.1 pg (ref 26.6–33.0)
MCHC: 33.8 g/dL (ref 31.5–35.7)
MCV: 89 fL (ref 79–97)
Monocytes Absolute: 0.3 10*3/uL (ref 0.1–0.9)
Monocytes: 6 %
Neutrophils Absolute: 3.5 10*3/uL (ref 1.4–7.0)
Neutrophils: 61 %
Platelets: 286 10*3/uL (ref 150–450)
RBC: 4.02 x10E6/uL (ref 3.77–5.28)
RDW: 12.4 % (ref 11.7–15.4)
WBC: 5.7 10*3/uL (ref 3.4–10.8)

## 2021-09-01 LAB — LIPID PANEL
Chol/HDL Ratio: 3.1 ratio (ref 0.0–4.4)
Cholesterol, Total: 154 mg/dL (ref 100–199)
HDL: 50 mg/dL (ref >39)
LDL Chol Calc (NIH): 81 mg/dL (ref 0–99)
Triglycerides: 130 mg/dL (ref 0–149)
VLDL Cholesterol Cal: 23 mg/dL (ref 5–40)

## 2021-09-01 LAB — TSH: TSH: 2.06 u[IU]/mL (ref 0.450–4.500)

## 2021-09-01 LAB — HEMOGLOBIN A1C
Est. average glucose Bld gHb Est-mCnc: 120 mg/dL
Hgb A1c MFr Bld: 5.8 % — ABNORMAL HIGH (ref 4.8–5.6)

## 2021-09-01 NOTE — Progress Notes (Signed)
Please let patient know that her lab work looks great.  A1c is well controlled at 5.8 which is in the prediabetic range.  Cholesterol is well controlled.  Liver, kidneys, and electrolytes look good.  No other concerns at this time.  Follow up as discussed.

## 2021-09-05 ENCOUNTER — Telehealth: Payer: Self-pay

## 2021-09-05 NOTE — Telephone Encounter (Signed)
LVMTRC 

## 2021-09-05 NOTE — Telephone Encounter (Signed)
That is not something we do here.  I don't know of anyone I can refer her to to help with this.

## 2021-09-05 NOTE — Telephone Encounter (Signed)
Copied from CRM [id number]. Topic: General - Other >> Sep 02, 2021  4:09 PM Turkey B wrote: Reason for CRM: Patient called in states trying to get permission for her dog to be her service dog. Please call back   We do not do this correct? Patient would need to be referred correct?

## 2021-09-08 ENCOUNTER — Telehealth: Payer: Self-pay

## 2021-09-08 ENCOUNTER — Other Ambulatory Visit: Payer: Self-pay

## 2021-09-08 DIAGNOSIS — Z1211 Encounter for screening for malignant neoplasm of colon: Secondary | ICD-10-CM

## 2021-09-08 NOTE — Telephone Encounter (Signed)
Gastroenterology Pre-Procedure Review  Request Date: 10/13/21 Requesting Physician: Dr. Tobi Bastos  PATIENT REVIEW QUESTIONS: The patient responded to the following health history questions as indicated:    1. Are you having any GI issues? yes (Constipation to diarrhea, lactose intolerant) 2. Do you have a personal history of Polyps? No 3. Do you have a family history of Colon Cancer or Polyps? yes (twin sister polyps) 4. Diabetes Mellitus? Type 2 5. Joint replacements in the past 12 months? September 2022 Left knee surge ry 6. Major health problems in the past 3 months?no 7. Any artificial heart valves, MVP, or defibrillator?no    MEDICATIONS & ALLERGIES:    Patient reports the following regarding taking any anticoagulation/antiplatelet therapy:   Plavix, Coumadin, Eliquis, Xarelto, Lovenox, Pradaxa, Brilinta, or Effient? no Aspirin? yes (81 mg daily)  Patient confirms/reports the following medications:  Current Outpatient Medications  Medication Sig Dispense Refill   acetaminophen (TYLENOL) 500 MG tablet Take 2 tablets (1,000 mg total) by mouth every 6 (six) hours as needed. 30 tablet 0   aspirin 81 MG EC tablet Take 81 mg by mouth daily. Swallow whole.     Homeopathic Products (ARNICARE) GEL Apply topically as needed.     Lidocaine HCl (ASPERCREME LIDOCAINE) 4 % LIQD Apply topically as needed.     Multiple Vitamins-Minerals (ADULT GUMMY PO) Take 2 capsules by mouth daily.     rOPINIRole (REQUIP) 0.5 MG tablet Take 1 tablet (0.5 mg total) by mouth at bedtime. 90 tablet 1   rosuvastatin (CRESTOR) 40 MG tablet Take 1 tablet (40 mg total) by mouth daily. 90 tablet 1   venlafaxine XR (EFFEXOR-XR) 150 MG 24 hr capsule Take 1 capsule (150 mg total) by mouth daily with breakfast. 90 capsule 1   No current facility-administered medications for this visit.    Patient confirms/reports the following allergies:  Allergies  Allergen Reactions   Lactose Diarrhea    Bloating, gas    Lactose  Intolerance (Gi) Diarrhea    Bloating, gas     No orders of the defined types were placed in this encounter.   AUTHORIZATION INFORMATION Primary Insurance: 1D#: Group #:  Secondary Insurance: 1D#: Group #:  SCHEDULE INFORMATION: Date:  Time: Location:

## 2021-09-15 ENCOUNTER — Telehealth: Payer: Self-pay

## 2021-09-15 ENCOUNTER — Telehealth: Payer: Self-pay | Admitting: Nurse Practitioner

## 2021-09-15 NOTE — Telephone Encounter (Signed)
Attempted to call patient to inquire is she needs refill on rosuvastatin. Recent fill on 08/31/21 #90 she is to take 1 tablet daily. She should not need a refill just yet but calling to confirm. LVMTRC

## 2021-09-15 NOTE — Telephone Encounter (Signed)
Hi Patient is calling to ask for the script to be sent for drink to be sent prior to her colonoscopy. Please advise [phone]

## 2021-09-16 NOTE — Telephone Encounter (Signed)
Called patient and let her know that the facility that is performing her colonoscopy will provide what she needs before she has the colonoscopy done. Patient verbalized understanding and does not have any further questions at this time.

## 2021-09-19 ENCOUNTER — Other Ambulatory Visit: Payer: Self-pay | Admitting: Nurse Practitioner

## 2021-09-19 MED ORDER — ROSUVASTATIN CALCIUM 40 MG PO TABS: 40.0000 mg | ORAL_TABLET | Freq: Every day | ORAL | 3 refills | Status: DC

## 2021-09-19 NOTE — Telephone Encounter (Signed)
[facility] reports that they have requested Rx transfer but is not getting a response.   Medication Refill - Medication: rosuvastatin (CRESTOR) 40 MG tablet  Has the patient contacted their pharmacy? Yes.    Preferred Pharmacy (with phone number or street name):  [facility] - [place], [place] - [address] Phone:  [phone]  Fax:  [phone]     Has the patient been seen for an appointment in the last year OR does the patient have an upcoming appointment? Yes.    Agent: Please be advised that RX refills may take up to 3 business days. We ask that you follow-up with your pharmacy.

## 2021-09-19 NOTE — Telephone Encounter (Signed)
Requested Prescriptions  Pending Prescriptions Disp Refills  . rosuvastatin (CRESTOR) 40 MG tablet 90 tablet 3    Sig: Take 1 tablet (40 mg total) by mouth daily.     Cardiovascular:  Antilipid - Statins 2 Failed - 09/19/2021 12:31 PM      Failed - Lipid Panel in normal range within the last 12 months    Cholesterol, Total  Date Value Ref Range Status  08/31/2021 154 100 - 199 mg/dL Final   LDL Chol Calc (NIH)  Date Value Ref Range Status  08/31/2021 81 0 - 99 mg/dL Final   HDL  Date Value Ref Range Status  08/31/2021 50 >39 mg/dL Final   Triglycerides  Date Value Ref Range Status  08/31/2021 130 0 - 149 mg/dL Final         Passed - Cr in normal range and within 360 days    Creatinine, Ser  Date Value Ref Range Status  08/31/2021 0.92 0.57 - 1.00 mg/dL Final         Passed - Patient is not pregnant      Passed - Valid encounter within last 12 months    Recent Outpatient Visits          2 weeks ago Annual physical exam   [facility] Larae Grooms, NP   3 months ago Urinary urgency   [facility] Larae Grooms, NP   4 months ago Urinary urgency   [facility] Larae Grooms, NP   7 months ago Anxiety   [facility] Larae Grooms, NP   11 months ago Anxiety   [facility] Larae Grooms, NP      Future Appointments            In 5 months Larae Grooms, NP [facility]

## 2021-09-30 ENCOUNTER — Telehealth: Payer: Self-pay

## 2021-09-30 NOTE — Telephone Encounter (Signed)
Called and LVM asking for patient to please return my call. Need to find out if patient would like to have her mammogram ordered and scheduled. The last one I have was from 2021 at [facility]. Patient can either go to [facility] or [place] to have this done If she would like.   OK for [facility] to find out the above information if the patient calls back.

## 2021-10-04 ENCOUNTER — Other Ambulatory Visit: Payer: Self-pay

## 2021-10-04 ENCOUNTER — Telehealth: Payer: Self-pay | Admitting: Gastroenterology

## 2021-10-04 MED ORDER — ROPINIROLE HCL 0.5 MG PO TABS: 0.5000 mg | ORAL_TABLET | Freq: Every day | ORAL | 1 refills | Status: DC

## 2021-10-04 NOTE — Telephone Encounter (Signed)
Called and LVM asking for patient to please return my call. Mammogram has been scheduled for 10/24/21 at 3:40 pm at [facility] in [place]. Their phone number is [phone].   OK for [facility] to give the patient the appointment information and phone number if she calls back.

## 2021-10-04 NOTE — Telephone Encounter (Signed)
Copied from CRM [id number]. Topic: General - Other >> Oct 03, 2021 12:37 PM Ja-Kwan M wrote: Reason for CRM: Pt returned call and stated she would like for the mammogram to be ordered and the appt scheduled.

## 2021-10-04 NOTE — Telephone Encounter (Signed)
Patient calling to go over medications and instructions for colonoscopy.

## 2021-10-04 NOTE — Telephone Encounter (Signed)
[facility] faxed med refill request for Ropinirole 0.5 mg #90 1 RF. Last seen on 08/31/2021, upcoming appt for 03/03/2022. Please advise.

## 2021-10-05 ENCOUNTER — Telehealth: Payer: Self-pay

## 2021-10-05 NOTE — Telephone Encounter (Signed)
Returned patients phone call.  LVM asking her to call office back.  Thanks, [place], [place]

## 2021-10-06 ENCOUNTER — Other Ambulatory Visit: Payer: Self-pay | Admitting: Nurse Practitioner

## 2021-10-06 NOTE — Telephone Encounter (Signed)
Please send rx for OPINIRole (REQUIP) 0.5 MG tablet to  [facility] - [place], [place] - [address] Phone:  [phone]  Fax:  [phone]     Rx was sent to [facility] on 10/04/21.

## 2021-10-07 MED ORDER — ROPINIROLE HCL 0.5 MG PO TABS: 0.5000 mg | ORAL_TABLET | Freq: Every day | ORAL | 1 refills | Status: DC

## 2021-10-07 NOTE — Telephone Encounter (Signed)
Change of pharm per pt. Requested Prescriptions  Pending Prescriptions Disp Refills  . rOPINIRole (REQUIP) 0.5 MG tablet 90 tablet 1    Sig: Take 1 tablet (0.5 mg total) by mouth at bedtime.     Neurology:  Parkinsonian Agents Passed - 10/06/2021  1:59 PM      Passed - Last BP in normal range    BP Readings from Last 1 Encounters:  08/31/21 122/81         Passed - Last Heart Rate in normal range    Pulse Readings from Last 1 Encounters:  08/31/21 60         Passed - Valid encounter within last 12 months    Recent Outpatient Visits          1 month ago Annual physical exam   [facility] Larae Grooms, NP   4 months ago Urinary urgency   [facility] Larae Grooms, NP   5 months ago Urinary urgency   [facility] Larae Grooms, NP   7 months ago Anxiety   [facility] Larae Grooms, NP   11 months ago Anxiety   [facility] Larae Grooms, NP      Future Appointments            In 4 months Larae Grooms, NP [facility]

## 2021-10-13 ENCOUNTER — Ambulatory Visit: Payer: Medicare HMO

## 2021-10-13 ENCOUNTER — Encounter: Payer: Self-pay | Admitting: Gastroenterology

## 2021-10-13 ENCOUNTER — Encounter
Admission: RE | Disposition: A | Discharge status: Home / Self Care | Payer: Self-pay | Source: Ambulatory Visit | Attending: Gastroenterology

## 2021-10-13 ENCOUNTER — Ambulatory Visit
Admission: RE | Admit: 2021-10-13 | Discharge: 2021-10-13 | Disposition: A | Discharge status: Home / Self Care | Payer: Medicare HMO | Source: Ambulatory Visit | Attending: Gastroenterology | Admitting: Gastroenterology

## 2021-10-13 DIAGNOSIS — M1990 Unspecified osteoarthritis, unspecified site: Secondary | ICD-10-CM | POA: Diagnosis not present

## 2021-10-13 DIAGNOSIS — I2510 Atherosclerotic heart disease of native coronary artery without angina pectoris: Secondary | ICD-10-CM | POA: Diagnosis not present

## 2021-10-13 DIAGNOSIS — E785 Hyperlipidemia, unspecified: Secondary | ICD-10-CM | POA: Diagnosis not present

## 2021-10-13 DIAGNOSIS — R7303 Prediabetes: Secondary | ICD-10-CM | POA: Diagnosis not present

## 2021-10-13 DIAGNOSIS — Z1211 Encounter for screening for malignant neoplasm of colon: Secondary | ICD-10-CM | POA: Insufficient documentation

## 2021-10-13 DIAGNOSIS — F419 Anxiety disorder, unspecified: Secondary | ICD-10-CM | POA: Diagnosis not present

## 2021-10-13 DIAGNOSIS — F32A Depression, unspecified: Secondary | ICD-10-CM | POA: Diagnosis not present

## 2021-10-13 DIAGNOSIS — I10 Essential (primary) hypertension: Secondary | ICD-10-CM | POA: Insufficient documentation

## 2021-10-13 DIAGNOSIS — Z87891 Personal history of nicotine dependence: Secondary | ICD-10-CM | POA: Insufficient documentation

## 2021-10-13 HISTORY — PX: COLONOSCOPY WITH PROPOFOL: SHX5780

## 2021-10-13 LAB — GLUCOSE, CAPILLARY: Glucose-Capillary: 89 mg/dL (ref 70–99)

## 2021-10-13 SURGERY — COLONOSCOPY WITH PROPOFOL
Anesthesia: General

## 2021-10-13 MED ORDER — LIDOCAINE HCL (PF) 2 % IJ SOLN
INTRAMUSCULAR | Status: AC
  Filled 2021-10-13: qty 5

## 2021-10-13 MED ORDER — LIDOCAINE HCL (CARDIAC) PF 100 MG/5ML IV SOSY
PREFILLED_SYRINGE | INTRAVENOUS | Status: DC | PRN
  Administered 2021-10-13: 60 mg via INTRAVENOUS

## 2021-10-13 MED ORDER — PROPOFOL 500 MG/50ML IV EMUL
INTRAVENOUS | Status: DC | PRN
  Administered 2021-10-13: 140 ug/kg/min via INTRAVENOUS

## 2021-10-13 MED ORDER — PROPOFOL 10 MG/ML IV BOLUS
INTRAVENOUS | Status: AC
  Filled 2021-10-13: qty 20

## 2021-10-13 MED ORDER — DEXMEDETOMIDINE (PRECEDEX) IN NS 20 MCG/5ML (4 MCG/ML) IV SYRINGE
PREFILLED_SYRINGE | INTRAVENOUS | Status: DC | PRN
  Administered 2021-10-13: 8 ug via INTRAVENOUS

## 2021-10-13 MED ORDER — PROPOFOL 10 MG/ML IV BOLUS
INTRAVENOUS | Status: DC | PRN
  Administered 2021-10-13: 70 mg via INTRAVENOUS

## 2021-10-13 MED ORDER — SODIUM CHLORIDE 0.9 % IV SOLN: INTRAVENOUS | Status: DC

## 2021-10-13 NOTE — H&P (Signed)
Wyline Mood, MD [address], [address], [place], [place], [address] [address], [place], [place], [address] Phone: [phone]  Fax: [phone]  Primary Care Physician:  Larae Grooms, NP   Pre-Procedure History & Physical: HPI:  Jill Burch is a 61 y.o. female is here for an colonoscopy.   Past Medical History:  Diagnosis Date   Anxiety    Depression    Hyperlipidemia    Osteoarthritis    PONV (postoperative nausea and vomiting)    Pre-diabetes     Past Surgical History:  Procedure Laterality Date   APPLICATION OF WOUND VAC  10/14/2020   Procedure: APPLICATION OF WOUND VAC;  Surgeon: Christena Flake, MD;  Location: [facility] ORS;  Service: Orthopedics;;   TOTAL KNEE ARTHROPLASTY Left 10/14/2020   Procedure: TOTAL KNEE ARTHROPLASTY;  Surgeon: Christena Flake, MD;  Location: [facility] ORS;  Service: Orthopedics;  Laterality: Left;   TUBAL LIGATION  1995    Prior to Admission medications   Medication Sig Start Date End Date Taking? Authorizing Provider  acetaminophen (TYLENOL) 500 MG tablet Take 2 tablets (1,000 mg total) by mouth every 6 (six) hours as needed. 10/18/20  Yes Anson [place], PA-C  aspirin 81 MG EC tablet Take 81 mg by mouth daily. Swallow whole.   Yes [provider]  rOPINIRole (REQUIP) 0.5 MG tablet Take 1 tablet (0.5 mg total) by mouth at bedtime. 10/07/21  Yes Larae Grooms, NP  rosuvastatin (CRESTOR) 40 MG tablet Take 1 tablet (40 mg total) by mouth daily. 09/19/21  Yes Larae Grooms, NP  venlafaxine XR (EFFEXOR-XR) 150 MG 24 hr capsule Take 1 capsule (150 mg total) by mouth daily with breakfast. 08/31/21  Yes Larae Grooms, NP  Homeopathic Products (ARNICARE) GEL Apply topically as needed.    [provider]  Lidocaine HCl (ASPERCREME LIDOCAINE) 4 % LIQD Apply topically as needed.    [provider]  Multiple Vitamins-Minerals (ADULT GUMMY PO) Take 2 capsules by mouth daily.    [provider]     Allergies as of 09/09/2021 - Review Complete 08/31/2021  Allergen Reaction Noted   Lactose Diarrhea 09/30/2020   Lactose intolerance (gi) Diarrhea 09/30/2020    Family History  Problem Relation Age of Onset   Depression Mother    Arthritis Mother    Heart disease Mother    Heart attack Mother    Cancer Father        bladder   Diabetes Father    Depression Father    Hypertension Father    Diabetes Brother    COPD Neg Hx    Stroke Neg Hx     Social History   Socioeconomic History   Marital status: Married    Spouse name: Dorene Sorrow   Number of children: 1   Years of education: Not on file   Highest education level: Not on file  Occupational History   Not on file  Tobacco Use   Smoking status: Former    Types: Cigarettes    Quit date: 02/06/1990    Years since quitting: 31.7   Smokeless tobacco: Never  Vaping Use   Vaping Use: Never used  Substance and Sexual Activity   Alcohol use: No    Alcohol/week: 0.0 standard drinks of alcohol   Drug use: No   Sexual activity: Not Currently  Other Topics Concern   Not on file  Social History Narrative   Live at home with husband.   Social Determinants  of Health   Financial Resource Strain: Not on file  Food Insecurity: Not on file  Transportation Needs: Not on file  Physical Activity: Not on file  Stress: Not on file  Social Connections: Not on file  Intimate Partner Violence: Not on file    Review of Systems: See HPI, otherwise negative ROS  Physical Exam: BP 116/77   Pulse 76   Temp (!) 96.2 F (35.7 C) (Temporal)   Resp 18   Ht 5\' 2"  (1.575 m)   Wt 89.4 kg   SpO2 97%   BMI 36.03 kg/m  General:   Alert,  pleasant and cooperative in NAD Head:  Normocephalic and atraumatic. Neck:  Supple; no masses or thyromegaly. Lungs:  Clear throughout to auscultation, normal respiratory effort.    Heart:  +S1, +S2, Regular rate and rhythm, No edema. Abdomen:  Soft, nontender and nondistended. Normal bowel sounds,  without guarding, and without rebound.   Neurologic:  Alert and  oriented x4;  grossly normal neurologically.  Impression/Plan: CHARMAIN DIOSDADO is here for an colonoscopy to be performed for Screening colonoscopy average risk   Risks, benefits, limitations, and alternatives regarding  colonoscopy have been reviewed with the patient.  Questions have been answered.  All parties agreeable.   [name], MD  10/13/2021, 8:31 AM

## 2021-10-13 NOTE — Transfer of Care (Signed)
Immediate Anesthesia Transfer of Care Note  Patient: Jill Burch  Procedure(s) Performed: COLONOSCOPY WITH PROPOFOL  Patient Location: PACU  Anesthesia Type:General  Level of Consciousness: drowsy  Airway & Oxygen Therapy: Patient Spontanous Breathing  Post-op Assessment: Report given to RN and Post -op Vital signs reviewed and stable  Post vital signs: Reviewed and stable  Last Vitals:  Vitals Value Taken Time  BP 96/63 10/13/21 0846  Temp    Pulse 67 10/13/21 0846  Resp 22 10/13/21 0846  SpO2 96 % 10/13/21 0846  Vitals shown include unvalidated device data.  Last Pain:  Vitals:   10/13/21 0801  TempSrc: Temporal  PainSc: 0-No pain         Complications: No notable events documented.

## 2021-10-13 NOTE — Op Note (Signed)
[facility] Patient Name: Jill Burch Procedure Date: 10/13/2021 8:33 AM MRN: [id number] Account #: [id number] Date of Birth: Nov 10, 1960 Admit Type: Outpatient Age: 61 Room: [facility] ENDO ROOM 3 Gender: Female Note Status: Finalized Instrument Name: Prentice Docker [id number] Procedure:             Colonoscopy Indications:           Screening for colorectal malignant neoplasm Providers:             Wyline Mood MD, MD Referring MD:          No Local Md, MD (Referring MD) Medicines:             Monitored Anesthesia Care Complications:         No immediate complications. Procedure:             Pre-Anesthesia Assessment:                        - Prior to the procedure, a History and Physical was                         performed, and patient medications, allergies and                         sensitivities were reviewed. The patient's tolerance                         of previous anesthesia was reviewed.                        - The risks and benefits of the procedure and the                         sedation options and risks were discussed with the                         patient. All questions were answered and informed                         consent was obtained.                        - ASA Grade Assessment: II - A patient with mild                         systemic disease.                        After obtaining informed consent, the colonoscope was                         passed under direct vision. Throughout the procedure,                         the patient's blood pressure, pulse, and oxygen                         saturations were monitored continuously. The                         Colonoscope was  introduced through the anus with the                         intention of advancing to the cecum. The scope was                         advanced to the sigmoid colon before the procedure was                         aborted. Medications were given. The  colonoscopy was                         performed with ease. The patient tolerated the                         procedure well. The quality of the bowel preparation                         was inadequate. Findings:      A large amount of stool was found in the rectum and in the sigmoid       colon, interfering with visualization. Impression:            - Preparation of the colon was inadequate.                        - Stool in the rectum and in the sigmoid colon.                        - No specimens collected. Recommendation:        - Discharge patient to home (with escort).                        - Resume previous diet.                        - Continue present medications.                        - Repeat colonoscopy tomorrow because the bowel                         preparation was suboptimal. Procedure Code(s):     --- Professional ---                        [phone], 53, Colonoscopy, flexible; diagnostic,                         including collection of specimen(s) by brushing or                         washing, when performed (separate procedure) Diagnosis Code(s):     --- Professional ---                        Z12.11, Encounter for screening for malignant neoplasm                         of colon CPT copyright 2019 American Medical Association. All rights reserved. The  codes documented in this report are preliminary and upon coder review may  be revised to meet current compliance requirements. Wyline Mood, MD Wyline Mood MD, MD 10/13/2021 8:43:26 AM This report has been signed electronically. Number of Addenda: 0 Note Initiated On: 10/13/2021 8:33 AM Total Procedure Duration: 0 hours 1 minute 3 seconds  Estimated Blood Loss:  Estimated blood loss: none.      [facility]

## 2021-10-13 NOTE — Anesthesia Preprocedure Evaluation (Signed)
Anesthesia Evaluation  Patient identified by MRN, date of birth, ID band Patient awake    Reviewed: Allergy & Precautions, NPO status , Patient's Chart, lab work & pertinent test results  History of Anesthesia Complications Negative for: history of anesthetic complications  Airway Mallampati: III  TM Distance: >3 FB Neck ROM: full    Dental  (+) Dental Advidsory Given, Missing, Poor Dentition   Pulmonary neg pulmonary ROS, neg shortness of breath, neg recent URI, former smoker,    Pulmonary exam normal        Cardiovascular (-) hypertension+ CAD  Normal cardiovascular exam     Neuro/Psych negative neurological ROS  negative psych ROS   GI/Hepatic negative GI ROS, Neg liver ROS,   Endo/Other  negative endocrine ROS  Renal/GU negative Renal ROS  negative genitourinary   Musculoskeletal   Abdominal   Peds  Hematology negative hematology ROS (+)   Anesthesia Other Findings Past Medical History: No date: Anxiety No date: Depression No date: Hyperlipidemia No date: Osteoarthritis No date: PONV (postoperative nausea and vomiting) No date: Pre-diabetes  Past Surgical History: 10/14/2020: APPLICATION OF WOUND VAC     Comment:  Procedure: APPLICATION OF WOUND VAC;  Surgeon: Christena Flake, MD;  Location: [facility] ORS;  Service: Orthopedics;; 10/14/2020: TOTAL KNEE ARTHROPLASTY; Left     Comment:  Procedure: TOTAL KNEE ARTHROPLASTY;  Surgeon: Christena Flake, MD;  Location: [facility] ORS;  Service: Orthopedics;                Laterality: Left; 1995: TUBAL LIGATION  BMI    Body Mass Index: 36.03 kg/m      Reproductive/Obstetrics negative OB ROS                             Anesthesia Physical Anesthesia Plan  ASA: 2  Anesthesia Plan: General   Post-op Pain Management: Minimal or no pain anticipated   Induction: Intravenous  PONV Risk Score and Plan: 3 and Propofol  infusion, TIVA and Ondansetron  Airway Management Planned: Nasal Cannula  Additional Equipment: None  Intra-op Plan:   Post-operative Plan:   Informed Consent: I have reviewed the patients History and Physical, chart, labs and discussed the procedure including the risks, benefits and alternatives for the proposed anesthesia with the patient or authorized representative who has indicated his/her understanding and acceptance.     Dental advisory given  Plan Discussed with: CRNA and Surgeon  Anesthesia Plan Comments: (Discussed risks of anesthesia with patient, including possibility of difficulty with spontaneous ventilation under anesthesia necessitating airway intervention, PONV, and rare risks such as cardiac or respiratory or neurological events, and allergic reactions. Discussed the role of CRNA in patient's perioperative care. Patient understands.)        Anesthesia Quick Evaluation

## 2021-10-13 NOTE — Anesthesia Postprocedure Evaluation (Signed)
Anesthesia Post Note  Patient: Jill Burch  Procedure(s) Performed: COLONOSCOPY WITH PROPOFOL  Patient location during evaluation: Endoscopy Anesthesia Type: General Level of consciousness: awake and alert Pain management: pain level controlled Vital Signs Assessment: post-procedure vital signs reviewed and stable Respiratory status: spontaneous breathing, nonlabored ventilation, respiratory function stable and patient connected to nasal cannula oxygen Cardiovascular status: blood pressure returned to baseline and stable Postop Assessment: no apparent nausea or vomiting Anesthetic complications: no   No notable events documented.   Last Vitals:  Vitals:   10/13/21 0801 10/13/21 0847  BP: 116/77   Pulse: 76   Resp: 18   Temp: (!) 35.7 C (!) 36.1 C  SpO2: 97%     Last Pain:  Vitals:   10/13/21 0907  TempSrc:   PainSc: 0-No pain                 Stephanie Coup

## 2021-10-14 ENCOUNTER — Encounter: Payer: Self-pay | Admitting: Gastroenterology

## 2021-10-16 ENCOUNTER — Other Ambulatory Visit: Payer: Self-pay | Admitting: Nurse Practitioner

## 2021-10-18 NOTE — Telephone Encounter (Signed)
rx was sent to pharmacy 10/07/21 #90/1, E-Prescribing Status: Receipt confirmed by pharmacy (10/07/2021 8:54 AM EDT)  Requested Prescriptions  Pending Prescriptions Disp Refills  . rOPINIRole (REQUIP) 0.25 MG tablet [Pharmacy Med Name: ROPINIROLE HYDROCHLORIDE 0.25 MG Tablet] 90 tablet     Sig: TAKE 1 TABLET AT BEDTIME     Neurology:  Parkinsonian Agents Passed - 10/16/2021  4:20 AM      Passed - Last BP in normal range    BP Readings from Last 1 Encounters:  10/13/21 116/77         Passed - Last Heart Rate in normal range    Pulse Readings from Last 1 Encounters:  10/13/21 76         Passed - Valid encounter within last 12 months    Recent Outpatient Visits          1 month ago Annual physical exam   [facility] Larae Grooms, NP   4 months ago Urinary urgency   [facility] Larae Grooms, NP   5 months ago Urinary urgency   [facility] Larae Grooms, NP   8 months ago Anxiety   [facility] Larae Grooms, NP   1 year ago Anxiety   [facility] Larae Grooms, NP      Future Appointments            In 4 months Larae Grooms, NP [facility]

## 2021-10-20 ENCOUNTER — Encounter: Payer: Self-pay | Admitting: Nurse Practitioner

## 2021-10-24 ENCOUNTER — Ambulatory Visit
Admission: RE | Admit: 2021-10-24 | Discharge: 2021-10-24 | Disposition: A | Discharge status: Home / Self Care | Payer: Medicare HMO | Source: Ambulatory Visit | Attending: Nurse Practitioner | Admitting: Nurse Practitioner

## 2021-10-24 DIAGNOSIS — Z1231 Encounter for screening mammogram for malignant neoplasm of breast: Secondary | ICD-10-CM | POA: Diagnosis not present

## 2021-10-25 ENCOUNTER — Other Ambulatory Visit: Payer: Self-pay | Admitting: *Deleted

## 2021-10-25 ENCOUNTER — Inpatient Hospital Stay
Admission: RE | Admit: 2021-10-25 | Discharge: 2021-10-25 | Disposition: A | Discharge status: Home / Self Care | Payer: Self-pay | Source: Ambulatory Visit | Attending: *Deleted | Admitting: *Deleted

## 2021-10-25 DIAGNOSIS — Z1231 Encounter for screening mammogram for malignant neoplasm of breast: Secondary | ICD-10-CM

## 2021-10-26 ENCOUNTER — Other Ambulatory Visit: Payer: Self-pay | Admitting: Nurse Practitioner

## 2021-10-26 DIAGNOSIS — N6489 Other specified disorders of breast: Secondary | ICD-10-CM

## 2021-10-26 DIAGNOSIS — R928 Other abnormal and inconclusive findings on diagnostic imaging of breast: Secondary | ICD-10-CM

## 2021-10-26 NOTE — Progress Notes (Signed)
Please let patient know her mammogram showed some asymetry they would like a diagnostic mammogram and [place].  I have placed these orders. Please make sure she gets her follow up scheduled.

## 2021-11-20 ENCOUNTER — Other Ambulatory Visit: Payer: Self-pay | Admitting: Nurse Practitioner

## 2021-11-21 NOTE — Telephone Encounter (Signed)
Unable to refill per protocol, rx request is too soon. last refill 09/20/21 for 90 and 3 rf.E-Prescribing Status: Receipt confirmed by pharmacy (09/19/2021 12:33 PM EDT). Will refuse request.  Requested Prescriptions  Pending Prescriptions Disp Refills  . rosuvastatin (CRESTOR) 40 MG tablet [Pharmacy Med Name: ROSUVASTATIN 40MG  TABLETS] 90 tablet 0    Sig: TAKE 1 TABLET(40 MG) BY MOUTH DAILY     Cardiovascular:  Antilipid - Statins 2 Failed - 11/20/2021  8:24 AM      Failed - Lipid Panel in normal range within the last 12 months    Cholesterol, Total  Date Value Ref Range Status  08/31/2021 154 100 - 199 mg/dL Final   LDL Chol Calc (NIH)  Date Value Ref Range Status  08/31/2021 81 0 - 99 mg/dL Final   HDL  Date Value Ref Range Status  08/31/2021 50 >39 mg/dL Final   Triglycerides  Date Value Ref Range Status  08/31/2021 130 0 - 149 mg/dL Final         Passed - Cr in normal range and within 360 days    Creatinine, Ser  Date Value Ref Range Status  08/31/2021 0.92 0.57 - 1.00 mg/dL Final         Passed - Patient is not pregnant      Passed - Valid encounter within last 12 months    Recent Outpatient Visits          2 months ago Annual physical exam   [facility] Jon Billings, NP   5 months ago Urinary urgency   [facility] Jon Billings, NP   6 months ago Urinary urgency   [facility] Jon Billings, NP   9 months ago [place], Karen, NP   1 year ago [place], Karen, NP      Future Appointments            In 3 months Jon Billings, NP [facility] [place]

## 2021-11-28 ENCOUNTER — Other Ambulatory Visit: Payer: Self-pay | Admitting: Nurse Practitioner

## 2021-11-28 ENCOUNTER — Telehealth: Payer: Self-pay | Admitting: Nurse Practitioner

## 2021-11-28 NOTE — Telephone Encounter (Signed)
Copied from [place] [phone]. Topic: Medicare AWV >> Nov 28, 2021 10:12 AM Josephina Gip wrote: Reason for CRM: Left message for patient to call back and schedule Medicare Annual Wellness Visit (AWV) to be done virtually or by telephone.  No hx of AWV eligible as of 08/06/21  Please schedule at anytime with [facility]-Nurse Health Advisor.      81 Minutes appointment   Any questions, please call me at [phone]

## 2021-11-29 NOTE — Telephone Encounter (Signed)
Requested Prescriptions  Pending Prescriptions Disp Refills  . venlafaxine XR (EFFEXOR-XR) 150 MG 24 hr capsule [Pharmacy Med Name: VENLAFAXINE HCL ER 150 MG Capsule Extended Release 24 Hour] 90 capsule 0    Sig: TAKE 1 CAPSULE EVERY DAY WITH BREAKFAST     Psychiatry: Antidepressants - SNRI - desvenlafaxine & venlafaxine Failed - 11/28/2021  5:04 PM      Failed - Lipid Panel in normal range within the last 12 months    Cholesterol, Total  Date Value Ref Range Status  08/31/2021 154 100 - 199 mg/dL Final   LDL Chol Calc (NIH)  Date Value Ref Range Status  08/31/2021 81 0 - 99 mg/dL Final   HDL  Date Value Ref Range Status  08/31/2021 50 >39 mg/dL Final   Triglycerides  Date Value Ref Range Status  08/31/2021 130 0 - 149 mg/dL Final         Passed - Cr in normal range and within 360 days    Creatinine, Ser  Date Value Ref Range Status  08/31/2021 0.92 0.57 - 1.00 mg/dL Final         Passed - Completed PHQ-2 or PHQ-9 in the last 360 days      Passed - Last BP in normal range    BP Readings from Last 1 Encounters:  10/13/21 116/77         Passed - Valid encounter within last 6 months    Recent Outpatient Visits          3 months ago Annual physical exam   [facility] Jon Billings, NP   6 months ago Urinary urgency   [facility] Jon Billings, NP   7 months ago Urinary urgency   [facility] Jon Billings, NP   9 months ago [place], Karen, NP   1 year ago [place], Karen, NP      Future Appointments            In 3 months Jon Billings, NP [facility] [place]

## 2021-12-15 ENCOUNTER — Ambulatory Visit
Admission: RE | Admit: 2021-12-15 | Discharge: 2021-12-15 | Disposition: A | Discharge status: Home / Self Care | Payer: Medicare HMO | Source: Ambulatory Visit | Attending: Nurse Practitioner | Admitting: Nurse Practitioner

## 2021-12-15 DIAGNOSIS — R928 Other abnormal and inconclusive findings on diagnostic imaging of breast: Secondary | ICD-10-CM | POA: Diagnosis not present

## 2021-12-15 DIAGNOSIS — N6489 Other specified disorders of breast: Secondary | ICD-10-CM | POA: Insufficient documentation

## 2021-12-19 ENCOUNTER — Other Ambulatory Visit: Payer: Self-pay | Admitting: Nurse Practitioner

## 2021-12-19 DIAGNOSIS — R928 Other abnormal and inconclusive findings on diagnostic imaging of breast: Secondary | ICD-10-CM

## 2021-12-19 NOTE — Progress Notes (Signed)
Please let patient know that her Mammogram showed that there was some asymmetry on the right breast.  The Radiologist would like her to have a repeat mammogram in 6 months to evaluate this again.  Please Make sure she has this scheduled.

## 2022-01-02 NOTE — Progress Notes (Unsigned)
There were no vitals taken for this visit.   Subjective:    Patient ID: Jill Burch, female    DOB: Nov 22, 1960, 61 y.o.   MRN: [id number]  HPI: Jill Burch is a 61 y.o. female  No chief complaint on file.  DEPRESSION/ANXIETY Patient states the Effexor is working well for her.  She feels like this is a good dose for her.  Denies concerns at visit today.   Flowsheet Row Office Visit from 08/31/2021 in [facility]  PHQ-9 Total Score 12        08/31/2021    3:14 PM 06/01/2021    3:14 PM 04/28/2021    3:18 PM 02/14/2021    3:27 PM  GAD 7 : Generalized Anxiety Score  Nervous, Anxious, on Edge 2 2 1 2   Control/stop worrying 1 1 0 2  Worry too much - different things 1 0 0 1  Trouble relaxing 0 0 0 1  Restless 0 0 0 0  Easily annoyed or irritable 1 1 2 2   Afraid - awful might happen 2 0 0 1  Total GAD 7 Score 7 4 3 9   Anxiety Difficulty Not difficult at all Somewhat difficult Not difficult at all Not difficult at all     HYPERLIPIDEMIA Hyperlipidemia status: excellent compliance Satisfied with current treatment?  no Side effects:  no Medication compliance: excellent compliance Past cholesterol meds: rosuvastatin (crestor) and lovaza Supplements: none Aspirin:  no The 10-year ASCVD risk score (Arnett DK, et al., 2019) is: 3.3%   Values used to calculate the score:     Age: 37 years     Sex: Female     Is Non-Hispanic African American: No     Diabetic: No     Tobacco smoker: No     Systolic Blood Pressure: 116 mmHg     Is BP treated: No     HDL Cholesterol: 50 mg/dL     Total Cholesterol: 154 mg/dL Chest pain:  no Coronary artery disease:  no Family history CAD:  no Family history early CAD:  no  Patient states she is having a lot of ringing in her ears secondary to a pinched nerve in her neck.  She would like to see a new ENT.  Relevant past medical, surgical, family and social history reviewed and updated as indicated. Interim medical history since our  last visit reviewed. Allergies and medications reviewed and updated.  Review of Systems  Eyes:  Negative for visual disturbance.  Respiratory:  Negative for cough, chest tightness and shortness of breath.   Cardiovascular:  Negative for chest pain, palpitations and leg swelling.  Neurological:  Negative for dizziness and headaches.  Psychiatric/Behavioral:  Negative for dysphoric mood and suicidal ideas. The patient is nervous/anxious.    Per HPI unless specifically indicated above     Objective:    There were no vitals taken for this visit.  Wt Readings from Last 3 Encounters:  10/13/21 197 lb (89.4 kg)  08/31/21 197 lb 12.8 oz (89.7 kg)  06/01/21 198 lb 12.8 oz (90.2 kg)    Physical Exam Vitals and nursing note reviewed.  Constitutional:      General: She is not in acute distress.    Appearance: Normal appearance. She is obese. She is not ill-appearing, toxic-appearing or diaphoretic.  HENT:     Head: Normocephalic.     Right Ear: External ear normal.     Left Ear: External ear normal.     Nose:  Nose normal.     Mouth/Throat:     Mouth: Mucous membranes are moist.     Pharynx: Oropharynx is clear.  Eyes:     General:        Right eye: No discharge.        Left eye: No discharge.     Extraocular Movements: Extraocular movements intact.     Conjunctiva/sclera: Conjunctivae normal.     Pupils: Pupils are equal, round, and reactive to light.  Cardiovascular:     Rate and Rhythm: Normal rate and regular rhythm.     Heart sounds: No murmur heard. Pulmonary:     Effort: Pulmonary effort is normal. No respiratory distress.     Breath sounds: Normal breath sounds. No wheezing or rales.  Musculoskeletal:     Cervical back: Normal range of motion and neck supple.  Skin:    General: Skin is warm and dry.     Capillary Refill: Capillary refill takes less than 2 seconds.  Neurological:     General: No focal deficit present.     Mental Status: She is alert and oriented to  person, place, and time. Mental status is at baseline.  Psychiatric:        Mood and Affect: Mood normal.        Behavior: Behavior normal.        Thought Content: Thought content normal.        Judgment: Judgment normal.    Results for orders placed or performed during the hospital encounter of 10/13/21  Glucose, capillary  Result Value Ref Range   Glucose-Capillary 89 70 - 99 mg/dL      Assessment & Plan:   Problem List Items Addressed This Visit      Other   Anxiety - Primary   Depression   Hyperlipidemia   Lymphedema   Pre-diabetes     Follow up plan: No follow-ups on file.

## 2022-01-03 ENCOUNTER — Encounter: Payer: Self-pay | Admitting: Nurse Practitioner

## 2022-01-03 ENCOUNTER — Ambulatory Visit (INDEPENDENT_AMBULATORY_CARE_PROVIDER_SITE_OTHER): Payer: Medicare HMO | Admitting: Nurse Practitioner

## 2022-01-03 VITALS — BP 119/68 | HR 61 | Temp 97.8°F | Ht 62.0 in | Wt 200.7 lb

## 2022-01-03 DIAGNOSIS — Z6836 Body mass index (BMI) 36.0-36.9, adult: Secondary | ICD-10-CM

## 2022-01-03 DIAGNOSIS — E785 Hyperlipidemia, unspecified: Secondary | ICD-10-CM

## 2022-01-03 DIAGNOSIS — F419 Anxiety disorder, unspecified: Secondary | ICD-10-CM

## 2022-01-03 DIAGNOSIS — R7303 Prediabetes: Secondary | ICD-10-CM

## 2022-01-03 DIAGNOSIS — F32A Depression, unspecified: Secondary | ICD-10-CM | POA: Diagnosis not present

## 2022-01-03 DIAGNOSIS — I890 Lymphedema, not elsewhere classified: Secondary | ICD-10-CM

## 2022-01-03 MED ORDER — ROSUVASTATIN CALCIUM 40 MG PO TABS: 40.0000 mg | ORAL_TABLET | Freq: Every day | ORAL | 3 refills | Status: DC

## 2022-01-03 MED ORDER — VENLAFAXINE HCL ER 75 MG PO CP24: 75.0000 mg | ORAL_CAPSULE | Freq: Every day | ORAL | 0 refills | Status: DC

## 2022-01-03 MED ORDER — VENLAFAXINE HCL ER 150 MG PO CP24: ORAL_CAPSULE | ORAL | 0 refills | Status: DC

## 2022-01-03 MED ORDER — ROPINIROLE HCL 0.5 MG PO TABS: 0.5000 mg | ORAL_TABLET | Freq: Every day | ORAL | 1 refills | Status: DC

## 2022-01-03 NOTE — Assessment & Plan Note (Signed)
Labs ordered at visit today.  Will make recommendations based on lab results.   

## 2022-01-03 NOTE — Assessment & Plan Note (Signed)
Chronic.  Controlled.  Continue with current medication regimen of Crestor 40mg.   Labs ordered today.  Return to clinic in 6 months for reevaluation.  Call sooner if concerns arise.   

## 2022-01-03 NOTE — Assessment & Plan Note (Signed)
Chronic.  Controlled.  Will increase dose of Effexor from 150mg  to 75mg .  Refill sent today.  If not well controlled at next visit can add Wellbutrin.  Labs ordered today.  Return to clinic in 1 month for reevaluation.  Call sooner if concerns arise.

## 2022-01-03 NOTE — Assessment & Plan Note (Signed)
Chronic.  Controlled.  Will increase dose of Effexor from 150mg to 75mg.  Refill sent today.  If not well controlled at next visit can add Wellbutrin.  Labs ordered today.  Return to clinic in 1 month for reevaluation.  Call sooner if concerns arise.  

## 2022-01-03 NOTE — Assessment & Plan Note (Signed)
Recommended eating smaller high protein, low fat meals more frequently and exercising 30 mins a day 5 times a week with a goal of 10-15lb weight loss in the next 3 months. Patient voiced their understanding and motivation to adhere to these recommendations.  

## 2022-01-04 LAB — COMPREHENSIVE METABOLIC PANEL
ALT: 23 IU/L (ref 0–32)
AST: 23 IU/L (ref 0–40)
Albumin/Globulin Ratio: 2.2 (ref 1.2–2.2)
Albumin: 4.8 g/dL (ref 3.9–4.9)
Alkaline Phosphatase: 76 IU/L (ref 44–121)
BUN/Creatinine Ratio: 19 (ref 12–28)
BUN: 15 mg/dL (ref 8–27)
Bilirubin Total: <0.2 mg/dL (ref 0.0–1.2)
CO2: 26 mmol/L (ref 20–29)
Calcium: 9.7 mg/dL (ref 8.7–10.3)
Chloride: 99 mmol/L (ref 96–106)
Creatinine, Ser: 0.78 mg/dL (ref 0.57–1.00)
Globulin, Total: 2.2 g/dL (ref 1.5–4.5)
Glucose: 79 mg/dL (ref 70–99)
Potassium: 4.4 mmol/L (ref 3.5–5.2)
Sodium: 139 mmol/L (ref 134–144)
Total Protein: 7 g/dL (ref 6.0–8.5)
eGFR: 85 mL/min/{1.73_m2} (ref >59)

## 2022-01-04 LAB — LIPID PANEL
Chol/HDL Ratio: 3.4 ratio (ref 0.0–4.4)
Cholesterol, Total: 176 mg/dL (ref 100–199)
HDL: 52 mg/dL (ref >39)
LDL Chol Calc (NIH): 99 mg/dL (ref 0–99)
Triglycerides: 141 mg/dL (ref 0–149)
VLDL Cholesterol Cal: 25 mg/dL (ref 5–40)

## 2022-01-04 LAB — HEMOGLOBIN A1C
Est. average glucose Bld gHb Est-mCnc: 117 mg/dL
Hgb A1c MFr Bld: 5.7 % — ABNORMAL HIGH (ref 4.8–5.6)

## 2022-01-04 NOTE — Progress Notes (Signed)
Please let patient know that her lab work looks good.  Her A1c remains stable at 5.7.  Her liver, kidneys electrolytes look good.  No concerns at this time.  Continue with current medication regimen.

## 2022-02-07 ENCOUNTER — Ambulatory Visit (INDEPENDENT_AMBULATORY_CARE_PROVIDER_SITE_OTHER): Payer: Medicare HMO | Admitting: Nurse Practitioner

## 2022-02-07 ENCOUNTER — Encounter: Payer: Self-pay | Admitting: Nurse Practitioner

## 2022-02-07 VITALS — BP 133/88 | HR 71 | Temp 98.8°F | Wt 202.4 lb

## 2022-02-07 DIAGNOSIS — J029 Acute pharyngitis, unspecified: Secondary | ICD-10-CM

## 2022-02-07 DIAGNOSIS — F419 Anxiety disorder, unspecified: Secondary | ICD-10-CM | POA: Diagnosis not present

## 2022-02-07 DIAGNOSIS — J069 Acute upper respiratory infection, unspecified: Secondary | ICD-10-CM | POA: Diagnosis not present

## 2022-02-07 DIAGNOSIS — F32A Depression, unspecified: Secondary | ICD-10-CM | POA: Diagnosis not present

## 2022-02-07 DIAGNOSIS — R051 Acute cough: Secondary | ICD-10-CM

## 2022-02-07 NOTE — Progress Notes (Signed)
BP 133/88   Pulse 71   Temp 98.8 F (37.1 C) (Oral)   Wt 202 lb 6.4 oz (91.8 kg)   SpO2 94%   BMI 37.02 kg/m    Subjective:    Patient ID: Jill Burch, female    DOB: Sep 09, 1960, 62 y.o.   MRN: [id number]  HPI: Jill Burch is a 62 y.o. female  Chief Complaint  Patient presents with   Depression   Anxiety   URI    Pt states she has had a sore throat, cough, and chest congestion since yesterday    DEPRESSION/ANXIETY Patient states her mood has been better.  She feels like now that Christmas is over it is a relief for her.  Happy with current medication regimen.  Denies SI.   [facility] Office Visit from 02/07/2022 in [place]  PHQ-9 Total Score 7         02/07/2022    3:09 PM 01/03/2022    2:49 PM 08/31/2021    3:14 PM 06/01/2021    3:14 PM  GAD 7 : Generalized Anxiety Score  Nervous, Anxious, on Edge 0 _0 Control/stop worrying 0 0 1 1  Worry too much - different things 1 0 1 0  Trouble relaxing 0 0 0 0  Restless 0 0 0 0  Easily annoyed or irritable _1 Afraid - awful might happen _2 0  Total GAD 7 Score _3 Anxiety Difficulty Not difficult at all Somewhat difficult Not difficult at all Somewhat difficult   UPPER RESPIRATORY TRACT INFECTION Worst symptom: symptoms started yesterday evening Fever: no Cough: yes Shortness of breath: no Wheezing: no Chest pain: yes, with cough Chest tightness: yes Chest congestion: no Nasal congestion: yes Runny nose: yes Post nasal drip: yes Sneezing: yes Sore throat: yes Swollen glands: no Sinus pressure: no Headache: yes Face pain: no Toothache: no Ear pain: no bilateral Ear pressure: no bilateral Eyes red/itching:no Eye drainage/crusting: no  Vomiting: no Rash: no Fatigue: yes Sick contacts: no Strep contacts: no  Context: worse Recurrent sinusitis: no Relief with OTC cold/cough medications: yes  Treatments attempted: cold/sinus      Relevant past medical, surgical,  family and social history reviewed and updated as indicated. Interim medical history since our last visit reviewed. Allergies and medications reviewed and updated.  Review of Systems  Constitutional:  Positive for fatigue. Negative for fever.  HENT:  Positive for congestion, postnasal drip, rhinorrhea, sneezing and sore throat. Negative for dental problem, ear pain, sinus pressure and sinus pain.   Eyes:  Negative for visual disturbance.  Respiratory:  Positive for cough and chest tightness. Negative for shortness of breath and wheezing.   Cardiovascular:  Negative for chest pain, palpitations and leg swelling.  Gastrointestinal:  Negative for vomiting.  Skin:  Negative for rash.  Neurological:  Positive for headaches. Negative for dizziness.  Psychiatric/Behavioral:  Positive for dysphoric mood. Negative for suicidal ideas. The patient is nervous/anxious.     Per HPI unless specifically indicated above     Objective:    BP 133/88   Pulse 71   Temp 98.8 F (37.1 C) (Oral)   Wt 202 lb 6.4 oz (91.8 kg)   SpO2 94%   BMI 37.02 kg/m   Wt Readings from Last 3 Encounters:  02/07/22 202 lb 6.4 oz (91.8 kg)  01/03/22 200 lb 11.2 oz (91 kg)  10/13/21 197 lb (89.4 kg)  Physical Exam Vitals and nursing note reviewed.  Constitutional:      General: She is not in acute distress.    Appearance: Normal appearance. She is obese. She is not ill-appearing, toxic-appearing or diaphoretic.  HENT:     Head: Normocephalic.     Right Ear: Tympanic membrane and external ear normal.     Left Ear: Tympanic membrane and external ear normal.     Nose: Congestion present.     Mouth/Throat:     Mouth: Mucous membranes are moist.     Pharynx: Oropharynx is clear. Posterior oropharyngeal erythema present. No oropharyngeal exudate.  Eyes:     General:        Right eye: No discharge.        Left eye: No discharge.     Extraocular Movements: Extraocular movements intact.     Conjunctiva/sclera:  Conjunctivae normal.     Pupils: Pupils are equal, round, and reactive to light.  Cardiovascular:     Rate and Rhythm: Normal rate and regular rhythm.     Heart sounds: No murmur heard. Pulmonary:     Effort: Pulmonary effort is normal. No respiratory distress.     Breath sounds: Normal breath sounds. No wheezing or rales.  Musculoskeletal:     Cervical back: Normal range of motion and neck supple.  Skin:    General: Skin is warm and dry.     Capillary Refill: Capillary refill takes less than 2 seconds.  Neurological:     General: No focal deficit present.     Mental Status: She is alert and oriented to person, place, and time. Mental status is at baseline.  Psychiatric:        Mood and Affect: Mood normal.        Behavior: Behavior normal.        Thought Content: Thought content normal.        Judgment: Judgment normal.     Results for orders placed or performed in visit on 01/03/22  Comp Met (CMET)  Result Value Ref Range   Glucose 79 70 - 99 mg/dL   BUN 15 8 - 27 mg/dL   Creatinine, Ser 0.78 0.57 - 1.00 mg/dL   eGFR 85 >59 mL/min/1.73   BUN/Creatinine Ratio 19 12 - 28   Sodium 139 134 - 144 mmol/L   Potassium 4.4 3.5 - 5.2 mmol/L   Chloride 99 96 - 106 mmol/L   CO2 26 20 - 29 mmol/L   Calcium 9.7 8.7 - 10.3 mg/dL   Total Protein 7.0 6.0 - 8.5 g/dL   Albumin 4.8 3.9 - 4.9 g/dL   Globulin, Total 2.2 1.5 - 4.5 g/dL   Albumin/Globulin Ratio 2.2 1.2 - 2.2   Bilirubin Total <0.2 0.0 - 1.2 mg/dL   Alkaline Phosphatase 76 44 - 121 IU/L   AST 23 0 - 40 IU/L   ALT 23 0 - 32 IU/L  Lipid Profile  Result Value Ref Range   Cholesterol, Total 176 100 - 199 mg/dL   Triglycerides 141 0 - 149 mg/dL   HDL 52 >39 mg/dL   VLDL Cholesterol Cal 25 5 - 40 mg/dL   LDL Chol Calc (NIH) 99 0 - 99 mg/dL   Chol/HDL Ratio 3.4 0.0 - 4.4 ratio  HgB A1c  Result Value Ref Range   Hgb A1c MFr Bld 5.7 (H) 4.8 - 5.6 %   Est. average glucose Bld gHb Est-mCnc 117 mg/dL      Assessment & Plan:  Problem List Items Addressed This Visit       Other   Anxiety    Chronic.  Controlled.  Continue with current medication regimen.  Labs ordered today.  Return to clinic in 6 months for reevaluation.  Call sooner if concerns arise.       Depression - Primary    Chronic.  Controlled.  Continue with current medication regimen.  Labs ordered today.  Return to clinic in 6 months for reevaluation.  Call sooner if concerns arise.        Other Visit Diagnoses     Viral upper respiratory tract infection       Likely viral in nature. Continue with OTC symptom management. Stay well hydrated. Flu and Strep neg in office. Follow up if symptoms not improved.   Acute cough       Relevant Orders   Veritor Flu A/B Waived   Novel Coronavirus, NAA ([facility])   Sore throat       Relevant Orders   Rapid Strep screen([facility])   Veritor Flu A/B Waived   Novel Coronavirus, NAA ([facility])        Follow up plan: Return in about 5 months (around 07/09/2022) for HTN, HLD, DM2 FU.

## 2022-02-07 NOTE — Assessment & Plan Note (Signed)
Chronic.  Controlled.  Continue with current medication regimen.  Labs ordered today.  Return to clinic in 6 months for reevaluation.  Call sooner if concerns arise.  ? ?

## 2022-02-07 NOTE — Progress Notes (Signed)
Results discussed with patient during visit.

## 2022-02-08 LAB — NOVEL CORONAVIRUS, NAA: SARS-CoV-2, NAA: NOT DETECTED

## 2022-02-09 NOTE — Progress Notes (Signed)
Please let patient know her COVID test was negative.

## 2022-02-10 LAB — VERITOR FLU A/B WAIVED
Influenza A: NEGATIVE
Influenza B: NEGATIVE

## 2022-02-10 LAB — CULTURE, GROUP A STREP

## 2022-02-10 LAB — RAPID STREP SCREEN (MED CTR MEBANE ONLY): Strep Gp A Ag, IA W/Reflex: NEGATIVE

## 2022-02-10 NOTE — Progress Notes (Signed)
Please let patient know that she grew beta hemolytic strep.  This does not need antibiotic treatment.  It is a normal bacteria that can grow.

## 2022-02-21 ENCOUNTER — Ambulatory Visit: Payer: Medicare HMO | Admitting: Nurse Practitioner

## 2022-02-28 ENCOUNTER — Ambulatory Visit: Payer: Medicare HMO | Admitting: Nurse Practitioner

## 2022-03-03 ENCOUNTER — Ambulatory Visit: Payer: Medicare HMO | Admitting: Nurse Practitioner

## 2022-04-08 ENCOUNTER — Other Ambulatory Visit: Payer: Self-pay | Admitting: Nurse Practitioner

## 2022-04-10 NOTE — Telephone Encounter (Signed)
Last RF 01/03/22 #90 1 RF  Requested Prescriptions  Refused Prescriptions Disp Refills   rOPINIRole (REQUIP) 0.5 MG tablet [Pharmacy Med Name: ROPINIROLE HYDROCHLORIDE 0.5 MG Tablet] 90 tablet 3    Sig: TAKE 1 TABLET AT BEDTIME     Neurology:  Parkinsonian Agents Passed - 04/08/2022 12:34 PM      Passed - Last BP in normal range    BP Readings from Last 1 Encounters:  02/07/22 133/88         Passed - Last Heart Rate in normal range    Pulse Readings from Last 1 Encounters:  02/07/22 71         Passed - Valid encounter within last 12 months    Recent Outpatient Visits           2 months ago Depression, unspecified depression type   [place], Karen, NP   3 months ago [place], Karen, NP   7 months ago Annual physical exam   [place] Jon Billings, NP   10 months ago Urinary urgency   [place], NP   11 months ago Urinary urgency   [place] Jon Billings, NP       Future Appointments             In 3 months Jon Billings, NP [place], [facility]

## 2022-04-20 ENCOUNTER — Other Ambulatory Visit: Payer: Self-pay | Admitting: Nurse Practitioner

## 2022-04-21 NOTE — Telephone Encounter (Signed)
Requested Prescriptions  Pending Prescriptions Disp Refills   rOPINIRole (REQUIP) 0.5 MG tablet [Pharmacy Med Name: ROPINIROLE HYDROCHLORIDE 0.5 MG Tablet] 90 tablet 3    Sig: TAKE 1 TABLET AT BEDTIME     Neurology:  Parkinsonian Agents Passed - 04/20/2022 12:55 PM      Passed - Last BP in normal range    BP Readings from Last 1 Encounters:  02/07/22 133/88         Passed - Last Heart Rate in normal range    Pulse Readings from Last 1 Encounters:  02/07/22 71         Passed - Valid encounter within last 12 months    Recent Outpatient Visits           2 months ago Depression, unspecified depression type   [place], NP   3 months ago [place], Karen, NP   7 months ago Annual physical exam   [place] Jon Billings, NP   10 months ago Urinary urgency   [place] Jon Billings, NP   11 months ago Urinary urgency   [place] Jon Billings, NP       Future Appointments             In 2 months Jon Billings, NP [facility], [facility]             venlafaxine XR (EFFEXOR-XR) 150 MG 24 hr capsule [Pharmacy Med Name: VENLAFAXINE HYDROCHLORIDEER 150 MG Capsule Extended Release 24 Hour] 90 capsule 3    Sig: TAKE 1 [place]     Psychiatry: Antidepressants - SNRI - desvenlafaxine & venlafaxine Failed - 04/20/2022 12:55 PM      Failed - Lipid Panel in normal range within the last 12 months    Cholesterol, Total  Date Value Ref Range Status  01/03/2022 176 100 - 199 mg/dL Final   LDL Chol Calc (NIH)  Date Value Ref Range Status  01/03/2022 99 0 - 99 mg/dL Final   HDL  Date Value Ref Range Status  01/03/2022 52 >39 mg/dL Final   Triglycerides  Date Value Ref Range Status  01/03/2022 141 0 - 149 mg/dL Final         Passed - Cr in normal  range and within 360 days    Creatinine, Ser  Date Value Ref Range Status  01/03/2022 0.78 0.57 - 1.00 mg/dL Final         Passed - Completed PHQ-2 or PHQ-9 in the last 360 days      Passed - Last BP in normal range    BP Readings from Last 1 Encounters:  02/07/22 133/88         Passed - Valid encounter within last 6 months    Recent Outpatient Visits           2 months ago Depression, unspecified depression type   [place], NP   3 months ago [place], NP   7 months ago Annual physical exam   [place], NP   10 months ago Urinary urgency   [place], NP   11 months ago Urinary urgency   [place] Jon Billings, NP  Future Appointments             In 2 months Jon Billings, NP [place]

## 2022-05-02 ENCOUNTER — Other Ambulatory Visit: Payer: Self-pay | Admitting: Nurse Practitioner

## 2022-05-03 NOTE — Telephone Encounter (Signed)
Requested medication (s) are due for refill today: yes  Requested medication (s) are on the active medication list: yes  Last refill:  01/03/22 #90 0 refills  Future visit scheduled: yes  in 2 months   Notes to clinic:  no refills remain. Do you want to refill effexor 75 mg ?      Requested Prescriptions  Pending Prescriptions Disp Refills   venlafaxine XR (EFFEXOR-XR) 75 MG 24 hr capsule [Pharmacy Med Name: VENLAFAXINE ER 75MG  CAPSULES] 90 capsule 0    Sig: TAKE 1 CAPSULE(75 MG) BY MOUTH DAILY WITH BREAKFAST     Psychiatry: Antidepressants - SNRI - desvenlafaxine & venlafaxine Failed - 05/02/2022  5:09 PM      Failed - Lipid Panel in normal range within the last 12 months    Cholesterol, Total  Date Value Ref Range Status  01/03/2022 176 100 - 199 mg/dL Final   LDL Chol Calc (NIH)  Date Value Ref Range Status  01/03/2022 99 0 - 99 mg/dL Final   HDL  Date Value Ref Range Status  01/03/2022 52 >39 mg/dL Final   Triglycerides  Date Value Ref Range Status  01/03/2022 141 0 - 149 mg/dL Final         Passed - Cr in normal range and within 360 days    Creatinine, Ser  Date Value Ref Range Status  01/03/2022 0.78 0.57 - 1.00 mg/dL Final         Passed - Completed PHQ-2 or PHQ-9 in the last 360 days      Passed - Last BP in normal range    BP Readings from Last 1 Encounters:  02/07/22 133/88         Passed - Valid encounter within last 6 months    Recent Outpatient Visits           2 months ago Depression, unspecified depression type   [place], NP   4 months ago [place], NP   8 months ago Annual physical exam   [place], NP   11 months ago Urinary urgency   [place] Jon Billings, NP   1 year ago Urinary urgency   [place] Jon Billings, NP        Future Appointments             In 2 months Jon Billings, NP [place], [facility]

## 2022-05-23 ENCOUNTER — Telehealth: Payer: Self-pay | Admitting: Nurse Practitioner

## 2022-05-23 NOTE — Telephone Encounter (Signed)
Contacted Jill Burch to schedule Jill Burch. Appointment made for 06/05/2022.  Jill Burch; Care Guide Ambulatory Clinical Support [place] [facility] Direct Dial: [phone]

## 2022-06-01 DIAGNOSIS — Z0001 Encounter for general adult medical examination with abnormal findings: Secondary | ICD-10-CM | POA: Diagnosis not present

## 2022-06-01 DIAGNOSIS — I83899 Varicose veins of unspecified lower extremities with other complications: Secondary | ICD-10-CM | POA: Diagnosis not present

## 2022-06-01 DIAGNOSIS — F17210 Nicotine dependence, cigarettes, uncomplicated: Secondary | ICD-10-CM | POA: Diagnosis not present

## 2022-06-01 DIAGNOSIS — E785 Hyperlipidemia, unspecified: Secondary | ICD-10-CM | POA: Diagnosis not present

## 2022-06-01 DIAGNOSIS — F32A Depression, unspecified: Secondary | ICD-10-CM | POA: Diagnosis not present

## 2022-06-01 DIAGNOSIS — Z0000 Encounter for general adult medical examination without abnormal findings: Secondary | ICD-10-CM | POA: Diagnosis not present

## 2022-06-01 DIAGNOSIS — R7303 Prediabetes: Secondary | ICD-10-CM | POA: Diagnosis not present

## 2022-06-01 DIAGNOSIS — Z1331 Encounter for screening for depression: Secondary | ICD-10-CM | POA: Diagnosis not present

## 2022-06-05 ENCOUNTER — Ambulatory Visit (INDEPENDENT_AMBULATORY_CARE_PROVIDER_SITE_OTHER): Payer: Medicare HMO

## 2022-06-05 VITALS — Ht 62.0 in | Wt 202.0 lb

## 2022-06-05 DIAGNOSIS — Z0000 Encounter for general adult medical examination without abnormal findings: Secondary | ICD-10-CM

## 2022-06-05 NOTE — Progress Notes (Signed)
I connected with  Jill Burch on 06/05/22 by a audio enabled telemedicine application and verified that I am speaking with the correct person using two identifiers.  Patient Location: Home  Provider Location: Office/Clinic  I discussed the limitations of evaluation and management by telemedicine. The patient expressed understanding and agreed to proceed.  Subjective:   Jill Burch is a 62 y.o. female who presents for Medicare Annual (Subsequent) preventive examination.  Review of Systems     Cardiac Risk Factors include: advanced age (>53men, >37 women);obesity (BMI >30kg/m2);hypertension     Objective:    There were no vitals filed for this visit. There is no height or weight on file to calculate BMI.     06/05/2022    3:32 PM 10/13/2021    7:58 AM 10/14/2020    3:36 PM 10/14/2020    6:17 AM 10/04/2020    2:24 PM 08/08/2015    1:48 PM  Advanced Directives  Does Patient Have a Medical Advance Directive? No Yes Yes Yes Yes No  Type of Special educational needs teacher of [place];Living will Healthcare Power of [place];Living will  Healthcare Power of [place];Living will   Does patient want to make changes to medical advance directive?   No - Patient declined No - Patient declined    Copy of Healthcare Power of Attorney in Chart?   No - copy requested     Would patient like information on creating a medical advance directive? No - Patient declined   No - Patient declined No - Patient declined Yes - Educational materials given    Current Medications (verified) Outpatient Encounter Medications as of 06/05/2022  Medication Sig   acetaminophen (TYLENOL) 500 MG tablet Take 2 tablets (1,000 mg total) by mouth every 6 (six) hours as needed.   aspirin 81 MG EC tablet Take 81 mg by mouth daily. Swallow whole.   Beta Carotene (VITAMIN A) 25000 UNIT capsule Take 25,000 Units by mouth daily.   Homeopathic Products (ARNICARE) GEL Apply topically as needed.   Lidocaine HCl (ASPERCREME  LIDOCAINE) 4 % LIQD Apply topically as needed.   Multiple Vitamins-Minerals (ADULT GUMMY PO) Take 2 capsules by mouth daily.   rOPINIRole (REQUIP) 0.5 MG tablet Take 1 tablet (0.5 mg total) by mouth at bedtime.   rosuvastatin (CRESTOR) 40 MG tablet Take 1 tablet (40 mg total) by mouth daily.   venlafaxine XR (EFFEXOR XR) 75 MG 24 hr capsule Take 1 capsule (75 mg total) by mouth daily with breakfast.   venlafaxine XR (EFFEXOR-XR) 150 MG 24 hr capsule TAKE 1 CAPSULE EVERY DAY WITH BREAKFAST   Vitamin E 450 MG (1000 UT) CAPS Take by mouth.   No facility-administered encounter medications on file as of 06/05/2022.    Allergies (verified) Lactose and Lactose intolerance (gi)   History: Past Medical History:  Diagnosis Date   Anxiety    Depression    Hyperlipidemia    Osteoarthritis    PONV (postoperative nausea and vomiting)    Pre-diabetes    Past Surgical History:  Procedure Laterality Date   APPLICATION OF WOUND VAC  10/14/2020   Procedure: APPLICATION OF WOUND VAC;  Surgeon: Christena Flake, MD;  Location: [facility] ORS;  Service: Orthopedics;;   COLONOSCOPY WITH PROPOFOL N/A 10/13/2021   Procedure: COLONOSCOPY WITH PROPOFOL;  Surgeon: Wyline Mood, MD;  Location: [facility];  Service: Gastroenterology;  Laterality: N/A;   TOTAL KNEE ARTHROPLASTY Left 10/14/2020   Procedure: TOTAL KNEE ARTHROPLASTY;  Surgeon: Christena Flake,  MD;  Location: [facility] ORS;  Service: Orthopedics;  Laterality: Left;   TUBAL LIGATION  1995   Family History  Problem Relation Age of Onset   Depression Mother    Arthritis Mother    Heart disease Mother    Heart attack Mother    Cancer Father        bladder   Diabetes Father    Depression Father    Hypertension Father    Diabetes Brother    COPD Neg Hx    Stroke Neg Hx    Social History   Socioeconomic History   Marital status: Married    Spouse name: Dorene Sorrow   Number of children: 1   Years of education: Not on file   Highest education level: Not on file   Occupational History   Not on file  Tobacco Use   Smoking status: Former    Types: Cigarettes    Quit date: 02/06/1990    Years since quitting: 32.3   Smokeless tobacco: Never  Vaping Use   Vaping Use: Never used  Substance and Sexual Activity   Alcohol use: No    Alcohol/week: 0.0 standard drinks of alcohol   Drug use: No   Sexual activity: Not Currently  Other Topics Concern   Not on file  Social History Narrative   Live at home with husband.   Social Determinants of Health   Financial Resource Strain: Low Risk  (06/05/2022)   Overall Financial Resource Strain (CARDIA)    Difficulty of Paying Living Expenses: Not hard at all  Food Insecurity: No Food Insecurity (06/05/2022)   Hunger Vital Sign    Worried About Running Out of Food in the Last Year: Never true    Ran Out of Food in the Last Year: Never true  Transportation Needs: No Transportation Needs (06/05/2022)   PRAPARE - Administrator, Civil Service (Medical): No    Lack of Transportation (Non-Medical): No  Physical Activity: Insufficiently Active (06/05/2022)   Exercise Vital Sign    Days of Exercise per Week: 3 days    Minutes of Exercise per Session: 30 min  Stress: No Stress Concern Present (06/05/2022)   [employer] of Occupational Health - Occupational Stress Questionnaire    Feeling of Stress : Only a little  Social Connections: Moderately Integrated (06/05/2022)   Social Connection and Isolation Panel [NHANES]    Frequency of Communication with Friends and Family: More than three times a week    Frequency of Social Gatherings with Friends and Family: Twice a week    Attends Religious Services: More than 4 times per year    Active Member of [employer] or Organizations: No    Attends Engineer, structural: Never    Marital Status: Married    Tobacco Counseling Counseling given: Not Answered   Clinical Intake:  Pre-visit preparation completed: Yes  Pain : No/denies pain      Nutritional Risks: None Diabetes: No  How often do you need to have someone help you when you read instructions, pamphlets, or other written materials from your doctor or pharmacy?: 1 - Never  Diabetic?no  Interpreter Needed?: No  Information entered by :: Kennedy Bucker, LPN   Activities of Daily Living    06/05/2022    3:33 PM  In your present state of health, do you have any difficulty performing the following activities:  Hearing? 0  Vision? 0  Difficulty concentrating or making decisions? 0  Walking or climbing stairs? 1  Comment knees  Dressing or bathing? 0  Doing errands, shopping? 0  Preparing Food and eating ? N  Using the Toilet? N  In the past six months, have you accidently leaked urine? N  Do you have problems with loss of bowel control? N  Managing your Medications? N  Managing your Finances? N  Housekeeping or managing your Housekeeping? N    Patient Care Team: Larae Grooms, NP as PCP - General  Indicate any recent Medical Services you may have received from other than [facility] providers in the past year (date may be approximate).     Assessment:   This is a routine wellness examination for Jill Burch.  Hearing/Vision screen Hearing Screening - Comments:: No aids Vision Screening - Comments:: Readers-   Dietary issues and exercise activities discussed: Current Exercise Habits: Home exercise routine, Type of exercise: Other - see comments (gardening), Time (Minutes): 30, Frequency (Times/Week): 3, Weekly Exercise (Minutes/Week): 90, Intensity: Mild   Goals Addressed             This Visit's Progress    DIET - EAT MORE FRUITS AND VEGETABLES         Depression Screen    06/05/2022    3:30 PM 02/07/2022    3:09 PM 01/03/2022    2:49 PM 08/31/2021    3:12 PM 06/01/2021    3:14 PM 04/28/2021    3:18 PM 02/14/2021    3:27 PM  PHQ 2/9 Scores  PHQ - 2 Score 2 0 4 2 2 4 1   PHQ- 9 Score 4 7 11 12 9 10 5     Fall Risk    06/05/2022    3:33 PM  02/07/2022    3:09 PM 01/03/2022    2:48 PM 08/31/2021    3:12 PM 06/01/2021    3:14 PM  Fall Risk   Falls in the past year? 0 0 0 0 0  Number falls in past yr: 0 0 0 0 0  Injury with Fall? 0 0 0 0 0  Risk for fall due to : No Fall Risks No Fall Risks No Fall Risks No Fall Risks No Fall Risks  Follow up Falls prevention discussed;Falls evaluation completed Falls evaluation completed Falls evaluation completed Falls evaluation completed Falls evaluation completed    FALL RISK PREVENTION PERTAINING TO THE HOME:  Any stairs in or around the home? Yes  If so, are there any without handrails? No  Home free of loose throw rugs in walkways, pet beds, electrical cords, etc? Yes  Adequate lighting in your home to reduce risk of falls? Yes   ASSISTIVE DEVICES UTILIZED TO PREVENT FALLS:  Life alert? No  Use of a cane, walker or w/c? No  Grab bars in the bathroom? Yes  Shower chair or bench in shower? Yes  Elevated toilet seat or a handicapped toilet? No     Cognitive Function:        06/05/2022    3:39 PM  6CIT Screen  What Year? 0 points  What month? 0 points  What time? 0 points  Count back from 20 2 points  Months in reverse 0 points  Repeat phrase 8 points  Total Score 10 points    Immunizations Immunization History  Administered Date(s) Administered   Covid-19, Mrna,Vaccine(Spikevax)53yrs and older 12/05/2021   Influenza,inj,Quad PF,6+ Mos 11/08/2016, 10/15/2020   Influenza-Unspecified 09/24/2013, 08/27/2015, 10/08/2015, 09/15/2017, 11/08/2020, 11/09/2021   Pneumococcal Polysaccharide-23 06/20/2017   Rsv, Bivalent, Protein Subunit Rsvpref,pf Verdis Frederickson) 11/28/2021   Td  07/18/2006, 02/26/2017   Tdap 06/20/2017   Unspecified SARS-COV-2 Vaccination 05/01/2019, 11/28/2021   Zoster Recombinat (Shingrix) 11/08/2016, 06/20/2017    TDAP status: Up to date  Flu Vaccine status: Up to date  Pneumococcal vaccine status: Up to date  Covid-19 vaccine status: Completed  vaccines  Qualifies for Shingles Vaccine? Yes   Zostavax completed No   Shingrix Completed?: Yes  Screening Tests Health Maintenance  Topic Date Due   COVID-19 Vaccine (3 - 2023-24 season) 01/30/2022   INFLUENZA VACCINE  09/07/2022   MAMMOGRAM  10/25/2022   Medicare Annual Wellness (AWV)  06/05/2023   PAP SMEAR-Modifier  08/26/2023   DTaP/Tdap/Td (4 - Td or Tdap) 06/21/2027   COLONOSCOPY (Pts 45-89yrs Insurance coverage will need to be confirmed)  10/14/2031   Hepatitis C Screening  Completed   HIV Screening  Completed   Zoster Vaccines- Shingrix  Completed   HPV VACCINES  Aged Out    Health Maintenance  Health Maintenance Due  Topic Date Due   COVID-19 Vaccine (3 - 2023-24 season) 01/30/2022    Colorectal cancer screening: Type of screening: Colonoscopy. Completed 10/13/21. Repeat every 10 years  Mammogram status: Completed 10/24/21. Repeat every year  Lung Cancer Screening: (Low Dose CT Chest recommended if Age 63-80 years, 30 pack-year currently smoking OR have quit w/in 15years.) does not qualify.    Additional Screening:  Hepatitis C Screening: does qualify; Completed 02/26/17  Vision Screening: Recommended annual ophthalmology exams for early detection of glaucoma and other disorders of the eye. Is the patient up to date with their annual eye exam?  Yes  Who is the provider or what is the name of the office in which the patient attends annual eye exams? Can't remember If pt is not established with a provider, would they like to be referred to a provider to establish care? No .   Dental Screening: Recommended annual dental exams for proper oral hygiene  Community Resource Referral / Chronic Care Management: CRR required this visit?  No   CCM required this visit?  No      Plan:     I have personally reviewed and noted the following in the patient's chart:   Medical and social history Use of alcohol, tobacco or illicit drugs  Current medications and  supplements including opioid prescriptions. Patient is not currently taking opioid prescriptions. Functional ability and status Nutritional status Physical activity Advanced directives List of other physicians Hospitalizations, surgeries, and ER visits in previous 12 months Vitals Screenings to include cognitive, depression, and falls Referrals and appointments  In addition, I have reviewed and discussed with patient certain preventive protocols, quality metrics, and best practice recommendations. A written personalized care plan for preventive services as well as general preventive health recommendations were provided to patient.     Hal Hope, LPN   1/61/0960   Nurse Notes: none

## 2022-06-05 NOTE — Patient Instructions (Signed)
Jill Burch , Thank you for taking time to come for your Medicare Wellness Visit. I appreciate your ongoing commitment to your health goals. Please review the following plan we discussed and let me know if I can assist you in the future.   These are the goals we discussed:  Goals      DIET - EAT MORE FRUITS AND VEGETABLES        This is a list of the screening recommended for you and due dates:  Health Maintenance  Topic Date Due   COVID-19 Vaccine (3 - 2023-24 season) 01/30/2022   Flu Shot  09/07/2022   Mammogram  10/25/2022   Medicare Annual Wellness Visit  06/05/2023   Pap Smear  08/26/2023   DTaP/Tdap/Td vaccine (4 - Td or Tdap) 06/21/2027   Colon Cancer Screening  10/14/2031   Hepatitis C Screening: USPSTF Recommendation to screen - Ages 18-79 yo.  Completed   HIV Screening  Completed   Zoster (Shingles) Vaccine  Completed   HPV Vaccine  Aged Out    Advanced directives: no  Conditions/risks identified: none  Next appointment: Follow up in one year for your annual wellness visit. 06/11/23 @ 2:30 pm by phone  Preventive Care 40-64 Years, Female Preventive care refers to lifestyle choices and visits with your health care provider that can promote health and wellness. What does preventive care include? A yearly physical exam. This is also called an annual well check. Dental exams once or twice a year. Routine eye exams. Ask your health care provider how often you should have your eyes checked. Personal lifestyle choices, including: Daily care of your teeth and gums. Regular physical activity. Eating a healthy diet. Avoiding tobacco and drug use. Limiting alcohol use. Practicing safe sex. Taking low-dose aspirin daily starting at age 15. Taking vitamin and mineral supplements as recommended by your health care provider. What happens during an annual well check? The services and screenings done by your health care provider during your annual well check will depend on your  age, overall health, lifestyle risk factors, and family history of disease. Counseling  Your health care provider may ask you questions about your: Alcohol use. Tobacco use. Drug use. Emotional well-being. Home and relationship well-being. Sexual activity. Eating habits. Work and work Astronomer. Method of birth control. Menstrual cycle. Pregnancy history. Screening  You may have the following tests or measurements: Height, weight, and BMI. Blood pressure. Lipid and cholesterol levels. These may be checked every 5 years, or more frequently if you are over 60 years old. Skin check. Lung cancer screening. You may have this screening every year starting at age 44 if you have a 30-pack-year history of smoking and currently smoke or have quit within the past 15 years. Fecal occult blood test (FOBT) of the stool. You may have this test every year starting at age 43. Flexible sigmoidoscopy or colonoscopy. You may have a sigmoidoscopy every 5 years or a colonoscopy every 10 years starting at age 38. Hepatitis C blood test. Hepatitis B blood test. Sexually transmitted disease (STD) testing. Diabetes screening. This is done by checking your blood sugar (glucose) after you have not eaten for a while (fasting). You may have this done every 1-3 years. Mammogram. This may be done every 1-2 years. Talk to your health care provider about when you should start having regular mammograms. This may depend on whether you have a family history of breast cancer. BRCA-related cancer screening. This may be done if you have a family  history of breast, ovarian, tubal, or peritoneal cancers. Pelvic exam and Pap test. This may be done every 3 years starting at age 48. Starting at age 24, this may be done every 5 years if you have a Pap test in combination with an HPV test. Bone density scan. This is done to screen for osteoporosis. You may have this scan if you are at high risk for osteoporosis. Discuss your test  results, treatment options, and if necessary, the need for more tests with your health care provider. Vaccines  Your health care provider may recommend certain vaccines, such as: Influenza vaccine. This is recommended every year. Tetanus, diphtheria, and acellular pertussis (Tdap, Td) vaccine. You may need a Td booster every 10 years. Zoster vaccine. You may need this after age 89. Pneumococcal 13-valent conjugate (PCV13) vaccine. You may need this if you have certain conditions and were not previously vaccinated. Pneumococcal polysaccharide (PPSV23) vaccine. You may need one or two doses if you smoke cigarettes or if you have certain conditions. Talk to your health care provider about which screenings and vaccines you need and how often you need them. This information is not intended to replace advice given to you by your health care provider. Make sure you discuss any questions you have with your health care provider. Document Released: 02/19/2015 Document Revised: 10/13/2015 Document Reviewed: 11/24/2014 Elsevier Interactive Patient Education  2017 [employer].    Fall Prevention in the Home Falls can cause injuries. They can happen to people of all ages. There are many things you can do to make your home safe and to help prevent falls. What can I do on the outside of my home? Regularly fix the edges of walkways and driveways and fix any cracks. Remove anything that might make you trip as you walk through a door, such as a raised step or threshold. Trim any bushes or trees on the path to your home. Use bright outdoor lighting. Clear any walking paths of anything that might make someone trip, such as rocks or tools. Regularly check to see if handrails are loose or broken. Make sure that both sides of any steps have handrails. Any raised decks and porches should have guardrails on the edges. Have any leaves, snow, or ice cleared regularly. Use sand or salt on walking paths during  winter. Clean up any spills in your garage right away. This includes oil or grease spills. What can I do in the bathroom? Use night lights. Install grab bars by the toilet and in the tub and shower. Do not use towel bars as grab bars. Use non-skid mats or decals in the tub or shower. If you need to sit down in the shower, use a plastic, non-slip stool. Keep the floor dry. Clean up any water that spills on the floor as soon as it happens. Remove soap buildup in the tub or shower regularly. Attach bath mats securely with double-sided non-slip rug tape. Do not have throw rugs and other things on the floor that can make you trip. What can I do in the bedroom? Use night lights. Make sure that you have a light by your bed that is easy to reach. Do not use any sheets or blankets that are too big for your bed. They should not hang down onto the floor. Have a firm chair that has side arms. You can use this for support while you get dressed. Do not have throw rugs and other things on the floor that can make you  trip. What can I do in the kitchen? Clean up any spills right away. Avoid walking on wet floors. Keep items that you use a lot in easy-to-reach places. If you need to reach something above you, use a strong step stool that has a grab bar. Keep electrical cords out of the way. Do not use floor polish or wax that makes floors slippery. If you must use wax, use non-skid floor wax. Do not have throw rugs and other things on the floor that can make you trip. What can I do with my stairs? Do not leave any items on the stairs. Make sure that there are handrails on both sides of the stairs and use them. Fix handrails that are broken or loose. Make sure that handrails are as long as the stairways. Check any carpeting to make sure that it is firmly attached to the stairs. Fix any carpet that is loose or worn. Avoid having throw rugs at the top or bottom of the stairs. If you do have throw rugs,  attach them to the floor with carpet tape. Make sure that you have a light switch at the top of the stairs and the bottom of the stairs. If you do not have them, ask someone to add them for you. What else can I do to help prevent falls? Wear shoes that: Do not have high heels. Have rubber bottoms. Are comfortable and fit you well. Are closed at the toe. Do not wear sandals. If you use a stepladder: Make sure that it is fully opened. Do not climb a closed stepladder. Make sure that both sides of the stepladder are locked into place. Ask someone to hold it for you, if possible. Clearly mark and make sure that you can see: Any grab bars or handrails. First and last steps. Where the edge of each step is. Use tools that help you move around (mobility aids) if they are needed. These include: Canes. Walkers. Scooters. Crutches. Turn on the lights when you go into a dark area. Replace any light bulbs as soon as they burn out. Set up your furniture so you have a clear path. Avoid moving your furniture around. If any of your floors are uneven, fix them. If there are any pets around you, be aware of where they are. Review your medicines with your doctor. Some medicines can make you feel dizzy. This can increase your chance of falling. Ask your doctor what other things that you can do to help prevent falls. This information is not intended to replace advice given to you by your health care provider. Make sure you discuss any questions you have with your health care provider. Document Released: 11/19/2008 Document Revised: 07/01/2015 Document Reviewed: 02/27/2014 Elsevier Interactive Patient Education  2017 [employer].

## 2022-07-03 ENCOUNTER — Other Ambulatory Visit: Payer: Self-pay | Admitting: Nurse Practitioner

## 2022-07-04 NOTE — Telephone Encounter (Signed)
Rx 04/21/22 #90- too soon Requested Prescriptions  Pending Prescriptions Disp Refills   venlafaxine XR (EFFEXOR-XR) 150 MG 24 hr capsule [Pharmacy Med Name: VENLAFAXINE HYDROCHLORIDEER 150 MG Capsule Extended Release 24 Hour] 90 capsule 3    Sig: TAKE 1 CAPSULE EVERY DAY WITH BREAKFAST     Psychiatry: Antidepressants - SNRI - desvenlafaxine & venlafaxine Failed - 07/03/2022  2:51 AM      Failed - Lipid Panel in normal range within the last 12 months    Cholesterol, Total  Date Value Ref Range Status  01/03/2022 176 100 - 199 mg/dL Final   LDL Chol Calc (NIH)  Date Value Ref Range Status  01/03/2022 99 0 - 99 mg/dL Final   HDL  Date Value Ref Range Status  01/03/2022 52 >39 mg/dL Final   Triglycerides  Date Value Ref Range Status  01/03/2022 141 0 - 149 mg/dL Final         Passed - Cr in normal range and within 360 days    Creatinine, Ser  Date Value Ref Range Status  01/03/2022 0.78 0.57 - 1.00 mg/dL Final         Passed - Completed PHQ-2 or PHQ-9 in the last 360 days      Passed - Last BP in normal range    BP Readings from Last 1 Encounters:  02/07/22 133/88         Passed - Valid encounter within last 6 months    Recent Outpatient Visits           4 months ago Depression, unspecified depression type   [place] [facility] Larae Grooms, NP   6 months ago Anxiety   [place] [facility] Larae Grooms, NP   10 months ago Annual physical exam   [place] [facility] Larae Grooms, NP   1 year ago Urinary urgency   [place] [facility] Larae Grooms, NP   1 year ago Urinary urgency   [place] [facility] Larae Grooms, NP       Future Appointments             In 6 days Larae Grooms, NP  [facility]

## 2022-07-10 ENCOUNTER — Ambulatory Visit (INDEPENDENT_AMBULATORY_CARE_PROVIDER_SITE_OTHER): Payer: Medicare HMO | Admitting: Nurse Practitioner

## 2022-07-10 ENCOUNTER — Encounter: Payer: Self-pay | Admitting: Nurse Practitioner

## 2022-07-10 VITALS — BP 131/80 | HR 57 | Temp 98.3°F | Ht 62.01 in | Wt 207.6 lb

## 2022-07-10 DIAGNOSIS — E785 Hyperlipidemia, unspecified: Secondary | ICD-10-CM

## 2022-07-10 DIAGNOSIS — R7303 Prediabetes: Secondary | ICD-10-CM

## 2022-07-10 DIAGNOSIS — F419 Anxiety disorder, unspecified: Secondary | ICD-10-CM

## 2022-07-10 DIAGNOSIS — F32A Depression, unspecified: Secondary | ICD-10-CM

## 2022-07-10 MED ORDER — ROSUVASTATIN CALCIUM 40 MG PO TABS: 40.0000 mg | ORAL_TABLET | Freq: Every day | ORAL | 1 refills | Status: DC

## 2022-07-10 MED ORDER — VENLAFAXINE HCL ER 75 MG PO CP24: 75.0000 mg | ORAL_CAPSULE | Freq: Every day | ORAL | 1 refills | Status: DC

## 2022-07-10 MED ORDER — VENLAFAXINE HCL ER 150 MG PO CP24: ORAL_CAPSULE | ORAL | 1 refills | Status: DC

## 2022-07-10 MED ORDER — ROPINIROLE HCL 0.5 MG PO TABS: 0.5000 mg | ORAL_TABLET | Freq: Every day | ORAL | 1 refills | Status: DC

## 2022-07-10 NOTE — Assessment & Plan Note (Signed)
Chronic.  Controlled, Despite PHQ9 and GAD7.  Continue with current medication regimen of Effexor daily.  Refills sent.  Labs ordered today.  Return to clinic in 6 months for reevaluation.  Call sooner if concerns arise.   

## 2022-07-10 NOTE — Assessment & Plan Note (Signed)
Chronic.  Controlled.  Continue with current medication regimen of Crestor 40mg  daily.  Refills sent today.  Labs ordered today.  Return to clinic in 6 months for reevaluation.  Call sooner if concerns arise.

## 2022-07-10 NOTE — Assessment & Plan Note (Signed)
Chronic.  Controlled, Despite PHQ9 and GAD7.  Continue with current medication regimen of Effexor daily.  Refills sent.  Labs ordered today.  Return to clinic in 6 months for reevaluation.  Call sooner if concerns arise.

## 2022-07-10 NOTE — Progress Notes (Signed)
BP 131/80   Pulse (!) 57   Temp 98.3 F (36.8 C) (Oral)   Ht 5' 2.01" (1.575 m)   Wt 207 lb 9.6 oz (94.2 kg)   SpO2 96%   BMI 37.96 kg/m    Subjective:    Patient ID: Jill Burch, female    DOB: 02-23-1960, 62 y.o.   MRN: [id number]  HPI: Jill Burch is a 62 y.o. female  Chief Complaint  Patient presents with   Diabetes   Hyperlipidemia   Hypertension   Insect Bite    Stung by bee on right index finger yesterday.   DEPRESSION/ANXIETY Patient states her depression has been fair.  If she doesn't think about her son she is fine.  worse this year.  She feels like the effexor is still working well for her.  Denies SI.    Flowsheet Row Office Visit from 07/10/2022 in [facility]  PHQ-9 Total Score 12         07/10/2022    3:09 PM 02/07/2022    3:09 PM 01/03/2022    2:49 PM 08/31/2021    3:14 PM  GAD 7 : Generalized Anxiety Score  Nervous, Anxious, on Edge 1 0 2 2  Control/stop worrying 1 0 0 1  Worry too much - different things 0 1 0 1  Trouble relaxing 0 0 0 0  Restless 0 0 0 0  Easily annoyed or irritable 2 1 2 1   Afraid - awful might happen 0 1 1 2   Total GAD 7 Score 4 3 5 7   Anxiety Difficulty  Not difficult at all Somewhat difficult Not difficult at all     HYPERLIPIDEMIA Hyperlipidemia status: excellent compliance Satisfied with current treatment?  no Side effects:  no Medication compliance: excellent compliance Past cholesterol meds: rosuvastatin (crestor) and lovaza Supplements: none Aspirin:  no The 10-year ASCVD risk score (Arnett DK, et al., 2019) is: 5%   Values used to calculate the score:     Age: 21 years     Sex: Female     Is Non-Hispanic African American: No     Diabetic: No     Tobacco smoker: No     Systolic Blood Pressure: 131 mmHg     Is BP treated: No     HDL Cholesterol: 52 mg/dL     Total Cholesterol: 176 mg/dL Chest pain:  no Coronary artery disease:  no Family history CAD:  no Family history early CAD:   no  Denies HA, CP, SOB, dizziness, palpitations, visual changes, and lower extremity swelling.    Relevant past medical, surgical, family and social history reviewed and updated as indicated. Interim medical history since our last visit reviewed. Allergies and medications reviewed and updated.  Review of Systems  Eyes:  Negative for visual disturbance.  Respiratory:  Negative for cough, chest tightness and shortness of breath.   Cardiovascular:  Negative for chest pain, palpitations and leg swelling.  Neurological:  Negative for dizziness and headaches.  Psychiatric/Behavioral:  Positive for dysphoric mood. Negative for suicidal ideas. The patient is nervous/anxious.     Per HPI unless specifically indicated above     Objective:    BP 131/80   Pulse (!) 57   Temp 98.3 F (36.8 C) (Oral)   Ht 5' 2.01" (1.575 m)   Wt 207 lb 9.6 oz (94.2 kg)   SpO2 96%   BMI 37.96 kg/m   Wt Readings from Last 3 Encounters:  07/10/22  207 lb 9.6 oz (94.2 kg)  06/05/22 202 lb (91.6 kg)  02/07/22 202 lb 6.4 oz (91.8 kg)    Physical Exam Vitals and nursing note reviewed.  Constitutional:      General: She is not in acute distress.    Appearance: Normal appearance. She is obese. She is not ill-appearing, toxic-appearing or diaphoretic.  HENT:     Head: Normocephalic.     Right Ear: External ear normal.     Left Ear: External ear normal.     Nose: Nose normal.     Mouth/Throat:     Mouth: Mucous membranes are moist.     Pharynx: Oropharynx is clear.  Eyes:     General:        Right eye: No discharge.        Left eye: No discharge.     Extraocular Movements: Extraocular movements intact.     Conjunctiva/sclera: Conjunctivae normal.     Pupils: Pupils are equal, round, and reactive to light.  Cardiovascular:     Rate and Rhythm: Normal rate and regular rhythm.     Heart sounds: No murmur heard. Pulmonary:     Effort: Pulmonary effort is normal. No respiratory distress.     Breath  sounds: Normal breath sounds. No wheezing or rales.  Musculoskeletal:     Cervical back: Normal range of motion and neck supple.  Skin:    General: Skin is warm and dry.     Capillary Refill: Capillary refill takes less than 2 seconds.  Neurological:     General: No focal deficit present.     Mental Status: She is alert and oriented to person, place, and time. Mental status is at baseline.  Psychiatric:        Mood and Affect: Mood normal.        Behavior: Behavior normal.        Thought Content: Thought content normal.        Judgment: Judgment normal.     Results for orders placed or performed in visit on 02/07/22  Rapid Strep screen([facility])   Specimen: Other   Other  Result Value Ref Range   Strep Gp A Ag, IA W/Reflex Negative Negative  Novel Coronavirus, NAA ([facility])   Specimen: Nasopharyngeal(NP) swabs in vial transport medium  Result Value Ref Range   SARS-CoV-2, NAA Not Detected Not Detected  Culture, Group A Strep   Other  Result Value Ref Range   Strep A Culture Comment (A)   Veritor Flu A/B Waived  Result Value Ref Range   Influenza A Negative Negative   Influenza B Negative Negative      Assessment & Plan:   Problem List Items Addressed This Visit       Other   Anxiety - Primary    Chronic.  Controlled, Despite PHQ9 and GAD7.  Continue with current medication regimen of Effexor daily.  Refills sent.  Labs ordered today.  Return to clinic in 6 months for reevaluation.  Call sooner if concerns arise.       Relevant Medications   venlafaxine XR (EFFEXOR XR) 75 MG 24 hr capsule   venlafaxine XR (EFFEXOR-XR) 150 MG 24 hr capsule   Other Relevant Orders   Comp Met (CMET)   Depression    Chronic.  Controlled, Despite PHQ9 and GAD7.  Continue with current medication regimen of Effexor daily.  Refills sent.  Labs ordered today.  Return to clinic in 6 months for reevaluation.  Call sooner if  concerns arise.        Relevant Medications    venlafaxine XR (EFFEXOR XR) 75 MG 24 hr capsule   venlafaxine XR (EFFEXOR-XR) 150 MG 24 hr capsule   Hyperlipidemia    Chronic.  Controlled.  Continue with current medication regimen of Crestor 40mg  daily.  Refills sent today.  Labs ordered today.  Return to clinic in 6 months for reevaluation.  Call sooner if concerns arise.        Relevant Medications   rosuvastatin (CRESTOR) 40 MG tablet   Other Relevant Orders   Lipid Profile   Pre-diabetes    Labs ordered at visit today.  Will make recommendations based on lab results.        Relevant Orders   HgB A1c     Follow up plan: Return in about 6 months (around 01/09/2023).

## 2022-07-10 NOTE — Assessment & Plan Note (Signed)
Labs ordered at visit today.  Will make recommendations based on lab results.   

## 2022-07-11 LAB — COMPREHENSIVE METABOLIC PANEL
ALT: 20 IU/L (ref 0–32)
AST: 25 IU/L (ref 0–40)
Albumin/Globulin Ratio: 2 (ref 1.2–2.2)
Albumin: 4.5 g/dL (ref 3.9–4.9)
Alkaline Phosphatase: 70 IU/L (ref 44–121)
BUN/Creatinine Ratio: 16 (ref 12–28)
BUN: 16 mg/dL (ref 8–27)
Bilirubin Total: <0.2 mg/dL (ref 0.0–1.2)
CO2: 24 mmol/L (ref 20–29)
Calcium: 9.7 mg/dL (ref 8.7–10.3)
Chloride: 105 mmol/L (ref 96–106)
Creatinine, Ser: 0.98 mg/dL (ref 0.57–1.00)
Globulin, Total: 2.3 g/dL (ref 1.5–4.5)
Glucose: 84 mg/dL (ref 70–99)
Potassium: 4.4 mmol/L (ref 3.5–5.2)
Sodium: 148 mmol/L — ABNORMAL HIGH (ref 134–144)
Total Protein: 6.8 g/dL (ref 6.0–8.5)
eGFR: 64 mL/min/{1.73_m2} (ref >59)

## 2022-07-11 LAB — LIPID PANEL
Chol/HDL Ratio: 3.3 ratio (ref 0.0–4.4)
Cholesterol, Total: 163 mg/dL (ref 100–199)
HDL: 50 mg/dL (ref >39)
LDL Chol Calc (NIH): 80 mg/dL (ref 0–99)
Triglycerides: 195 mg/dL — ABNORMAL HIGH (ref 0–149)
VLDL Cholesterol Cal: 33 mg/dL (ref 5–40)

## 2022-07-11 LAB — HEMOGLOBIN A1C
Est. average glucose Bld gHb Est-mCnc: 126 mg/dL
Hgb A1c MFr Bld: 6 % — ABNORMAL HIGH (ref 4.8–5.6)

## 2022-07-11 NOTE — Progress Notes (Signed)
Please let patient know that her lab work looks good.  A1c did increase to 6.0%.  I recommend working on a low carb diet.  No other concerns at this time.  Continue with current medication regimen.  Follow up as discussed.

## 2022-07-21 ENCOUNTER — Telehealth: Payer: Self-pay | Admitting: Nurse Practitioner

## 2022-07-21 NOTE — Telephone Encounter (Signed)
Pt is requesting her medications be sent to [facility] - [place], [place] - [address] , rOPINIRole (REQUIP) 0.5 MG tablet [[id number]] rosuvastatin (CRESTOR) 40 MG tablet [[id number]] venlafaxine XR (EFFEXOR XR) 75 MG 24 hr capsule [[id number]] venlafaxine XR (EFFEXOR-XR) 150 MG 24 hr capsule [[id number]] , so they can be delivered to her because pt doesn't drive. Pt would like a follow up call when it has been sent.

## 2022-07-22 ENCOUNTER — Other Ambulatory Visit: Payer: Self-pay | Admitting: Nurse Practitioner

## 2022-07-24 MED ORDER — VENLAFAXINE HCL ER 75 MG PO CP24: 75.0000 mg | ORAL_CAPSULE | Freq: Every day | ORAL | 1 refills | Status: DC

## 2022-07-24 MED ORDER — VENLAFAXINE HCL ER 150 MG PO CP24: ORAL_CAPSULE | ORAL | 1 refills | Status: DC

## 2022-07-24 MED ORDER — ROPINIROLE HCL 0.5 MG PO TABS: 0.5000 mg | ORAL_TABLET | Freq: Every day | ORAL | 1 refills | Status: DC

## 2022-07-24 MED ORDER — ROSUVASTATIN CALCIUM 40 MG PO TABS: 40.0000 mg | ORAL_TABLET | Freq: Every day | ORAL | 1 refills | Status: DC

## 2022-07-24 NOTE — Telephone Encounter (Signed)
Medications to the pharmacy.

## 2022-07-24 NOTE — Telephone Encounter (Signed)
Called pt and informed pt that medications has been sent to mail pharmacy.

## 2022-07-24 NOTE — Telephone Encounter (Signed)
Routing to provider medications T'd up.

## 2022-07-24 NOTE — Telephone Encounter (Signed)
Requested Prescriptions  Refused Prescriptions Disp Refills   rOPINIRole (REQUIP) 0.5 MG tablet [Pharmacy Med Name: ROPINIROLE HYDROCHLORIDE 0.5 MG Tablet] 90 tablet 3    Sig: TAKE 1 TABLET AT BEDTIME     Neurology:  Parkinsonian Agents Passed - 07/22/2022 11:46 AM      Passed - Last BP in normal range    BP Readings from Last 1 Encounters:  07/10/22 131/80         Passed - Last Heart Rate in normal range    Pulse Readings from Last 1 Encounters:  07/10/22 (!) 57         Passed - Valid encounter within last 12 months    Recent Outpatient Visits           2 weeks ago Anxiety   [place] [facility] [place], Clydie Braun, NP   5 months ago Depression, unspecified depression type   [place] [facility] Larae Grooms, NP   6 months ago Anxiety   [place] Larae Grooms, NP   10 months ago Annual physical exam   [place] [facility] Larae Grooms, NP   1 year ago Urinary urgency   [place] [facility] Larae Grooms, NP       Future Appointments             In 5 months Larae Grooms, NP [facility] [facility]             venlafaxine XR (EFFEXOR-XR) 150 MG 24 hr capsule [Pharmacy Med Name: VENLAFAXINE HYDROCHLORIDEER 150 MG Capsule Extended Release 24 Hour] 90 capsule 3    Sig: TAKE 1 CAPSULE EVERY DAY WITH BREAKFAST     Psychiatry: Antidepressants - SNRI - desvenlafaxine & venlafaxine Failed - 07/22/2022 11:46 AM      Failed - Lipid Panel in normal range within the last 12 months    Cholesterol, Total  Date Value Ref Range Status  07/10/2022 163 100 - 199 mg/dL Final   LDL Chol Calc (NIH)  Date Value Ref Range Status  07/10/2022 80 0 - 99 mg/dL Final   HDL  Date Value Ref Range Status  07/10/2022 50 >39 mg/dL Final   Triglycerides  Date Value Ref Range Status  07/10/2022 195 (H) 0 - 149 mg/dL Final         Passed - Cr in normal range  and within 360 days    Creatinine, Ser  Date Value Ref Range Status  07/10/2022 0.98 0.57 - 1.00 mg/dL Final         Passed - Completed PHQ-2 or PHQ-9 in the last 360 days      Passed - Last BP in normal range    BP Readings from Last 1 Encounters:  07/10/22 131/80         Passed - Valid encounter within last 6 months    Recent Outpatient Visits           2 weeks ago Anxiety   [place] [facility] Larae Grooms, NP   5 months ago Depression, unspecified depression type   [place] [facility] Larae Grooms, NP   6 months ago Anxiety   [place] [facility] Larae Grooms, NP   10 months ago Annual physical exam   [place] [facility] Larae Grooms, NP   1 year ago Urinary urgency   [place] [facility] Larae Grooms, NP  Future Appointments             In 5 months Larae Grooms, NP [facility]

## 2022-07-31 ENCOUNTER — Other Ambulatory Visit (INDEPENDENT_AMBULATORY_CARE_PROVIDER_SITE_OTHER): Payer: Self-pay | Admitting: Nurse Practitioner

## 2022-07-31 DIAGNOSIS — I83899 Varicose veins of unspecified lower extremities with other complications: Secondary | ICD-10-CM

## 2022-08-04 ENCOUNTER — Encounter (INDEPENDENT_AMBULATORY_CARE_PROVIDER_SITE_OTHER): Payer: 59

## 2022-08-04 ENCOUNTER — Encounter (INDEPENDENT_AMBULATORY_CARE_PROVIDER_SITE_OTHER): Payer: 59 | Admitting: Vascular Surgery

## 2022-09-14 ENCOUNTER — Encounter (INDEPENDENT_AMBULATORY_CARE_PROVIDER_SITE_OTHER): Payer: 59 | Admitting: Vascular Surgery

## 2022-09-14 ENCOUNTER — Encounter (INDEPENDENT_AMBULATORY_CARE_PROVIDER_SITE_OTHER): Payer: 59

## 2022-10-11 ENCOUNTER — Ambulatory Visit (INDEPENDENT_AMBULATORY_CARE_PROVIDER_SITE_OTHER): Payer: Medicare HMO

## 2022-10-11 ENCOUNTER — Encounter (INDEPENDENT_AMBULATORY_CARE_PROVIDER_SITE_OTHER): Payer: Self-pay | Admitting: Nurse Practitioner

## 2022-10-11 ENCOUNTER — Ambulatory Visit (INDEPENDENT_AMBULATORY_CARE_PROVIDER_SITE_OTHER): Payer: Medicare HMO | Admitting: Nurse Practitioner

## 2022-10-11 VITALS — BP 117/77 | HR 63 | Resp 18 | Ht 62.0 in | Wt 206.0 lb

## 2022-10-11 DIAGNOSIS — I83813 Varicose veins of bilateral lower extremities with pain: Secondary | ICD-10-CM

## 2022-10-11 DIAGNOSIS — I890 Lymphedema, not elsewhere classified: Secondary | ICD-10-CM | POA: Diagnosis not present

## 2022-10-11 DIAGNOSIS — I83899 Varicose veins of unspecified lower extremities with other complications: Secondary | ICD-10-CM | POA: Diagnosis not present

## 2022-10-11 DIAGNOSIS — E785 Hyperlipidemia, unspecified: Secondary | ICD-10-CM | POA: Diagnosis not present

## 2022-10-11 NOTE — Progress Notes (Signed)
Subjective:    Patient ID: CHAVONNA COWIN, female    DOB: July 13, 1960, 62 y.o.   MRN: [id number] Chief Complaint  Patient presents with   New Patient (Initial Visit)    NP. LS JD 2019. reflux/consult. bleeding VV. Mercedees Stockholm is a 62 year old female who presents to [place] today for evaluation of her varicose veins.  We have previously seen the patient for varicosities of lymphedema.  She notes that she had some numbness on the varicosities in her left leg and was concerned.  She denies edema consistent with thrombophlebitis.  She notes that she has had bleeding on the varicose veins in her right foot but not in the left.  She denies any tenderness or aching of the varicosities in the left lower extremity.  She has lymphedema and says she does utilize medical grade compression daily in addition to elevation and activity.  Today noninvasive study showed no evidence of DVT or superficial thrombophlebitis bilaterally.  No evidence of deep venous insufficiency bilaterally.  No superficial venous reflux in the right but there is a small area of reflux in the left great saphenous vein.    Review of Systems  Cardiovascular:  Positive for leg swelling.  All other systems reviewed and are negative.      Objective:   Physical Exam Vitals reviewed.  HENT:     Head: Normocephalic.  Cardiovascular:     Rate and Rhythm: Normal rate.     Pulses: Normal pulses.  Pulmonary:     Effort: Pulmonary effort is normal.  Skin:    General: Skin is warm and dry.  Neurological:     Mental Status: She is alert and oriented to person, place, and time.  Psychiatric:        Mood and Affect: Mood normal.        Behavior: Behavior normal.        Thought Content: Thought content normal.        Judgment: Judgment normal.     BP 117/77 (BP Location: Left Arm)   Pulse 63   Resp 18   Ht 5\' 2"  (1.575 m)   Wt 206 lb (93.4 kg)   BMI 37.68 kg/m   Past Medical History:  Diagnosis Date   Anxiety     Depression    Hyperlipidemia    Osteoarthritis    PONV (postoperative nausea and vomiting)    Pre-diabetes     Social History   Socioeconomic History   Marital status: Married    Spouse name: Dorene Sorrow   Number of children: 1   Years of education: Not on file   Highest education level: Not on file  Occupational History   Not on file  Tobacco Use   Smoking status: Former    Current packs/day: 0.00    Types: Cigarettes    Quit date: 02/06/1990    Years since quitting: 32.6   Smokeless tobacco: Never  Vaping Use   Vaping status: Never Used  Substance and Sexual Activity   Alcohol use: No    Alcohol/week: 0.0 standard drinks of alcohol   Drug use: No   Sexual activity: Not Currently  Other Topics Concern   Not on file  Social History Narrative   Live at home with husband.   Social Determinants of Health   Financial Resource Strain: Low Risk  (06/05/2022)   Overall Financial Resource Strain (CARDIA)    Difficulty of Paying Living Expenses: Not hard at all  Food  Insecurity: No Food Insecurity (06/05/2022)   Hunger Vital Sign    Worried About Running Out of Food in the Last Year: Never true    Ran Out of Food in the Last Year: Never true  Transportation Needs: No Transportation Needs (06/05/2022)   PRAPARE - Administrator, Civil Service (Medical): No    Lack of Transportation (Non-Medical): No  Physical Activity: Insufficiently Active (06/05/2022)   Exercise Vital Sign    Days of Exercise per Week: 3 days    Minutes of Exercise per Session: 30 min  Stress: No Stress Concern Present (06/05/2022)   [employer] of Occupational Health - Occupational Stress Questionnaire    Feeling of Stress : Only a little  Social Connections: Moderately Integrated (06/05/2022)   Social Connection and Isolation Panel [NHANES]    Frequency of Communication with Friends and Family: More than three times a week    Frequency of Social Gatherings with Friends and Family: Twice a week     Attends Religious Services: More than 4 times per year    Active Member of [employer] or Organizations: No    Attends Banker Meetings: Never    Marital Status: Married  Catering manager Violence: Not At Risk (06/05/2022)   Humiliation, Afraid, Rape, and Kick questionnaire    Fear of Current or Ex-Partner: No    Emotionally Abused: No    Physically Abused: No    Sexually Abused: No    Past Surgical History:  Procedure Laterality Date   APPLICATION OF WOUND VAC  10/14/2020   Procedure: APPLICATION OF WOUND VAC;  Surgeon: Christena Flake, MD;  Location: [facility] ORS;  Service: Orthopedics;;   COLONOSCOPY WITH PROPOFOL N/A 10/13/2021   Procedure: COLONOSCOPY WITH PROPOFOL;  Surgeon: Wyline Mood, MD;  Location: [facility] ENDOSCOPY;  Service: Gastroenterology;  Laterality: N/A;   TOTAL KNEE ARTHROPLASTY Left 10/14/2020   Procedure: TOTAL KNEE ARTHROPLASTY;  Surgeon: Christena Flake, MD;  Location: [facility] ORS;  Service: Orthopedics;  Laterality: Left;   TUBAL LIGATION  1995    Family History  Problem Relation Age of Onset   Depression Mother    Arthritis Mother    Heart disease Mother    Heart attack Mother    Cancer Father        bladder   Diabetes Father    Depression Father    Hypertension Father    Diabetes Brother    COPD Neg Hx    Stroke Neg Hx     Allergies  Allergen Reactions   Lactose Diarrhea    Bloating, gas    Lactose Intolerance (Gi) Diarrhea    Bloating, gas        Latest Ref Rng & Units 08/31/2021    3:29 PM 10/16/2020    4:50 AM 10/15/2020    4:10 AM  CBC  WBC 3.4 - 10.8 x10E3/uL 5.7  8.8  11.8   Hemoglobin 11.1 - 15.9 g/dL 16.1  09.6  04.5   Hematocrit 34.0 - 46.6 % 35.8  35.0  35.2   Platelets 150 - 450 x10E3/uL 286  234  237       CMP     Component Value Date/Time   NA 148 (H) 07/10/2022 1532   K 4.4 07/10/2022 1532   CL 105 07/10/2022 1532   CO2 24 07/10/2022 1532   GLUCOSE 84 07/10/2022 1532   GLUCOSE 100 (H) 10/17/2020 0435   BUN 16 07/10/2022  1532   CREATININE 0.98 07/10/2022 1532  CALCIUM 9.7 07/10/2022 1532   PROT 6.8 07/10/2022 1532   ALBUMIN 4.5 07/10/2022 1532   AST 25 07/10/2022 1532   ALT 20 07/10/2022 1532   ALKPHOS 70 07/10/2022 1532   BILITOT <0.2 07/10/2022 1532   EGFR 64 07/10/2022 1532   GFRNONAA >60 10/17/2020 0435     No results found.     Assessment & Plan:   1. Varicose veins of bilateral lower extremities with pain Today noninvasive studies show that the patient does indeed have venous reflux in the left lower extremity.  None is noted in the right.  Following discussion with the patient for varicosities.  We discussed conservative therapy including use of medical grade compression elevation and activity which she currently does for lymphedema versus more invasive intervention such as endovenous laser ablation.  At this time the patient wishes to good continue with conservative therapy as she is not having any significant pain or issues with her varicosities and she will follow-up with [place] in 6 months or sooner if issues arise.  2. Lymphedema Recommend:  No surgery or intervention at this point in time.  I have reviewed my discussion with the patient regarding venous insufficiency and why it causes symptoms. I have discussed with the patient the chronic skin changes that accompany venous insufficiency and the long term sequela such as ulceration. Patient will contnue wearing graduated compression stockings on a daily basis, as this has provided excellent control of his edema. The patient will put the stockings on first thing in the morning and removing them in the evening. The patient is reminded not to sleep in the stockings.  In addition, behavioral modification including elevation during the day will be initiated. Exercise is strongly encouraged.  Previous duplex ultrasound of the lower extremities shows normal deep system, no significant superficial reflux was identified.  Given the patient's good  control and lack of any problems regarding the venous insufficiency and lymphedema a lymph pump in not need at this time.      3. Hyperlipidemia, unspecified hyperlipidemia type Continue statin as ordered and reviewed, no changes at this time   Current Outpatient Medications on File Prior to Visit  Medication Sig Dispense Refill   acetaminophen (TYLENOL) 500 MG tablet Take 2 tablets (1,000 mg total) by mouth every 6 (six) hours as needed. 30 tablet 0   aspirin 81 MG EC tablet Take 81 mg by mouth daily. Swallow whole.     Beta Carotene (VITAMIN A) 25000 UNIT capsule Take 25,000 Units by mouth daily.     Homeopathic Products (ARNICARE) GEL Apply topically as needed.     Lidocaine HCl (ASPERCREME LIDOCAINE) 4 % LIQD Apply topically as needed.     Multiple Vitamins-Minerals (ADULT GUMMY PO) Take 2 capsules by mouth daily.     rOPINIRole (REQUIP) 0.5 MG tablet Take 1 tablet (0.5 mg total) by mouth at bedtime. 90 tablet 1   rosuvastatin (CRESTOR) 40 MG tablet Take 1 tablet (40 mg total) by mouth daily. 90 tablet 1   venlafaxine XR (EFFEXOR XR) 75 MG 24 hr capsule Take 1 capsule (75 mg total) by mouth daily with breakfast. 90 capsule 1   venlafaxine XR (EFFEXOR-XR) 150 MG 24 hr capsule Take 1 capsule with breakast 90 capsule 1   Vitamin E 450 MG (1000 UT) CAPS Take by mouth.     No current facility-administered medications on file prior to visit.    There are no Patient Instructions on file for this visit. No follow-ups on  file.   Georgiana Spinner, NP

## 2023-01-09 ENCOUNTER — Encounter: Payer: Self-pay | Admitting: Nurse Practitioner

## 2023-01-09 ENCOUNTER — Ambulatory Visit: Payer: Medicare HMO | Admitting: Nurse Practitioner

## 2023-01-09 VITALS — BP 108/68 | HR 70 | Temp 98.0°F | Ht 62.0 in | Wt 209.8 lb

## 2023-01-09 DIAGNOSIS — Z0000 Encounter for general adult medical examination without abnormal findings: Secondary | ICD-10-CM | POA: Diagnosis not present

## 2023-01-09 DIAGNOSIS — F32A Depression, unspecified: Secondary | ICD-10-CM | POA: Diagnosis not present

## 2023-01-09 DIAGNOSIS — E785 Hyperlipidemia, unspecified: Secondary | ICD-10-CM

## 2023-01-09 DIAGNOSIS — R7303 Prediabetes: Secondary | ICD-10-CM

## 2023-01-09 DIAGNOSIS — F419 Anxiety disorder, unspecified: Secondary | ICD-10-CM | POA: Diagnosis not present

## 2023-01-09 LAB — URINALYSIS, ROUTINE W REFLEX MICROSCOPIC
Bilirubin, UA: NEGATIVE
Glucose, UA: NEGATIVE
Ketones, UA: NEGATIVE
Leukocytes,UA: NEGATIVE
Nitrite, UA: NEGATIVE
Protein,UA: NEGATIVE
RBC, UA: NEGATIVE
Specific Gravity, UA: 1.02 (ref 1.005–1.030)
Urobilinogen, Ur: 1 mg/dL (ref 0.2–1.0)
pH, UA: 7 (ref 5.0–7.5)

## 2023-01-09 MED ORDER — ROSUVASTATIN CALCIUM 40 MG PO TABS: 40.0000 mg | ORAL_TABLET | Freq: Every day | ORAL | 1 refills | Status: DC

## 2023-01-09 MED ORDER — VENLAFAXINE HCL ER 75 MG PO CP24: 75.0000 mg | ORAL_CAPSULE | Freq: Every day | ORAL | 1 refills | Status: DC

## 2023-01-09 MED ORDER — ROPINIROLE HCL 0.5 MG PO TABS: 0.5000 mg | ORAL_TABLET | Freq: Every day | ORAL | 1 refills | Status: DC

## 2023-01-09 MED ORDER — VENLAFAXINE HCL ER 150 MG PO CP24: ORAL_CAPSULE | ORAL | 1 refills | Status: DC

## 2023-01-09 NOTE — Assessment & Plan Note (Signed)
Chronic.  Controlled, Despite PHQ9 and GAD7.  Continue with current medication regimen of Effexor 150mg  and 75mg  daily.  Refills sent.  Labs ordered today.  Return to clinic in 6 months for reevaluation.  Call sooner if concerns arise.

## 2023-01-09 NOTE — Progress Notes (Signed)
BP 108/68 (BP Location: Left Arm, Patient Position: Sitting, Cuff Size: Large)   Pulse 70   Temp 98 F (36.7 C) (Oral)   Ht 5\' 2"  (1.575 m)   Wt 209 lb 12.8 oz (95.2 kg)   SpO2 96%   BMI 38.37 kg/m    Subjective:    Patient ID: Jill Burch, female    DOB: 21-Feb-1960, 62 y.o.   MRN: [id number]  HPI: Jill Burch is a 62 y.o. female presenting on 01/09/2023 for comprehensive medical examination. Current medical complaints include:none  She currently lives with: Menopausal Symptoms: no  DEPRESSION/ANXIETY Patient states her depression has been okay.  It has been worse recently with the shorter days.  She did add the second dose of Effexor on Thanksgiving and feels like it is already helping with her symptoms.  Flowsheet Row Office Visit from 07/10/2022 in [facility]  PHQ-9 Total Score 12         07/10/2022    3:09 PM 02/07/2022    3:09 PM 01/03/2022    2:49 PM 08/31/2021    3:14 PM  GAD 7 : Generalized Anxiety Score  Nervous, Anxious, on Edge 1 0 2 2  Control/stop worrying 1 0 0 1  Worry too much - different things 0 1 0 1  Trouble relaxing 0 0 0 0  Restless 0 0 0 0  Easily annoyed or irritable 2 1 2 1   Afraid - awful might happen 0 1 1 2   Total GAD 7 Score 4 3 5 7   Anxiety Difficulty  Not difficult at all Somewhat difficult Not difficult at all     HYPERLIPIDEMIA Hyperlipidemia status: excellent compliance Satisfied with current treatment?  no Side effects:  no Medication compliance: excellent compliance Past cholesterol meds: rosuvastatin (crestor) and lovaza Supplements: none Aspirin:  no The 10-year ASCVD risk score (Arnett DK, et al., 2019) is: 5%   Values used to calculate the score:     Age: 80 years     Sex: Female     Is Non-Hispanic African American: No     Diabetic: No     Tobacco smoker: No     Systolic Blood Pressure: 131 mmHg     Is BP treated: No     HDL Cholesterol: 52 mg/dL     Total Cholesterol: 176 mg/dL Chest pain:   no Coronary artery disease:  no Family history CAD:  no Family history early CAD:  no  Denies HA, CP, SOB, dizziness, palpitations, visual changes, and lower extremity swelling.  Depression Screen done today and results listed below:     01/09/2023    3:15 PM 07/10/2022    3:08 PM 06/05/2022    3:30 PM 02/07/2022    3:09 PM 01/03/2022    2:49 PM  Depression screen PHQ 2/9  Decreased Interest 2 1 1  0 2  Down, Depressed, Hopeless 2 1 1  0 2  PHQ - 2 Score 4 2 2  0 4  Altered sleeping 3 3 1 2 3   Tired, decreased energy 2 3 1 2 3   Change in appetite 2 2  2  0  Feeling bad or failure about yourself  0 1  0 1  Trouble concentrating 1 1  1  0  Moving slowly or fidgety/restless 0 0  0 0  Suicidal thoughts 0 0  0 0  PHQ-9 Score 12 12 4 7 11   Difficult doing work/chores    Not difficult at all Somewhat difficult  The patient does not have a history of falls. I did complete a risk assessment for falls. A plan of care for falls was documented.   Past Medical History:  Past Medical History:  Diagnosis Date   Anxiety    Depression    Hyperlipidemia    Osteoarthritis    PONV (postoperative nausea and vomiting)    Pre-diabetes     Surgical History:  Past Surgical History:  Procedure Laterality Date   APPLICATION OF WOUND VAC  10/14/2020   Procedure: APPLICATION OF WOUND VAC;  Surgeon: Christena Flake, MD;  Location: [facility] ORS;  Service: Orthopedics;;   COLONOSCOPY WITH PROPOFOL N/A 10/13/2021   Procedure: COLONOSCOPY WITH PROPOFOL;  Surgeon: Wyline Mood, MD;  Location: [facility] ENDOSCOPY;  Service: Gastroenterology;  Laterality: N/A;   TOTAL KNEE ARTHROPLASTY Left 10/14/2020   Procedure: TOTAL KNEE ARTHROPLASTY;  Surgeon: Christena Flake, MD;  Location: [facility] ORS;  Service: Orthopedics;  Laterality: Left;   TUBAL LIGATION  1995    Medications:  Current Outpatient Medications on File Prior to Visit  Medication Sig   acetaminophen (TYLENOL) 500 MG tablet Take 2 tablets (1,000 mg total) by mouth  every 6 (six) hours as needed.   aspirin 81 MG EC tablet Take 81 mg by mouth daily. Swallow whole.   Beta Carotene (VITAMIN A) 25000 UNIT capsule Take 25,000 Units by mouth daily.   Homeopathic Products (ARNICARE) GEL Apply topically as needed.   Lidocaine HCl (ASPERCREME LIDOCAINE) 4 % LIQD Apply topically as needed.   Multiple Vitamins-Minerals (ADULT GUMMY PO) Take 2 capsules by mouth daily.   Vitamin E 450 MG (1000 UT) CAPS Take by mouth.   No current facility-administered medications on file prior to visit.    Allergies:  Allergies  Allergen Reactions   Lactose Diarrhea    Bloating, gas    Lactose Intolerance (Gi) Diarrhea    Bloating, gas     Social History:  Social History   Socioeconomic History   Marital status: Married    Spouse name: Dorene Sorrow   Number of children: 1   Years of education: Not on file   Highest education level: Not on file  Occupational History   Not on file  Tobacco Use   Smoking status: Former    Current packs/day: 0.00    Types: Cigarettes    Quit date: 02/06/1990    Years since quitting: 32.9   Smokeless tobacco: Never  Vaping Use   Vaping status: Never Used  Substance and Sexual Activity   Alcohol use: No    Alcohol/week: 0.0 standard drinks of alcohol   Drug use: No   Sexual activity: Not Currently  Other Topics Concern   Not on file  Social History Narrative   Live at home with husband.   Social Determinants of Health   Financial Resource Strain: Low Risk  (06/05/2022)   Overall Financial Resource Strain (CARDIA)    Difficulty of Paying Living Expenses: Not hard at all  Food Insecurity: No Food Insecurity (06/05/2022)   Hunger Vital Sign    Worried About Running Out of Food in the Last Year: Never true    Ran Out of Food in the Last Year: Never true  Transportation Needs: No Transportation Needs (06/05/2022)   PRAPARE - Administrator, Civil Service (Medical): No    Lack of Transportation (Non-Medical): No  Physical  Activity: Insufficiently Active (06/05/2022)   Exercise Vital Sign    Days of Exercise per Week: 3 days  Minutes of Exercise per Session: 30 min  Stress: No Stress Concern Present (06/05/2022)   [employer] of Occupational Health - Occupational Stress Questionnaire    Feeling of Stress : Only a little  Social Connections: Moderately Integrated (06/05/2022)   Social Connection and Isolation Panel [NHANES]    Frequency of Communication with Friends and Family: More than three times a week    Frequency of Social Gatherings with Friends and Family: Twice a week    Attends Religious Services: More than 4 times per year    Active Member of [employer] or Organizations: No    Attends Banker Meetings: Never    Marital Status: Married  Catering manager Violence: Not At Risk (06/05/2022)   Humiliation, Afraid, Rape, and Kick questionnaire    Fear of Current or Ex-Partner: No    Emotionally Abused: No    Physically Abused: No    Sexually Abused: No   Social History   Tobacco Use  Smoking Status Former   Current packs/day: 0.00   Types: Cigarettes   Quit date: 02/06/1990   Years since quitting: 32.9  Smokeless Tobacco Never   Social History   Substance and Sexual Activity  Alcohol Use No   Alcohol/week: 0.0 standard drinks of alcohol    Family History:  Family History  Problem Relation Age of Onset   Depression Mother    Arthritis Mother    Heart disease Mother    Heart attack Mother    Cancer Father        bladder   Diabetes Father    Depression Father    Hypertension Father    Diabetes Brother    COPD Neg Hx    Stroke Neg Hx     Past medical history, surgical history, medications, allergies, family history and social history reviewed with patient today and changes made to appropriate areas of the chart.   Review of Systems  Eyes:  Negative for blurred vision and double vision.  Respiratory:  Negative for shortness of breath.   Cardiovascular:  Negative for  chest pain, palpitations and leg swelling.  Neurological:  Negative for dizziness and headaches.  Psychiatric/Behavioral:  Positive for depression. Negative for suicidal ideas. The patient is nervous/anxious.    All other ROS negative except what is listed above and in the HPI.      Objective:    BP 108/68 (BP Location: Left Arm, Patient Position: Sitting, Cuff Size: Large)   Pulse 70   Temp 98 F (36.7 C) (Oral)   Ht 5\' 2"  (1.575 m)   Wt 209 lb 12.8 oz (95.2 kg)   SpO2 96%   BMI 38.37 kg/m   Wt Readings from Last 3 Encounters:  01/09/23 209 lb 12.8 oz (95.2 kg)  10/11/22 206 lb (93.4 kg)  07/10/22 207 lb 9.6 oz (94.2 kg)    Physical Exam Vitals and nursing note reviewed.  Constitutional:      General: She is awake. She is not in acute distress.    Appearance: She is well-developed. She is not ill-appearing.  HENT:     Head: Normocephalic and atraumatic.     Right Ear: Hearing, tympanic membrane, ear canal and external ear normal. No drainage.     Left Ear: Hearing, tympanic membrane, ear canal and external ear normal. No drainage.     Nose: Nose normal.     Right Sinus: No maxillary sinus tenderness or frontal sinus tenderness.     Left Sinus: No maxillary sinus tenderness  or frontal sinus tenderness.     Mouth/Throat:     Mouth: Mucous membranes are moist.     Pharynx: Oropharynx is clear. Uvula midline. No pharyngeal swelling, oropharyngeal exudate or posterior oropharyngeal erythema.  Eyes:     General: Lids are normal.        Right eye: No discharge.        Left eye: No discharge.     Extraocular Movements: Extraocular movements intact.     Conjunctiva/sclera: Conjunctivae normal.     Pupils: Pupils are equal, round, and reactive to light.     Visual Fields: Right eye visual fields normal and left eye visual fields normal.  Neck:     Thyroid: No thyromegaly.     Vascular: No carotid bruit.     Trachea: Trachea normal.  Cardiovascular:     Rate and Rhythm:  Normal rate and regular rhythm.     Heart sounds: Normal heart sounds. No murmur heard.    No gallop.  Pulmonary:     Effort: Pulmonary effort is normal. No accessory muscle usage or respiratory distress.     Breath sounds: Normal breath sounds.  Chest:  Breasts:    Right: Normal.     Left: Normal.  Abdominal:     General: Bowel sounds are normal.     Palpations: Abdomen is soft. There is no hepatomegaly or splenomegaly.     Tenderness: There is no abdominal tenderness.  Musculoskeletal:        General: Normal range of motion.     Cervical back: Normal range of motion and neck supple.     Right lower leg: No edema.     Left lower leg: No edema.  Lymphadenopathy:     Head:     Right side of head: No submental, submandibular, tonsillar, preauricular or posterior auricular adenopathy.     Left side of head: No submental, submandibular, tonsillar, preauricular or posterior auricular adenopathy.     Cervical: No cervical adenopathy.     Upper Body:     Right upper body: No supraclavicular, axillary or pectoral adenopathy.     Left upper body: No supraclavicular, axillary or pectoral adenopathy.  Skin:    General: Skin is warm and dry.     Capillary Refill: Capillary refill takes less than 2 seconds.     Findings: No rash.  Neurological:     Mental Status: She is alert and oriented to person, place, and time.     Gait: Gait is intact.  Psychiatric:        Attention and Perception: Attention normal.        Mood and Affect: Mood normal.        Speech: Speech normal.        Behavior: Behavior normal. Behavior is cooperative.        Thought Content: Thought content normal.        Judgment: Judgment normal.     Results for orders placed or performed in visit on 07/10/22  Comp Met (CMET)  Result Value Ref Range   Glucose 84 70 - 99 mg/dL   BUN 16 8 - 27 mg/dL   Creatinine, Ser 1.61 0.57 - 1.00 mg/dL   eGFR 64 >09 UE/AVW/0.98   BUN/Creatinine Ratio 16 12 - 28   Sodium 148 (H)  134 - 144 mmol/L   Potassium 4.4 3.5 - 5.2 mmol/L   Chloride 105 96 - 106 mmol/L   CO2 24 20 - 29 mmol/L  Calcium 9.7 8.7 - 10.3 mg/dL   Total Protein 6.8 6.0 - 8.5 g/dL   Albumin 4.5 3.9 - 4.9 g/dL   Globulin, Total 2.3 1.5 - 4.5 g/dL   Albumin/Globulin Ratio 2.0 1.2 - 2.2   Bilirubin Total <0.2 0.0 - 1.2 mg/dL   Alkaline Phosphatase 70 44 - 121 IU/L   AST 25 0 - 40 IU/L   ALT 20 0 - 32 IU/L  Lipid Profile  Result Value Ref Range   Cholesterol, Total 163 100 - 199 mg/dL   Triglycerides 782 (H) 0 - 149 mg/dL   HDL 50 >95 mg/dL   VLDL Cholesterol Cal 33 5 - 40 mg/dL   LDL Chol Calc (NIH) 80 0 - 99 mg/dL   Chol/HDL Ratio 3.3 0.0 - 4.4 ratio  HgB A1c  Result Value Ref Range   Hgb A1c MFr Bld 6.0 (H) 4.8 - 5.6 %   Est. average glucose Bld gHb Est-mCnc 126 mg/dL      Assessment & Plan:   Problem List Items Addressed This Visit       Other   Anxiety    Chronic.  Controlled, Despite PHQ9 and GAD7.  Continue with current medication regimen of Effexor 150mg  and 75mg  daily.  Refills sent.  Labs ordered today.  Return to clinic in 6 months for reevaluation.  Call sooner if concerns arise.       Relevant Medications   venlafaxine XR (EFFEXOR-XR) 150 MG 24 hr capsule   venlafaxine XR (EFFEXOR XR) 75 MG 24 hr capsule   Depression    Chronic.  Controlled, Despite PHQ9 and GAD7.  Continue with current medication regimen of Effexor 150mg  and 75mg  daily.  Refills sent.  Labs ordered today.  Return to clinic in 6 months for reevaluation.  Call sooner if concerns arise.       Relevant Medications   venlafaxine XR (EFFEXOR-XR) 150 MG 24 hr capsule   venlafaxine XR (EFFEXOR XR) 75 MG 24 hr capsule   Hyperlipidemia    Chronic.  Controlled.  Continue with current medication regimen of Rosuvastatin daily.  Labs ordered today.  Return to clinic in 6 months for reevaluation.  Call sooner if concerns arise.        Relevant Medications   rosuvastatin (CRESTOR) 40 MG tablet   Other Relevant  Orders   Lipid panel   Pre-diabetes   Relevant Orders   HgB A1c   Other Visit Diagnoses     Annual physical exam    -  Primary   Health maintenance reviewed during visit today.  Labs ordered.  Vaccines up to date.  Needs a mammogram but doesn't drive. Colon screening up to date.   Relevant Orders   CBC with Differential/Platelet   Comprehensive metabolic panel   Lipid panel   TSH   Urinalysis, Routine w reflex microscopic   HgB A1c        Follow up plan: Return in about 6 months (around 07/10/2023) for HTN, HLD, DM2 FU.   LABORATORY TESTING:  - Pap smear: up to date  IMMUNIZATIONS:   - Tdap: Tetanus vaccination status reviewed: last tetanus booster within 10 years. - Influenza: Up to date - Pneumovax: Up to date - Prevnar: Up to date - COVID: Not applicable - HPV: Not applicable - Shingrix vaccine: Up to date  SCREENING: -Mammogram:  needs updated- but doesn't driver   - Colonoscopy: Up to date  - Bone Density: Not applicable  -Hearing Test: Not applicable  -Spirometry:  Not applicable   PATIENT COUNSELING:   Advised to take 1 mg of folate supplement per day if capable of pregnancy.   Sexuality: Discussed sexually transmitted diseases, partner selection, use of condoms, avoidance of unintended pregnancy  and contraceptive alternatives.   Advised to avoid cigarette smoking.  I discussed with the patient that most people either abstain from alcohol or drink within safe limits (<=14/week and <=4 drinks/occasion for males, <=7/weeks and <= 3 drinks/occasion for females) and that the risk for alcohol disorders and other health effects rises proportionally with the number of drinks per week and how often a drinker exceeds daily limits.  Discussed cessation/primary prevention of drug use and availability of treatment for abuse.   Diet: Encouraged to adjust caloric intake to maintain  or achieve ideal body weight, to reduce intake of dietary saturated fat and total fat, to  limit sodium intake by avoiding high sodium foods and not adding table salt, and to maintain adequate dietary potassium and calcium preferably from fresh fruits, vegetables, and low-fat dairy products.    stressed the importance of regular exercise  Injury prevention: Discussed safety belts, safety helmets, smoke detector, smoking near bedding or upholstery.   Dental health: Discussed importance of regular tooth brushing, flossing, and dental visits.    NEXT PREVENTATIVE PHYSICAL DUE IN 1 YEAR. Return in about 6 months (around 07/10/2023) for HTN, HLD, DM2 FU.

## 2023-01-09 NOTE — Assessment & Plan Note (Signed)
Chronic.  Controlled.  Continue with current medication regimen of Rosuvastatin daily.  Labs ordered today.  Return to clinic in 6 months for reevaluation.  Call sooner if concerns arise.   

## 2023-01-10 LAB — LIPID PANEL
Chol/HDL Ratio: 3.5 {ratio} (ref 0.0–4.4)
Cholesterol, Total: 173 mg/dL (ref 100–199)
HDL: 50 mg/dL (ref >39)
LDL Chol Calc (NIH): 82 mg/dL (ref 0–99)
Triglycerides: 246 mg/dL — ABNORMAL HIGH (ref 0–149)
VLDL Cholesterol Cal: 41 mg/dL — ABNORMAL HIGH (ref 5–40)

## 2023-01-10 LAB — COMPREHENSIVE METABOLIC PANEL
ALT: 16 [IU]/L (ref 0–32)
AST: 19 [IU]/L (ref 0–40)
Albumin: 4.3 g/dL (ref 3.9–4.9)
Alkaline Phosphatase: 80 [IU]/L (ref 44–121)
BUN/Creatinine Ratio: 25 (ref 12–28)
BUN: 17 mg/dL (ref 8–27)
Bilirubin Total: <0.2 mg/dL (ref 0.0–1.2)
CO2: 27 mmol/L (ref 20–29)
Calcium: 9.6 mg/dL (ref 8.7–10.3)
Chloride: 101 mmol/L (ref 96–106)
Creatinine, Ser: 0.68 mg/dL (ref 0.57–1.00)
Globulin, Total: 2.6 g/dL (ref 1.5–4.5)
Glucose: 76 mg/dL (ref 70–99)
Potassium: 4.5 mmol/L (ref 3.5–5.2)
Sodium: 143 mmol/L (ref 134–144)
Total Protein: 6.9 g/dL (ref 6.0–8.5)
eGFR: 97 mL/min/{1.73_m2} (ref >59)

## 2023-01-10 LAB — CBC WITH DIFFERENTIAL/PLATELET
Basophils Absolute: 0 10*3/uL (ref 0.0–0.2)
Basos: 1 %
EOS (ABSOLUTE): 0.1 10*3/uL (ref 0.0–0.4)
Eos: 1 %
Hematocrit: 40.2 % (ref 34.0–46.6)
Hemoglobin: 13 g/dL (ref 11.1–15.9)
Immature Grans (Abs): 0 10*3/uL (ref 0.0–0.1)
Immature Granulocytes: 0 %
Lymphocytes Absolute: 2.1 10*3/uL (ref 0.7–3.1)
Lymphs: 26 %
MCH: 30.2 pg (ref 26.6–33.0)
MCHC: 32.3 g/dL (ref 31.5–35.7)
MCV: 94 fL (ref 79–97)
Monocytes Absolute: 0.4 10*3/uL (ref 0.1–0.9)
Monocytes: 6 %
Neutrophils Absolute: 5.2 10*3/uL (ref 1.4–7.0)
Neutrophils: 66 %
Platelets: 290 10*3/uL (ref 150–450)
RBC: 4.3 x10E6/uL (ref 3.77–5.28)
RDW: 12.6 % (ref 11.7–15.4)
WBC: 7.9 10*3/uL (ref 3.4–10.8)

## 2023-01-10 LAB — HEMOGLOBIN A1C
Est. average glucose Bld gHb Est-mCnc: 120 mg/dL
Hgb A1c MFr Bld: 5.8 % — ABNORMAL HIGH (ref 4.8–5.6)

## 2023-01-10 LAB — TSH: TSH: 2.43 u[IU]/mL (ref 0.450–4.500)

## 2023-02-21 ENCOUNTER — Other Ambulatory Visit: Payer: Self-pay | Admitting: Nurse Practitioner

## 2023-02-22 NOTE — Telephone Encounter (Signed)
Requested Prescriptions  Pending Prescriptions Disp Refills   rOPINIRole (REQUIP) 0.5 MG tablet [Pharmacy Med Name: rOPINIRole HCl Oral Tablet 0.5 MG] 90 tablet 3    Sig: TAKE 1 TABLET AT BEDTIME     Neurology:  Parkinsonian Agents Passed - 02/22/2023  8:23 AM      Passed - Last BP in normal range    BP Readings from Last 1 Encounters:  01/09/23 108/68         Passed - Last Heart Rate in normal range    Pulse Readings from Last 1 Encounters:  01/09/23 70         Passed - Valid encounter within last 12 months    Recent Outpatient Visits           1 month ago Annual physical exam   [place] [facility] Larae Grooms, NP   7 months ago Anxiety   [place] [facility] Larae Grooms, NP   1 year ago Depression, unspecified depression type   [place] [facility] Larae Grooms, NP   1 year ago Anxiety   [place] [facility] Larae Grooms, NP   1 year ago Annual physical exam   [place] [facility] Larae Grooms, NP       Future Appointments             In 4 months Larae Grooms, NP [place] [facility]             venlafaxine XR (EFFEXOR-XR) 150 MG 24 hr capsule [Pharmacy Med Name: Venlafaxine HCl ER Oral Capsule Extended Release 24 Hour 150 MG] 90 capsule 1    Sig: TAKE 1 CAPSULE WITH BREAKAST     Psychiatry: Antidepressants - SNRI - desvenlafaxine & venlafaxine Failed - 02/22/2023  8:23 AM      Failed - Lipid Panel in normal range within the last 12 months    Cholesterol, Total  Date Value Ref Range Status  01/09/2023 173 100 - 199 mg/dL Final   LDL Chol Calc (NIH)  Date Value Ref Range Status  01/09/2023 82 0 - 99 mg/dL Final   HDL  Date Value Ref Range Status  01/09/2023 50 >39 mg/dL Final   Triglycerides  Date Value Ref Range Status  01/09/2023 246 (H) 0 - 149 mg/dL Final         Passed - Cr in normal range and within 360 days     Creatinine, Ser  Date Value Ref Range Status  01/09/2023 0.68 0.57 - 1.00 mg/dL Final         Passed - Completed PHQ-2 or PHQ-9 in the last 360 days      Passed - Last BP in normal range    BP Readings from Last 1 Encounters:  01/09/23 108/68         Passed - Valid encounter within last 6 months    Recent Outpatient Visits           1 month ago Annual physical exam   [place] [facility] Larae Grooms, NP   7 months ago Anxiety   [place] [facility] Larae Grooms, NP   1 year ago Depression, unspecified depression type   [place] [facility] Larae Grooms, NP   1 year ago Anxiety   [place] [facility] Larae Grooms, NP   1 year ago Annual physical exam   [place] [facility] Larae Grooms,  NP       Future Appointments             In 4 months Larae Grooms, NP [place] [facility]             venlafaxine XR (EFFEXOR-XR) 75 MG 24 hr capsule [Pharmacy Med Name: Venlafaxine HCl ER Oral Capsule Extended Release 24 Hour 75 MG] 90 capsule 1    Sig: TAKE 1 CAPSULE EVERY DAY WITH BREAKFAST (TAKE WITH THE 150MG )     Psychiatry: Antidepressants - SNRI - desvenlafaxine & venlafaxine Failed - 02/22/2023  8:23 AM      Failed - Lipid Panel in normal range within the last 12 months    Cholesterol, Total  Date Value Ref Range Status  01/09/2023 173 100 - 199 mg/dL Final   LDL Chol Calc (NIH)  Date Value Ref Range Status  01/09/2023 82 0 - 99 mg/dL Final   HDL  Date Value Ref Range Status  01/09/2023 50 >39 mg/dL Final   Triglycerides  Date Value Ref Range Status  01/09/2023 246 (H) 0 - 149 mg/dL Final         Passed - Cr in normal range and within 360 days    Creatinine, Ser  Date Value Ref Range Status  01/09/2023 0.68 0.57 - 1.00 mg/dL Final         Passed - Completed PHQ-2 or PHQ-9 in the last 360 days      Passed - Last BP in normal  range    BP Readings from Last 1 Encounters:  01/09/23 108/68         Passed - Valid encounter within last 6 months    Recent Outpatient Visits           1 month ago Annual physical exam   [place] [facility] Larae Grooms, NP   7 months ago Anxiety   [place] [facility] Larae Grooms, NP   1 year ago Depression, unspecified depression type   [place] [facility] Larae Grooms, NP   1 year ago Anxiety   [place] [facility] Larae Grooms, NP   1 year ago Annual physical exam   [place] [facility] Larae Grooms, NP       Future Appointments             In 4 months Larae Grooms, NP [place] [facility]

## 2023-04-10 ENCOUNTER — Encounter (INDEPENDENT_AMBULATORY_CARE_PROVIDER_SITE_OTHER): Payer: Self-pay | Admitting: Vascular Surgery

## 2023-04-10 ENCOUNTER — Ambulatory Visit (INDEPENDENT_AMBULATORY_CARE_PROVIDER_SITE_OTHER): Payer: Medicare HMO | Admitting: Vascular Surgery

## 2023-04-10 VITALS — BP 130/83 | HR 62 | Resp 18 | Ht 62.0 in | Wt 215.0 lb

## 2023-04-10 DIAGNOSIS — E785 Hyperlipidemia, unspecified: Secondary | ICD-10-CM

## 2023-04-10 DIAGNOSIS — R7303 Prediabetes: Secondary | ICD-10-CM

## 2023-04-10 DIAGNOSIS — I83813 Varicose veins of bilateral lower extremities with pain: Secondary | ICD-10-CM | POA: Diagnosis not present

## 2023-04-10 NOTE — Progress Notes (Signed)
 MRN : [id number]  Jill Burch is a 63 y.o. (April 27, 1960) female who presents with chief complaint of  Chief Complaint  Patient presents with   Follow-up    6 month follow up no studies  .  History of Present Illness: Patient returns today in follow up of her venous insufficiency.  Since her last visit 6 months ago, she has been diligently wearing compression socks, elevating her legs, and using anti-inflammatories as needed for discomfort.  Overall, her legs seem to be stable from 6 months ago and significantly better than what they were when we initially saw her.  No new wounds or ulceration.  No fevers or chills.  No chest pain or shortness of breath.  She denies any weeping or breakdown of the skin.  Current Outpatient Medications  Medication Sig Dispense Refill   acetaminophen (TYLENOL) 500 MG tablet Take 2 tablets (1,000 mg total) by mouth every 6 (six) hours as needed. 30 tablet 0   aspirin 81 MG EC tablet Take 81 mg by mouth daily. Swallow whole.     Beta Carotene (VITAMIN A) 25000 UNIT capsule Take 25,000 Units by mouth daily.     Homeopathic Products (ARNICARE) GEL Apply topically as needed.     Lidocaine HCl (ASPERCREME LIDOCAINE) 4 % LIQD Apply topically as needed.     Multiple Vitamins-Minerals (ADULT GUMMY PO) Take 2 capsules by mouth daily.     rosuvastatin (CRESTOR) 40 MG tablet Take 1 tablet (40 mg total) by mouth daily. 90 tablet 1   venlafaxine XR (EFFEXOR-XR) 150 MG 24 hr capsule TAKE 1 CAPSULE WITH BREAKAST 90 capsule 1   Vitamin E 450 MG (1000 UT) CAPS Take by mouth.     rOPINIRole (REQUIP) 0.5 MG tablet TAKE 1 TABLET AT BEDTIME (Patient not taking: Reported on 04/10/2023) 90 tablet 3   venlafaxine XR (EFFEXOR-XR) 75 MG 24 hr capsule TAKE 1 CAPSULE EVERY DAY WITH BREAKFAST (TAKE WITH THE 150MG ) (Patient not taking: Reported on 04/10/2023) 90 capsule 1   No current facility-administered medications for this visit.    Past Medical History:  Diagnosis Date   Anxiety     Depression    Hyperlipidemia    Osteoarthritis    PONV (postoperative nausea and vomiting)    Pre-diabetes     Past Surgical History:  Procedure Laterality Date   APPLICATION OF WOUND VAC  10/14/2020   Procedure: APPLICATION OF WOUND VAC;  Surgeon: Christena Flake, MD;  Location: [facility] ORS;  Service: Orthopedics;;   COLONOSCOPY WITH PROPOFOL N/A 10/13/2021   Procedure: COLONOSCOPY WITH PROPOFOL;  Surgeon: Wyline Mood, MD;  Location: [facility] ENDOSCOPY;  Service: Gastroenterology;  Laterality: N/A;   TOTAL KNEE ARTHROPLASTY Left 10/14/2020   Procedure: TOTAL KNEE ARTHROPLASTY;  Surgeon: Christena Flake, MD;  Location: [facility] ORS;  Service: Orthopedics;  Laterality: Left;   TUBAL LIGATION  1995     Social History   Tobacco Use   Smoking status: Former    Current packs/day: 0.00    Types: Cigarettes    Quit date: 02/06/1990    Years since quitting: 33.1   Smokeless tobacco: Never  Vaping Use   Vaping status: Never Used  Substance Use Topics   Alcohol use: No    Alcohol/week: 0.0 standard drinks of alcohol   Drug use: No       Family History  Problem Relation Age of Onset   Depression Mother    Arthritis Mother    Heart disease Mother  Heart attack Mother    Cancer Father        bladder   Diabetes Father    Depression Father    Hypertension Father    Diabetes Brother    COPD Neg Hx    Stroke Neg Hx      Allergies  Allergen Reactions   Lactose Diarrhea    Bloating, gas    Lactose Intolerance (Gi) Diarrhea    Bloating, gas      REVIEW OF SYSTEMS (Negative unless checked)  Constitutional: [] Weight loss  [] Fever  [] Chills Cardiac: [] Chest pain   [] Chest pressure   [] Palpitations   [] Shortness of breath when laying flat   [] Shortness of breath at rest   [] Shortness of breath with exertion. Vascular:  [] Pain in legs with walking   [] Pain in legs at rest   [] Pain in legs when laying flat   [] Claudication   [] Pain in feet when walking  [] Pain in feet at rest  [] Pain in feet  when laying flat   [] History of DVT   [] Phlebitis   [x] Swelling in legs   [x] Varicose veins   [] Non-healing ulcers Pulmonary:   [] Uses home oxygen   [] Productive cough   [] Hemoptysis   [] Wheeze  [] COPD   [] Asthma Neurologic:  [] Dizziness  [] Blackouts   [] Seizures   [] History of stroke   [] History of TIA  [] Aphasia   [] Temporary blindness   [] Dysphagia   [] Weakness or numbness in arms   [] Weakness or numbness in legs Musculoskeletal:  [x] Arthritis   [] Joint swelling   [] Joint pain   [] Low back pain Hematologic:  [] Easy bruising  [] Easy bleeding   [] Hypercoagulable state   [] Anemic   Gastrointestinal:  [] Blood in stool   [] Vomiting blood  [] Gastroesophageal reflux/heartburn   [] Abdominal pain Genitourinary:  [] Chronic kidney disease   [] Difficult urination  [] Frequent urination  [] Burning with urination   [] Hematuria Skin:  [] Rashes   [] Ulcers   [] Wounds Psychological:  [x] History of anxiety   [x]  History of major depression.  Physical Examination  BP 130/83   Pulse 62   Resp 18   Ht 5\' 2"  (1.575 m)   Wt 215 lb (97.5 kg)   BMI 39.32 kg/m  Gen:  WD/WN, NAD. Obese. Appears younger than stated age. Head: [place]/AT, No temporalis wasting. Ear/Nose/Throat: Hearing grossly intact, nares w/o erythema or drainage Eyes: Conjunctiva clear. Sclera non-icteric Neck: Supple.  Trachea midline Pulmonary:  Good air movement, no use of accessory muscles.  Cardiac: RRR, no JVD Vascular:  Vessel Right Left  Radial Palpable Palpable           Musculoskeletal: M/S 5/5 throughout.  No deformity or atrophy. Mild BLE edema. Neurologic: Sensation grossly intact in extremities.  Symmetrical.  Speech is fluent.  Psychiatric: Judgment intact, Mood & affect appropriate for pt's clinical situation. Dermatologic: No rashes or ulcers noted.  No cellulitis or open wounds.      Labs No results found for this or any previous visit (from the past 2160 hours).  Radiology No results  found.  Assessment/Plan  Varicose veins of bilateral lower extremities with pain Patient has known saphenous vein reflux, but has continued to have good results from conservative therapy and has not required intervention at this point.  She does not want to have intervention and would like to avoid that if possible.  I have told her as long as her symptoms remain mild and not lifestyle limiting, that is very reasonable.  She does need to continue all of  her conservative therapy such as compression socks, elevation, and exercise.  The patient voices her understanding.  I will plan to see her back unless her symptoms worsen in the interim.  Hyperlipidemia lipid control important in reducing the progression of atherosclerotic disease. Continue statin therapy   Pre-diabetes blood glucose control important in reducing the progression of atherosclerotic disease. Also, involved in wound healing. Not on meds    Festus Barren, MD  04/10/2023 2:55 PM    This note was created with Dragon medical transcription system.  Any errors from dictation are purely unintentional

## 2023-04-10 NOTE — Assessment & Plan Note (Signed)
blood glucose control important in reducing the progression of atherosclerotic disease. Also, involved in wound healing. Not on meds

## 2023-04-10 NOTE — Assessment & Plan Note (Signed)
 Patient has known saphenous vein reflux, but has continued to have good results from conservative therapy and has not required intervention at this point.  She does not want to have intervention and would like to avoid that if possible.  I have told her as long as her symptoms remain mild and not lifestyle limiting, that is very reasonable.  She does need to continue all of her conservative therapy such as compression socks, elevation, and exercise.  The patient voices her understanding.  I will plan to see her back unless her symptoms worsen in the interim.

## 2023-04-10 NOTE — Assessment & Plan Note (Signed)
 lipid control important in reducing the progression of atherosclerotic disease. Continue statin therapy

## 2023-05-27 IMAGING — CT CT KNEE*L* W/O CM
3 of 5 series · 10 of 33 positions shown, 12 images · non-contrast
Comparison: Left knee x-rays dated August 08, 2015.

CLINICAL DATA: Chronic left knee pain.

EXAM:
CT OF THE LEFT KNEE WITHOUT CONTRAST
TECHNIQUE: Multidetector CT imaging of the left knee was performed according to
the standard protocol. Multiplanar CT image reconstructions were
also generated.

[Series 9: axial st · axial · 0.49mm/px · z∈[-600,-458]mm · 2 of 285 slices shown, 3 images]
[im 72/285  soft-tissue]
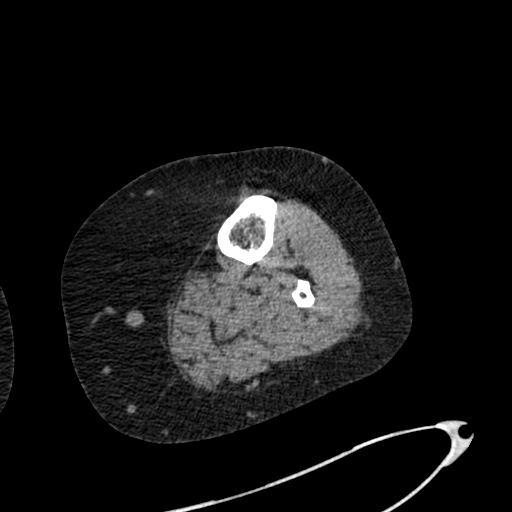
[im 72/285  bone]
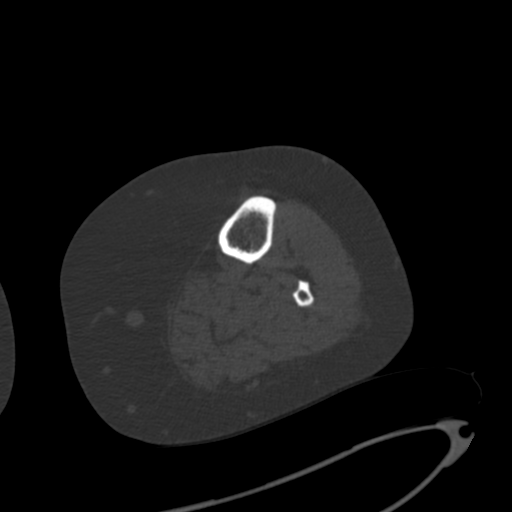
[im 214/285  bone]
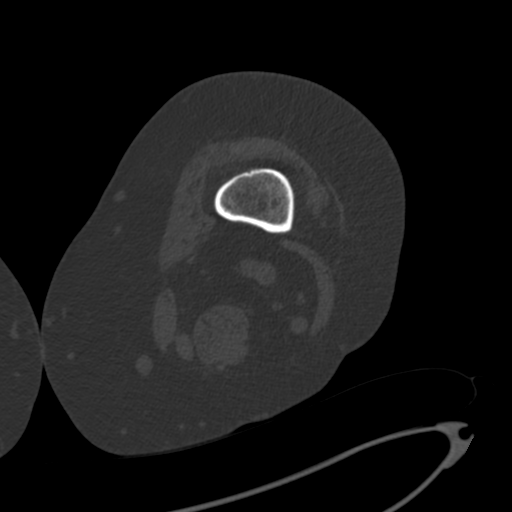

[Series 12: coronal st · coronal · 0.39mm/px · 3 of 73 slices shown]
[im 15/73  bone]
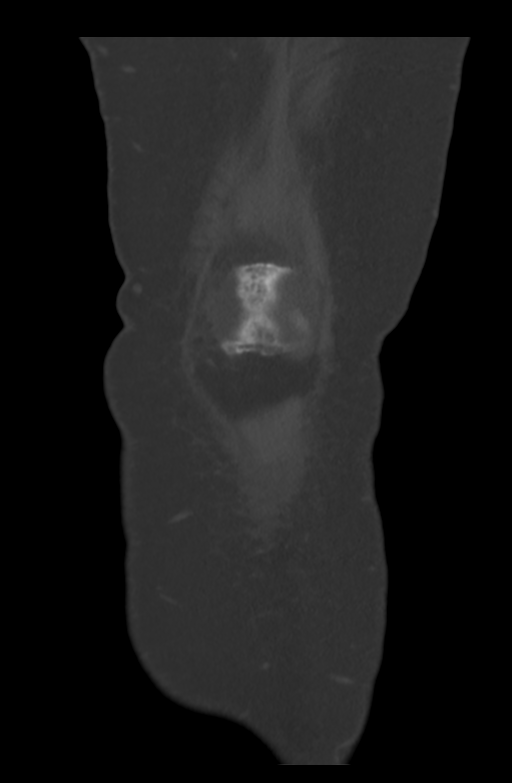
[im 29/73  bone]
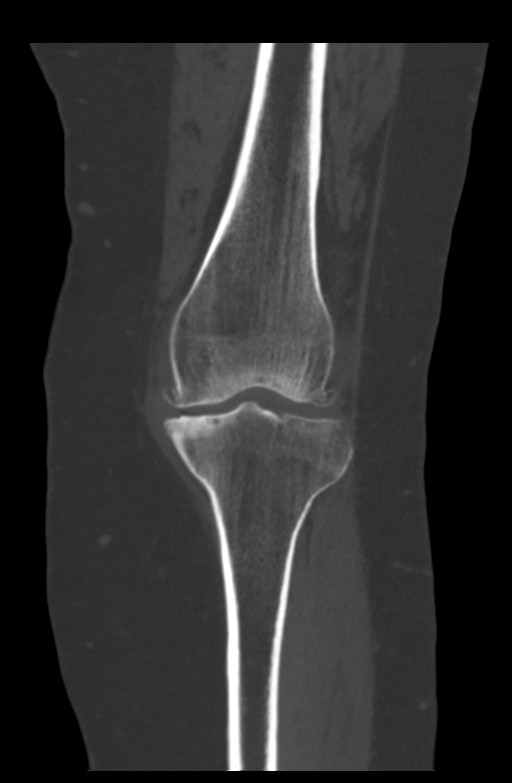
[im 44/73  bone]
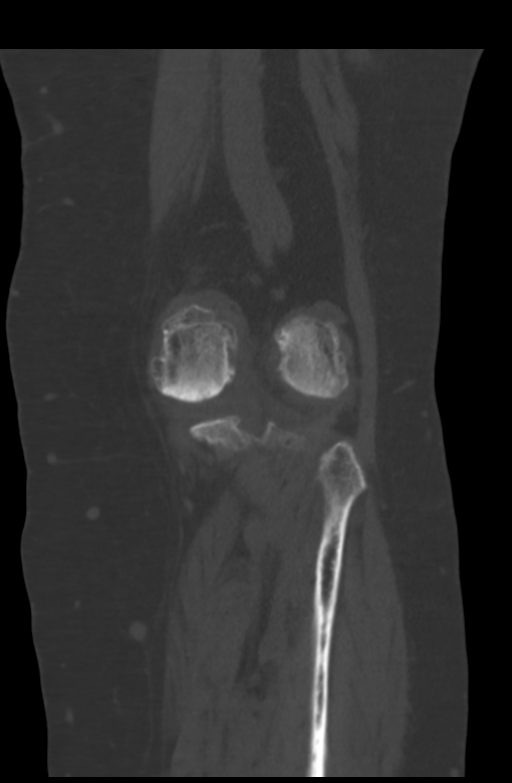

[Series 13: sagittal st · sagittal · 0.28mm/px · 5 of 102 slices shown, 6 images]
[im 34/102  bone]
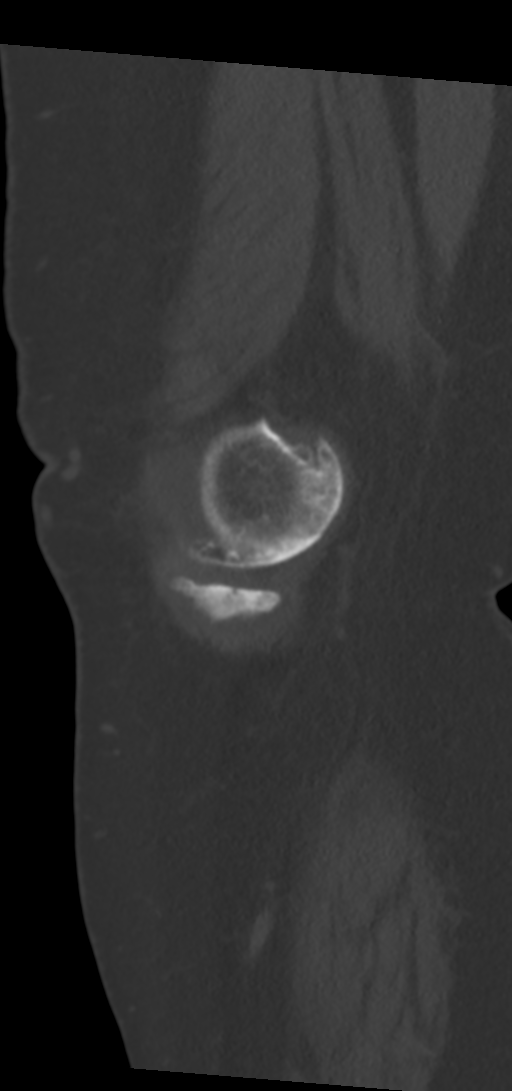
[im 43/102  bone]
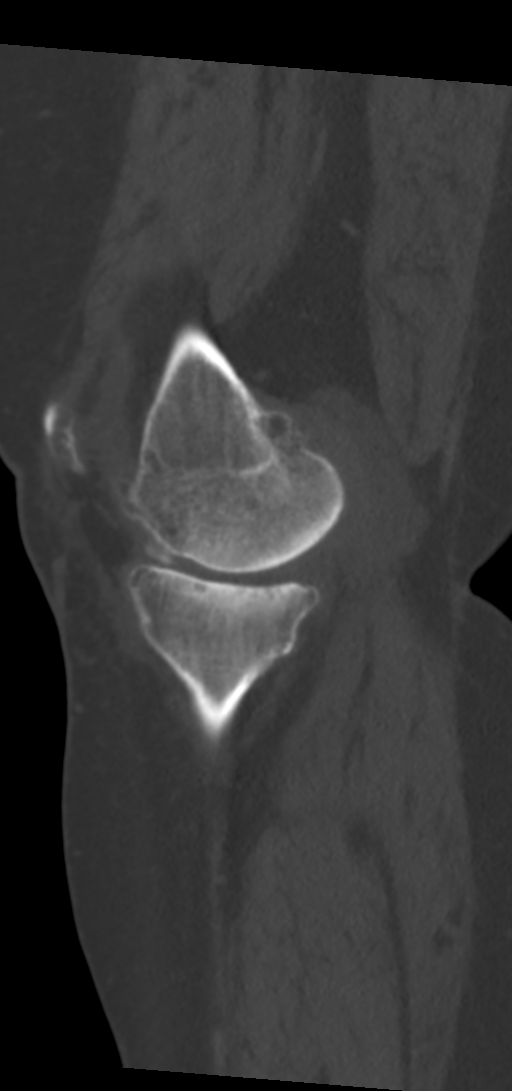
[im 51/102  soft-tissue]
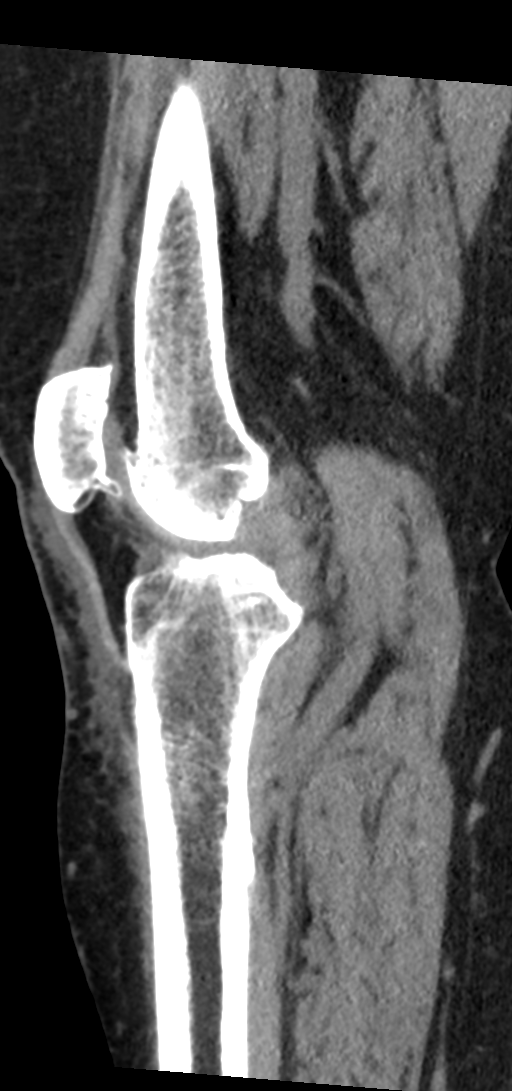
[im 51/102  bone]
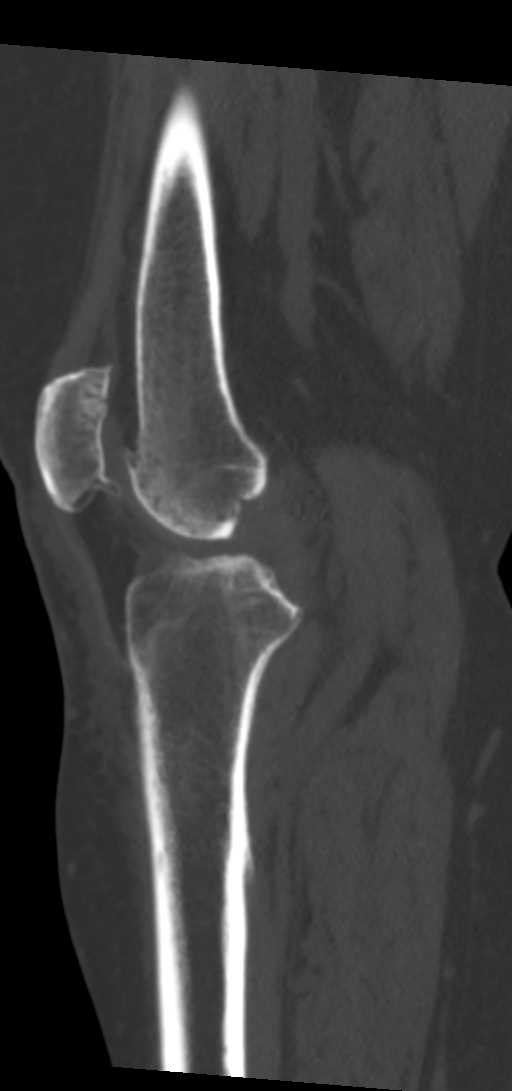
[im 59/102  bone]
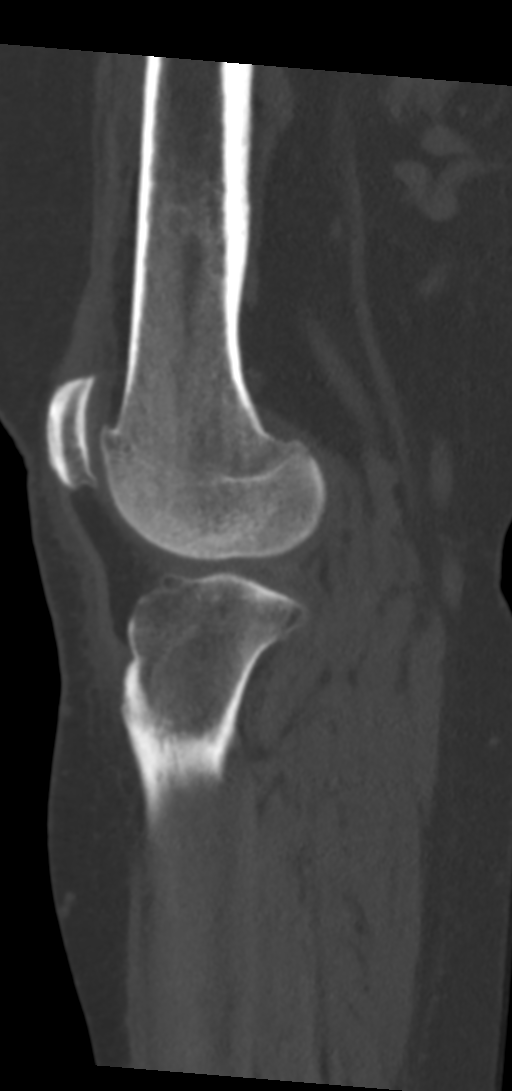
[im 68/102  bone]
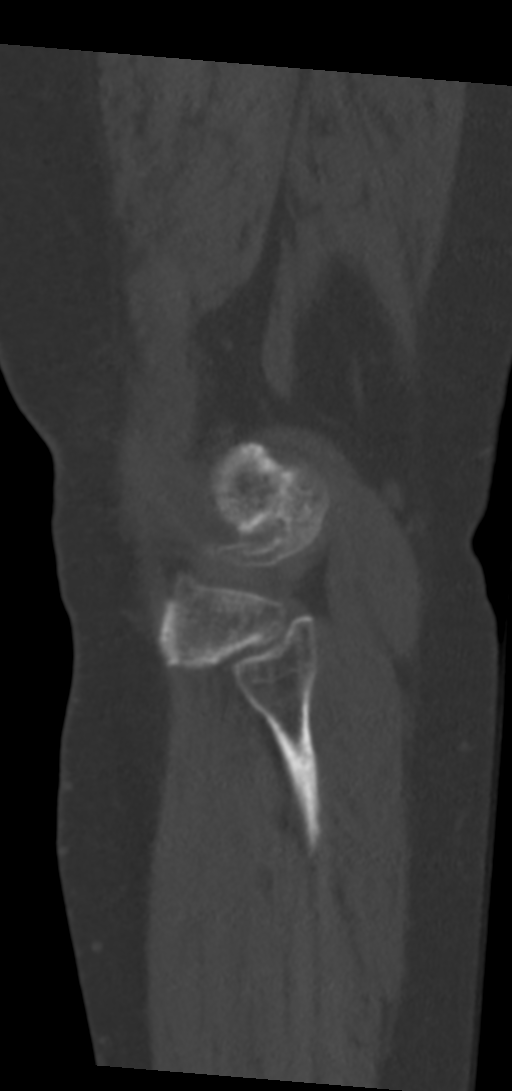

[10 of 33 positions shown; findings below may reference images not displayed]

FINDINGS: Bones/Joint/Cartilage

The hip demonstrates no fracture or dislocation. There is no lytic
or blastic lesion. Mild degenerative changes with small subchondral
cysts in the posterior acetabulum.

The knee demonstrates no fracture or dislocation. There is no lytic
or blastic lesion. Moderate medial and patellofemoral compartment
joint space narrowing. Large tricompartmental marginal osteophytes.
Trace joint effusion.

The ankle demonstrates no fracture or dislocation. There is no lytic
or blastic lesion. Mild midfoot osteoarthritis with subchondral cyst
in the second and third metatarsal bases.

Ligaments

Ligaments are suboptimally evaluated by CT.

Muscles and Tendons
Grossly intact.

Soft tissue
No fluid collection or hematoma. No soft tissue mass. Venous
varicosities.
IMPRESSION: 1. Tricompartmental osteoarthritis, moderate in the medial and
patellofemoral compartments.

## 2023-05-30 ENCOUNTER — Other Ambulatory Visit: Payer: Self-pay | Admitting: Nurse Practitioner

## 2023-05-30 NOTE — Telephone Encounter (Signed)
 Too soon for refill, last refill 01/09/23 for 90 and 1 refill.  Requested Prescriptions  Pending Prescriptions Disp Refills   rosuvastatin  (CRESTOR ) 40 MG tablet [Pharmacy Med Name: Rosuvastatin  Calcium  Oral Tablet 40 MG] 90 tablet 3    Sig: TAKE 1 TABLET EVERY DAY     Cardiovascular:  Antilipid - Statins 2 Failed - 05/30/2023  3:58 PM      Failed - Valid encounter within last 12 months    Recent Outpatient Visits   None     Future Appointments             In 1 month Aileen Alexanders, NP [facility]            Failed - Lipid Panel in normal range within the last 12 months    Cholesterol, Total  Date Value Ref Range Status  01/09/2023 173 100 - 199 mg/dL Final   LDL Chol Calc (NIH)  Date Value Ref Range Status  01/09/2023 82 0 - 99 mg/dL Final   HDL  Date Value Ref Range Status  01/09/2023 50 >39 mg/dL Final   Triglycerides  Date Value Ref Range Status  01/09/2023 246 (H) 0 - 149 mg/dL Final         Passed - Cr in normal range and within 360 days    Creatinine, Ser  Date Value Ref Range Status  01/09/2023 0.68 0.57 - 1.00 mg/dL Final         Passed - Patient is not pregnant

## 2023-06-01 ENCOUNTER — Ambulatory Visit: Payer: Self-pay

## 2023-06-01 ENCOUNTER — Ambulatory Visit (INDEPENDENT_AMBULATORY_CARE_PROVIDER_SITE_OTHER): Admitting: Family Medicine

## 2023-06-01 ENCOUNTER — Encounter: Payer: Self-pay | Admitting: Family Medicine

## 2023-06-01 VITALS — BP 139/82 | HR 67 | Ht 62.0 in | Wt 214.0 lb

## 2023-06-01 DIAGNOSIS — L237 Allergic contact dermatitis due to plants, except food: Secondary | ICD-10-CM | POA: Diagnosis not present

## 2023-06-01 MED ORDER — PREDNISONE 10 MG PO TABS: ORAL_TABLET | ORAL | 0 refills | Status: DC

## 2023-06-01 NOTE — Progress Notes (Signed)
 BP 139/82 (BP Location: Left Arm, Patient Position: Sitting, Cuff Size: Large)   Pulse 67   Ht 5\' 2"  (1.575 m)   Wt 214 lb (97.1 kg)   BMI 39.14 kg/m    Subjective:    Patient ID: Jill Burch, female    DOB: 1961/01/08, 63 y.o.   MRN: [id number]  HPI: Jill Burch is a 63 y.o. female  Chief Complaint  Patient presents with   Poison Ivy   RASH Duration:  3 days ago  Location: arms, and belly  Itching: yes Burning: yes Redness: yes Oozing: no Scaling: no Blisters: no Painful: no Fevers: no Change in detergents/soaps/personal care products: no Recent illness: no Recent travel:no History of same: yes Context: worse Alleviating factors: nothing Treatments attempted:hydrocortisone cream, benadryl , and lotion/moisturizer Shortness of breath: no  Throat/tongue swelling: no Myalgias/arthralgias: no  Relevant past medical, surgical, family and social history reviewed and updated as indicated. Interim medical history since our last visit reviewed. Allergies and medications reviewed and updated.  Review of Systems  Constitutional: Negative.   Respiratory: Negative.    Cardiovascular: Negative.   Musculoskeletal: Negative.   Skin:  Positive for rash. Negative for color change, pallor and wound.  Psychiatric/Behavioral: Negative.      Per HPI unless specifically indicated above     Objective:    BP 139/82 (BP Location: Left Arm, Patient Position: Sitting, Cuff Size: Large)   Pulse 67   Ht 5\' 2"  (1.575 m)   Wt 214 lb (97.1 kg)   BMI 39.14 kg/m   Wt Readings from Last 3 Encounters:  06/01/23 214 lb (97.1 kg)  04/10/23 215 lb (97.5 kg)  01/09/23 209 lb 12.8 oz (95.2 kg)    Physical Exam Vitals and nursing note reviewed.  Constitutional:      General: She is not in acute distress.    Appearance: Normal appearance. She is not ill-appearing, toxic-appearing or diaphoretic.  HENT:     Head: Normocephalic and atraumatic.     Right Ear: External ear normal.      Left Ear: External ear normal.     Nose: Nose normal.     Mouth/Throat:     Mouth: Mucous membranes are moist.     Pharynx: Oropharynx is clear.  Eyes:     General: No scleral icterus.       Right eye: No discharge.        Left eye: No discharge.     Extraocular Movements: Extraocular movements intact.     Conjunctiva/sclera: Conjunctivae normal.     Pupils: Pupils are equal, round, and reactive to light.  Cardiovascular:     Rate and Rhythm: Normal rate and regular rhythm.     Pulses: Normal pulses.     Heart sounds: Normal heart sounds. No murmur heard.    No friction rub. No gallop.  Pulmonary:     Effort: Pulmonary effort is normal. No respiratory distress.     Breath sounds: Normal breath sounds. No stridor. No wheezing, rhonchi or rales.  Chest:     Chest wall: No tenderness.  Musculoskeletal:        General: Normal range of motion.     Cervical back: Normal range of motion and neck supple.  Skin:    General: Skin is warm and dry.     Capillary Refill: Capillary refill takes less than 2 seconds.     Coloration: Skin is not jaundiced or pale.     Findings: Rash present. No  bruising, erythema or lesion.  Neurological:     General: No focal deficit present.     Mental Status: She is alert and oriented to person, place, and time. Mental status is at baseline.  Psychiatric:        Mood and Affect: Mood normal.        Behavior: Behavior normal.        Thought Content: Thought content normal.        Judgment: Judgment normal.     Results for orders placed or performed in visit on 01/09/23  Urinalysis, Routine w reflex microscopic   Collection Time: 01/09/23  3:09 PM  Result Value Ref Range   Specific Gravity, UA 1.020 1.005 - 1.030   pH, UA 7.0 5.0 - 7.5   Color, UA Yellow Yellow   Appearance Ur Clear Clear   Leukocytes,UA Negative Negative   Protein,UA Negative Negative/Trace   Glucose, UA Negative Negative   Ketones, UA Negative Negative   RBC, UA Negative  Negative   Bilirubin, UA Negative Negative   Urobilinogen, Ur 1.0 0.2 - 1.0 mg/dL   Nitrite, UA Negative Negative   Microscopic Examination Comment   CBC with Differential/Platelet   Collection Time: 01/09/23  3:10 PM  Result Value Ref Range   WBC 7.9 3.4 - 10.8 x10E3/uL   RBC 4.30 3.77 - 5.28 x10E6/uL   Hemoglobin 13.0 11.1 - 15.9 g/dL   Hematocrit 82.9 56.2 - 46.6 %   MCV 94 79 - 97 fL   MCH 30.2 26.6 - 33.0 pg   MCHC 32.3 31.5 - 35.7 g/dL   RDW 13.0 86.5 - 78.4 %   Platelets 290 150 - 450 x10E3/uL   Neutrophils 66 Not Estab. %   Lymphs 26 Not Estab. %   Monocytes 6 Not Estab. %   Eos 1 Not Estab. %   Basos 1 Not Estab. %   Neutrophils Absolute 5.2 1.4 - 7.0 x10E3/uL   Lymphocytes Absolute 2.1 0.7 - 3.1 x10E3/uL   Monocytes Absolute 0.4 0.1 - 0.9 x10E3/uL   EOS (ABSOLUTE) 0.1 0.0 - 0.4 x10E3/uL   Basophils Absolute 0.0 0.0 - 0.2 x10E3/uL   Immature Granulocytes 0 Not Estab. %   Immature Grans (Abs) 0.0 0.0 - 0.1 x10E3/uL  Comprehensive metabolic panel   Collection Time: 01/09/23  3:10 PM  Result Value Ref Range   Glucose 76 70 - 99 mg/dL   BUN 17 8 - 27 mg/dL   Creatinine, Ser 6.96 0.57 - 1.00 mg/dL   eGFR 97 >29 BM/WUX/3.24   BUN/Creatinine Ratio 25 12 - 28   Sodium 143 134 - 144 mmol/L   Potassium 4.5 3.5 - 5.2 mmol/L   Chloride 101 96 - 106 mmol/L   CO2 27 20 - 29 mmol/L   Calcium  9.6 8.7 - 10.3 mg/dL   Total Protein 6.9 6.0 - 8.5 g/dL   Albumin 4.3 3.9 - 4.9 g/dL   Globulin, Total 2.6 1.5 - 4.5 g/dL   Bilirubin Total <4.0 0.0 - 1.2 mg/dL   Alkaline Phosphatase 80 44 - 121 IU/L   AST 19 0 - 40 IU/L   ALT 16 0 - 32 IU/L  Lipid panel   Collection Time: 01/09/23  3:10 PM  Result Value Ref Range   Cholesterol, Total 173 100 - 199 mg/dL   Triglycerides 102 (H) 0 - 149 mg/dL   HDL 50 >72 mg/dL   VLDL Cholesterol Cal 41 (H) 5 - 40 mg/dL   LDL Chol Calc (NIH)  82 0 - 99 mg/dL   Chol/HDL Ratio 3.5 0.0 - 4.4 ratio  TSH   Collection Time: 01/09/23  3:10 PM  Result  Value Ref Range   TSH 2.430 0.450 - 4.500 uIU/mL  HgB A1c   Collection Time: 01/09/23  3:10 PM  Result Value Ref Range   Hgb A1c MFr Bld 5.8 (H) 4.8 - 5.6 %   Est. average glucose Bld gHb Est-mCnc 120 mg/dL      Assessment & Plan:   Problem List Items Addressed This Visit   None Visit Diagnoses       Allergic contact dermatitis due to plants, except food    -  Primary   Will treat with prednisone taper. Call if not getting better or getting worse.        Follow up plan: No follow-ups on file.

## 2023-06-01 NOTE — Telephone Encounter (Signed)
  Chief Complaint: poison ivy rash Symptoms: severe itchy rash to face, neck, stomach, arms, hands, legs Frequency: x 3 days Pertinent Negatives: Patient denies difficulty breathing, severe coughing Disposition: [] ED /[] Urgent Care (no appt availability in office) / [x] Appointment(In office/virtual)/ []  [facility]/ [] Home Care/ [] Refused Recommended Disposition /[] [facility]/ []  Follow-up with PCP Additional Notes: Patient states she has been trying OTC lotions and nothing helps. She states she has had a severe reaction to poison ivy in the past and is agreeable to be seen today. Patient states she needs to call her sister to confirm she can give her a ride, scheduled for acute visit with Jill Burch today.  Copied from CRM [id number]. Topic: Clinical - Red Word Triage >> Jun 01, 2023  8:18 AM Jill Burch T wrote: [facility] that prompted transfer to Nurse Triage: poison ivy near her right eye and all over her body Reason for Disposition  [1] Severe poison ivy, oak, or sumac reaction in the past AND [2] face or genitals involved  Answer Assessment - Initial Assessment Questions 1. APPEARANCE of RASH: "Describe the rash."      She states it looks like a cat scratched her, dots everywhere.  2. LOCATION: "Where is the rash located?"  (e.g., face, genitals, hands, legs)     "All over me and its up to my right eye now". Arms, legs, stomach, face, neck, hands,  near right eye.  3. SIZE: "How large is the rash?"      She states they vary in size, little ones and the largest one is about an inch.  4. ONSET: "When did the rash begin?"      3 days ago, she states she was pulling weeds and did not realize it was poison ivy.  5. ITCHING: "Does the rash itch?" If Yes, ask: "How bad is it?"   - MILD - doesn't interfere with normal activities   - MODERATE-SEVERE: interferes with work, school, sleep, or other activities      Yes, severe.  6. EXPOSURE:  "How were you exposed to the  plant (poison ivy, poison oak, sumac)"  "When were you exposed?"       Poison ivy, 3 days ago.  7. PAST HISTORY: "Have you had a poison ivy rash before?" If Yes, ask: "How bad was it?"     Yes, she states she had a severe reaction last time and had to get a shot.  8. PREGNANCY: "Is there any chance you are pregnant?" "When was your last menstrual period?"     N/A.  Protocols used: Poison Ivy - Oak - Sumac-A-AH

## 2023-06-13 ENCOUNTER — Other Ambulatory Visit: Payer: Self-pay | Admitting: Nurse Practitioner

## 2023-06-13 NOTE — Telephone Encounter (Signed)
 Copied from CRM [id number]. Topic: Clinical - Medication Refill >> Jun 13, 2023  4:39 PM Phil Braun wrote: Medication: rosuvastatin  (CRESTOR ) 40 MG tablet   Has the patient contacted their pharmacy? Yes   This is the patient's preferred pharmacy:   [facility] - [place], [place] - [address] [place] [address] Phone: [phone] Fax: [phone]   Is this the correct pharmacy for this prescription? Yes If no, delete pharmacy and type the correct one.   Has the prescription been filled recently? Yes  Is the patient out of the medication? Yes  Has the patient been seen for an appointment in the last year OR does the patient have an upcoming appointment? Yes  Can we respond through MyChart? Yes  Agent: Please be advised that Rx refills may take up to 3 business days. We ask that you follow-up with your pharmacy.

## 2023-06-14 ENCOUNTER — Ambulatory Visit: Payer: Self-pay

## 2023-06-14 VITALS — Ht 62.0 in | Wt 214.0 lb

## 2023-06-14 DIAGNOSIS — Z780 Asymptomatic menopausal state: Secondary | ICD-10-CM

## 2023-06-14 DIAGNOSIS — Z1211 Encounter for screening for malignant neoplasm of colon: Secondary | ICD-10-CM

## 2023-06-14 DIAGNOSIS — Z1231 Encounter for screening mammogram for malignant neoplasm of breast: Secondary | ICD-10-CM

## 2023-06-14 DIAGNOSIS — Z0000 Encounter for general adult medical examination without abnormal findings: Secondary | ICD-10-CM

## 2023-06-14 NOTE — Patient Instructions (Signed)
 Jill Burch , Thank you for taking time out of your busy schedule to complete your Annual Wellness Visit with me. I enjoyed our conversation and look forward to speaking with you again next year. I, as well as your care team,  appreciate your ongoing commitment to your health goals. Please review the following plan we discussed and let me know if I can assist you in the future. Your Game plan/ To Do List    Referrals: If you haven't heard from the office you've been referred to, please reach out to them at the phone provided.  [facility] for colonoscopy to screen for colon cancer. Ph# [phone] Follow up Visits: Next Medicare AWV with our clinical staff: 06/26/24 @ 1:50pm (phone visit)   Have you seen your provider in the last 6 months (3 months if uncontrolled diabetes)? Yes Next Office Visit with your provider: 07/10/23 @ 3:20pm  Clinician Recommendations:  Aim for 30 minutes of exercise or brisk walking, 6-8 glasses of water, and 5 servings of fruits and vegetables each day. Recommend getting a 2nd pneumonia vaccine. I have placed orders for a mammogram and bone density test. Call [facility] @ [phone] to schedule at your earliest convenience.      This is a list of the screening recommended for you and due dates:  Health Maintenance  Topic Date Due   Pneumonia Vaccine (2 of 2 - PCV) 06/21/2018   COVID-19 Vaccine (4 - 2024-25 season) 10/08/2022   Mammogram  10/25/2022   DEXA scan (bone density measurement)  Never done   Pap with HPV screening  08/26/2023   Flu Shot  09/07/2023   Medicare Annual Wellness Visit  06/13/2024   DTaP/Tdap/Td vaccine (4 - Td or Tdap) 06/21/2027   Colon Cancer Screening  10/14/2031   Hepatitis C Screening  Completed   HIV Screening  Completed   Zoster (Shingles) Vaccine  Completed   HPV Vaccine  Aged Out   Meningitis B Vaccine  Aged Out    Advanced directives: (Copy Requested) Please bring a copy of your health care power of  attorney and living will to the office to be added to your chart at your convenience. You can mail to [facility] [address] [place], [place] [address] or email to [email or web address] Advance Care Planning is important because it:  [x]  Makes sure you receive the medical care that is consistent with your values, goals, and preferences  [x]  It provides guidance to your family and loved ones and reduces their decisional burden about whether or not they are making the right decisions based on your wishes.  Follow the link provided in your after visit summary or read over the paperwork we have mailed to you to help you started getting your Advance Directives in place. If you need assistance in completing these, please reach out to us  so that we can help you!  See attachments for Preventive Care and Fall Prevention Tips.   Fall Prevention in the Home, Adult Falls can cause injuries and affect people of all ages. There are many simple things that you can do to make your home safe and to help prevent falls. If you need it, ask for help making these changes. What actions can I take to prevent falls? General information Use good lighting in all rooms. Make sure to: Replace any light bulbs that burn out. Turn on lights if it is dark and use night-lights. Keep items that you use often in  easy-to-reach places. Lower the shelves around your home if needed. Move furniture so that there are clear paths around it. Do not keep throw rugs or other things on the floor that can make you trip. If any of your floors are uneven, fix them. Add color or contrast paint or tape to clearly mark and help you see: Grab bars or handrails. First and last steps of staircases. Where the edge of each step is. If you use a ladder or stepladder: Make sure that it is fully opened. Do not climb a closed ladder. Make sure the sides of the ladder are locked in place. Have someone hold the ladder  while you use it. Know where your pets are as you move through your home. What can I do in the bathroom?     Keep the floor dry. Clean up any water that is on the floor right away. Remove soap buildup in the bathtub or shower. Buildup makes bathtubs and showers slippery. Use non-skid mats or decals on the floor of the bathtub or shower. Attach bath mats securely with double-sided, non-slip rug tape. If you need to sit down while you are in the shower, use a non-slip stool. Install grab bars by the toilet and in the bathtub and shower. Do not use towel bars as grab bars. What can I do in the bedroom? Make sure that you have a light by your bed that is easy to reach. Do not use any sheets or blankets on your bed that hang to the floor. Have a firm bench or chair with side arms that you can use for support when you get dressed. What can I do in the kitchen? Clean up any spills right away. If you need to reach something above you, use a sturdy step stool that has a grab bar. Keep electrical cables out of the way. Do not use floor polish or wax that makes floors slippery. What can I do with my stairs? Do not leave anything on the stairs. Make sure that you have a light switch at the top and the bottom of the stairs. Have them installed if you do not have them. Make sure that there are handrails on both sides of the stairs. Fix handrails that are broken or loose. Make sure that handrails are as long as the staircases. Install non-slip stair treads on all stairs in your home if they do not have carpet. Avoid having throw rugs at the top or bottom of stairs, or secure the rugs with carpet tape to prevent them from moving. Choose a carpet design that does not hide the edge of steps on the stairs. Make sure that carpet is firmly attached to the stairs. Fix any carpet that is loose or worn. What can I do on the outside of my home? Use bright outdoor lighting. Repair the edges of walkways and  driveways and fix any cracks. Clear paths of anything that can make you trip, such as tools or rocks. Add color or contrast paint or tape to clearly mark and help you see high doorway thresholds. Trim any bushes or trees on the main path into your home. Check that handrails are securely fastened and in good repair. Both sides of all steps should have handrails. Install guardrails along the edges of any raised decks or porches. Have leaves, snow, and ice cleared regularly. Use sand, salt, or ice melt on walkways during winter months if you live where there is ice and snow. In the garage,  clean up any spills right away, including grease or oil spills. What other actions can I take? Review your medicines with your health care provider. Some medicines can make you confused or feel dizzy. This can increase your chance of falling. Wear closed-toe shoes that fit well and support your feet. Wear shoes that have rubber soles and low heels. Use a cane, walker, scooter, or crutches that help you move around if needed. Talk with your provider about other ways that you can decrease your risk of falls. This may include seeing a physical therapist to learn to do exercises to improve movement and strength. Where to find more information Centers for Disease Control and Prevention, STEADI: TonerPromos.no [employer] on Aging: BaseRingTones.pl National Institute on Aging: BaseRingTones.pl Contact a health care provider if: You are afraid of falling at home. You feel weak, drowsy, or dizzy at home. You fall at home. Get help right away if you: Lose consciousness or have trouble moving after a fall. Have a fall that causes a head injury. These symptoms may be an emergency. Get help right away. Call 911. Do not wait to see if the symptoms will go away. Do not drive yourself to the hospital. This information is not intended to replace advice given to you by your health care provider. Make sure you discuss any questions you  have with your health care provider. Document Revised: 09/26/2021 Document Reviewed: 09/26/2021 Elsevier Patient Education  2024 [employer].

## 2023-06-14 NOTE — Progress Notes (Signed)
 Subjective:   Jill Burch is a 63 y.o. who presents for a Medicare Wellness preventive visit.  Visit Complete: Virtual I connected with  Jill Burch on 06/14/23 by a audio enabled telemedicine application and verified that I am speaking with the correct person using two identifiers.  Patient Location: Home  Provider Location: Home Office  I discussed the limitations of evaluation and management by telemedicine. The patient expressed understanding and agreed to proceed.  Vital Signs: Because this visit was a virtual/telehealth visit, some criteria may be missing or patient reported. Any vitals not documented were not able to be obtained and vitals that have been documented are patient reported.  VideoDeclined- This patient declined Librarian, academic. Therefore the visit was completed with audio only.  Persons Participating in Visit: Patient.  AWV Questionnaire: No: Patient Medicare AWV questionnaire was not completed prior to this visit.  Cardiac Risk Factors include: advanced age (>74men, >63 women);dyslipidemia;obesity (BMI >30kg/m2);Other (see comment), Risk factor comments: prediabetic     Objective:     Today's Vitals   06/14/23 1431  Weight: 214 lb (97.1 kg)  Height: 5\' 2"  (1.575 m)   Body mass index is 39.14 kg/m.     06/14/2023    2:50 PM 06/05/2022    3:32 PM 10/13/2021    7:58 AM 10/14/2020    3:36 PM 10/14/2020    6:17 AM 10/04/2020    2:24 PM 08/08/2015    1:48 PM  Advanced Directives  Does Patient Have a Medical Advance Directive? Yes No Yes Yes Yes Yes No  Type of Estate agent of [place];Living will  Healthcare Power of [place];Living will Healthcare Power of [place];Living will  Healthcare Power of [place];Living will   Does patient want to make changes to medical advance directive? No - Patient declined   No - Patient declined No - Patient declined    Copy of Healthcare Power of Attorney in Chart? No - copy  requested   No - copy requested     Would patient like information on creating a medical advance directive?  No - Patient declined   No - Patient declined No - Patient declined Yes - Educational materials given    Current Medications (verified) Outpatient Encounter Medications as of 06/14/2023  Medication Sig   acetaminophen  (TYLENOL ) 500 MG tablet Take 2 tablets (1,000 mg total) by mouth every 6 (six) hours as needed.   aspirin 81 MG EC tablet Take 81 mg by mouth daily. Swallow whole.   Beta Carotene (VITAMIN A) 25000 UNIT capsule Take 25,000 Units by mouth daily.   diclofenac  Sodium (VOLTAREN ) 1 % GEL Apply 2 g topically 4 (four) times daily as needed.   Homeopathic Products (ARNICARE) GEL Apply topically as needed.   Lidocaine  HCl (ASPERCREME LIDOCAINE ) 4 % LIQD Apply topically as needed.   Multiple Vitamins-Minerals (ADULT GUMMY PO) Take 2 capsules by mouth daily.   predniSONE  (DELTASONE ) 10 MG tablet 6 tabs all at once in the AM days 1 and 2, then 5 tabs in the AM days 3 and 4, decrease by 1 every other day until gone   rOPINIRole  (REQUIP ) 0.5 MG tablet TAKE 1 TABLET AT BEDTIME   rosuvastatin  (CRESTOR ) 40 MG tablet Take 1 tablet (40 mg total) by mouth daily.   venlafaxine  XR (EFFEXOR -XR) 150 MG 24 hr capsule TAKE 1 CAPSULE WITH BREAKAST   venlafaxine  XR (EFFEXOR -XR) 75 MG 24 hr capsule TAKE 1 CAPSULE EVERY DAY WITH BREAKFAST (TAKE WITH  THE 150MG )   Vitamin E 450 MG (1000 UT) CAPS Take by mouth.   No facility-administered encounter medications on file as of 06/14/2023.    Allergies (verified) Lactose, Lactose intolerance (gi), Poison ivy extract, Poison oak extract, and Sumac   History: Past Medical History:  Diagnosis Date   Anxiety    Depression    Hyperlipidemia    Osteoarthritis    PONV (postoperative nausea and vomiting)    Pre-diabetes    Past Surgical History:  Procedure Laterality Date   APPLICATION OF WOUND VAC  10/14/2020   Procedure: APPLICATION OF WOUND VAC;   Surgeon: Elner Hahn, MD;  Location: [facility] ORS;  Service: Orthopedics;;   COLONOSCOPY WITH PROPOFOL  N/A 10/13/2021   Procedure: COLONOSCOPY WITH PROPOFOL ;  Surgeon: Luke Salaam, MD;  Location: [facility] ENDOSCOPY;  Service: Gastroenterology;  Laterality: N/A;   TOTAL KNEE ARTHROPLASTY Left 10/14/2020   Procedure: TOTAL KNEE ARTHROPLASTY;  Surgeon: Elner Hahn, MD;  Location: [facility] ORS;  Service: Orthopedics;  Laterality: Left;   TUBAL LIGATION  1995   Family History  Problem Relation Age of Onset   Depression Mother    Arthritis Mother    Heart disease Mother    Heart attack Mother    Cancer Father        bladder   Diabetes Father    Depression Father    Hypertension Father    Diabetes Brother    COPD Neg Hx    Stroke Neg Hx    Social History   Socioeconomic History   Marital status: Married    Spouse name: [name]   Number of children: 1   Years of education: Not on file   Highest education level: Not on file  Occupational History   Not on file  Tobacco Use   Smoking status: Former    Current packs/day: 0.00    Average packs/day: 0.1 packs/day for 16.0 years (1.6 ttl pk-yrs)    Types: Cigarettes    Start date: 61    Quit date: 02/06/1990    Years since quitting: 33.3    Passive exposure: Past   Smokeless tobacco: Never  Vaping Use   Vaping status: Never Used  Substance and Sexual Activity   Alcohol  use: No    Alcohol /week: 0.0 standard drinks of alcohol    Drug use: No   Sexual activity: Not Currently  Other Topics Concern   Not on file  Social History Narrative   Live at home with husband.   Social Drivers of Corporate investment banker Strain: Low Risk  (06/14/2023)   Overall Financial Resource Strain (CARDIA)    Difficulty of Paying Living Expenses: Not hard at all  Food Insecurity: No Food Insecurity (06/14/2023)   Hunger Vital Sign    Worried About Running Out of Food in the Last Year: Never true    Ran Out of Food in the Last Year: Never true  Transportation  Needs: No Transportation Needs (06/14/2023)   PRAPARE - Administrator, Civil Service (Medical): No    Lack of Transportation (Non-Medical): No  Physical Activity: Inactive (06/14/2023)   Exercise Vital Sign    Days of Exercise per Week: 0 days    Minutes of Exercise per Session: 0 min  Stress: No Stress Concern Present (06/14/2023)   [employer] of Occupational Health - Occupational Stress Questionnaire    Feeling of Stress : Only a little  Social Connections: Moderately Integrated (06/14/2023)   Social Connection and Isolation Panel [  NHANES]    Frequency of Communication with Friends and Family: More than three times a week    Frequency of Social Gatherings with Friends and Family: Three times a week    Attends Religious Services: More than 4 times per year    Active Member of Clubs or Organizations: No    Attends Banker Meetings: Never    Marital Status: Married    Tobacco Counseling Counseling given: No    Clinical Intake:  Pre-visit preparation completed: Yes  Pain : No/denies pain     BMI - recorded: 39.14 Nutritional Status: BMI > 30  Obese Nutritional Risks: None Diabetes: No  Lab Results  Component Value Date   HGBA1C 5.8 (H) 01/09/2023   HGBA1C 6.0 (H) 07/10/2022   HGBA1C 5.7 (H) 01/03/2022     How often do you need to have someone help you when you read instructions, pamphlets, or other written materials from your doctor or pharmacy?: 3 - Sometimes (with complicated materials) What is the last grade level you completed in school?: 12  Interpreter Needed?: No  Information entered by :: Jaunita Messier, CMA   Activities of Daily Living     06/14/2023    2:32 PM  In your present state of health, do you have any difficulty performing the following activities:  Hearing? 0  Vision? 0  Difficulty concentrating or making decisions? 1  Comment has trouble with complicated paper work  Walking or climbing stairs? 0  Dressing or  bathing? 0  Doing errands, shopping? 1  Comment doesn't drive, husband takes to appointments, or takes Retail buyer and eating ? N  Using the Toilet? N  In the past six months, have you accidently leaked urine? Y  Comment wears Poise pads  Do you have problems with loss of bowel control? N  Managing your Medications? N  Managing your Finances? N  Housekeeping or managing your Housekeeping? N    Patient Care Team: Aileen Alexanders, NP as PCP - General Dew, Donald Frost, MD as Referring Physician (Vascular Surgery) Luke Salaam, MD as Consulting Physician (Gastroenterology)  Indicate any recent Medical Services you may have received from other than [facility] providers in the past year (date may be approximate).     Assessment:    This is a routine wellness examination for Latressa.  Hearing/Vision screen Hearing Screening - Comments:: Denies hearing loss Vision Screening - Comments:: Needs eye exam, included a list of eye drs in AVS   Goals Addressed               This Visit's Progress     Weight (lb) < 170 lb (77.1 kg) (pt-stated)   214 lb (97.1 kg)     Lose 45 lbs       Depression Screen     06/14/2023    2:45 PM 06/01/2023    9:56 AM 01/09/2023    3:15 PM 07/10/2022    3:08 PM 06/05/2022    3:30 PM 02/07/2022    3:09 PM 01/03/2022    2:49 PM  PHQ 2/9 Scores  PHQ - 2 Score 1 2 4 2 2  0 4  PHQ- 9 Score 5 9 12 12 4 7 11     Fall Risk     06/14/2023    2:52 PM 07/10/2022    3:08 PM 06/05/2022    3:33 PM 02/07/2022    3:09 PM 01/03/2022    2:48 PM  Fall Risk   Falls in  the past year? 0 1 0 0 0  Number falls in past yr: 0 1 0 0 0  Injury with Fall? 0 0 0 0 0  Risk for fall due to : No Fall Risks History of fall(s) No Fall Risks No Fall Risks No Fall Risks  Follow up Falls prevention discussed;Falls evaluation completed Falls evaluation completed Falls prevention discussed;Falls evaluation completed Falls evaluation completed Falls evaluation completed     MEDICARE RISK AT HOME:  Medicare Risk at Home Any stairs in or around the home?: Yes If so, are there any without handrails?: No Home free of loose throw rugs in walkways, pet beds, electrical cords, etc?: Yes Adequate lighting in your home to reduce risk of falls?: Yes Life alert?: No Use of a cane, walker or w/c?: No Grab bars in the bathroom?: Yes Shower chair or bench in shower?: Yes Elevated toilet seat or a handicapped toilet?: No  TIMED UP AND GO:  Was the test performed?  No  Cognitive Function: 6CIT completed        06/14/2023    2:53 PM 06/05/2022    3:39 PM  6CIT Screen  What Year? 0 points 0 points  What month? 0 points 0 points  What time? 0 points 0 points  Count back from 20 0 points 2 points  Months in reverse 4 points 0 points  Repeat phrase 2 points 8 points  Total Score 6 points 10 points    Immunizations Immunization History  Administered Date(s) Administered   Influenza,inj,Quad PF,6+ Mos 11/08/2016, 11/18/2018, 10/15/2020   Influenza-Unspecified 09/24/2013, 08/27/2015, 10/08/2015, 09/15/2017, 11/08/2020, 11/09/2021, 10/19/2022, 11/20/2022   Janssen (J&J) SARS-COV-2 Vaccination 04/21/2019   Moderna Covid-19 Fall Seasonal Vaccine 42yrs & older 12/05/2021   Pneumococcal Polysaccharide-23 06/20/2017   Rsv, Bivalent, Protein Subunit Rsvpref,pf Pattricia Bores) 11/28/2021   Td 07/18/2006, 02/26/2017   Tdap 06/20/2017   Unspecified SARS-COV-2 Vaccination 05/01/2019, 11/28/2021   Zoster Recombinant(Shingrix) 11/08/2016, 06/20/2017, 01/22/2023, 04/18/2023    Screening Tests Health Maintenance  Topic Date Due   Pneumonia Vaccine 46+ Years old (2 of 2 - PCV) 06/21/2018   COVID-19 Vaccine (4 - 2024-25 season) 10/08/2022   MAMMOGRAM  10/25/2022   DEXA SCAN  Never done   Cervical Cancer Screening (HPV/Pap Cotest)  08/26/2023   INFLUENZA VACCINE  09/07/2023   Medicare Annual Wellness (AWV)  06/13/2024   DTaP/Tdap/Td (4 - Td or Tdap) 06/21/2027    Colonoscopy  10/14/2031   Hepatitis C Screening  Completed   HIV Screening  Completed   Zoster Vaccines- Shingrix  Completed   HPV VACCINES  Aged Out   Meningococcal B Vaccine  Aged Out    Health Maintenance  Health Maintenance Due  Topic Date Due   Pneumonia Vaccine 67+ Years old (2 of 2 - PCV) 06/21/2018   COVID-19 Vaccine (4 - 2024-25 season) 10/08/2022   MAMMOGRAM  10/25/2022   DEXA SCAN  Never done   Health Maintenance Items Addressed: Mammogram ordered, DEXA ordered, Referral sent to GI for colonoscopy, See Nurse Notes  Additional Screening:  Vision Screening: Recommended annual ophthalmology exams for early detection of glaucoma and other disorders of the eye.  Dental Screening: Recommended annual dental exams for proper oral hygiene  Community Resource Referral / Chronic Care Management: CRR required this visit?  No   CCM required this visit?  No   Plan:    I have personally reviewed and noted the following in the patient's chart:   Medical and social history Use of alcohol , tobacco  or illicit drugs  Current medications and supplements including opioid prescriptions. Patient is not currently taking opioid prescriptions. Functional ability and status Nutritional status Physical activity Advanced directives List of other physicians Hospitalizations, surgeries, and ER visits in previous 12 months Vitals Screenings to include cognitive, depression, and falls Referrals and appointments  In addition, I have reviewed and discussed with patient certain preventive protocols, quality metrics, and best practice recommendations. A written personalized care plan for preventive services as well as general preventive health recommendations were provided to patient.   Jaunita Messier, CMA   06/14/2023   After Visit Summary: (Mail) Due to this being a telephonic visit, the after visit summary with patients personalized plan was offered to patient via mail   Notes: Please  refer to Routing Comments.

## 2023-06-15 MED ORDER — ROSUVASTATIN CALCIUM 40 MG PO TABS: 40.0000 mg | ORAL_TABLET | Freq: Every day | ORAL | 1 refills | Status: DC

## 2023-06-15 NOTE — Telephone Encounter (Signed)
 Requested Prescriptions  Pending Prescriptions Disp Refills   rosuvastatin  (CRESTOR ) 40 MG tablet 90 tablet 1    Sig: Take 1 tablet (40 mg total) by mouth daily.     Cardiovascular:  Antilipid - Statins 2 Failed - 06/15/2023 12:46 PM      Failed - Lipid Panel in normal range within the last 12 months    Cholesterol, Total  Date Value Ref Range Status  01/09/2023 173 100 - 199 mg/dL Final   LDL Chol Calc (NIH)  Date Value Ref Range Status  01/09/2023 82 0 - 99 mg/dL Final   HDL  Date Value Ref Range Status  01/09/2023 50 >39 mg/dL Final   Triglycerides  Date Value Ref Range Status  01/09/2023 246 (H) 0 - 149 mg/dL Final         Passed - Cr in normal range and within 360 days    Creatinine, Ser  Date Value Ref Range Status  01/09/2023 0.68 0.57 - 1.00 mg/dL Final         Passed - Patient is not pregnant      Passed - Valid encounter within last 12 months    Recent Outpatient Visits           2 weeks ago Allergic contact dermatitis due to plants, except food   [place] [facility] [place], Jerilee Montane, DO       Future Appointments             In 3 weeks Aileen Alexanders, NP  [facility]

## 2023-07-10 ENCOUNTER — Ambulatory Visit: Payer: Self-pay | Admitting: Nurse Practitioner

## 2023-07-10 ENCOUNTER — Encounter: Payer: Self-pay | Admitting: Nurse Practitioner

## 2023-07-10 DIAGNOSIS — F32A Depression, unspecified: Secondary | ICD-10-CM | POA: Diagnosis not present

## 2023-07-10 DIAGNOSIS — E785 Hyperlipidemia, unspecified: Secondary | ICD-10-CM | POA: Diagnosis not present

## 2023-07-10 DIAGNOSIS — R7303 Prediabetes: Secondary | ICD-10-CM

## 2023-07-10 DIAGNOSIS — F419 Anxiety disorder, unspecified: Secondary | ICD-10-CM | POA: Diagnosis not present

## 2023-07-10 MED ORDER — TRAZODONE HCL 50 MG PO TABS: 25.0000 mg | ORAL_TABLET | Freq: Every evening | ORAL | 1 refills | Status: DC | PRN

## 2023-07-10 NOTE — Progress Notes (Unsigned)
 BP 136/73   Pulse 65   Ht 5\' 2"  (1.575 m)   Wt 215 lb (97.5 kg)   BMI 39.32 kg/m    Subjective:    Patient ID: Jill Burch, female    DOB: 1960-06-21, 63 y.o.   MRN: [id number]  HPI: Jill Burch is a 63 y.o. female  Chief Complaint  Patient presents with   Medical Management of Chronic Issues   Anxiety    Worried about what is going on in the news   DEPRESSION/ANXIETY Patient states her depression is not going great.  She is struggling with what is going on in the world right now.  Having trouble with anxiety is social situations and having diarrhea when she needs to leave the house.  She issue Effexor  225mg  daily.  Does not want to add additional medication.  Denies SI.  Flowsheet Row Office Visit from 07/10/2022 in [facility]  PHQ-9 Total Score 12         07/10/2022    3:09 PM 02/07/2022    3:09 PM 01/03/2022    2:49 PM 08/31/2021    3:14 PM  GAD 7 : Generalized Anxiety Score  Nervous, Anxious, on Edge 1 0 2 2  Control/stop worrying 1 0 0 1  Worry too much - different things 0 1 0 1  Trouble relaxing 0 0 0 0  Restless 0 0 0 0  Easily annoyed or irritable 2 1 2 1   Afraid - awful might happen 0 1 1 2   Total GAD 7 Score 4 3 5 7   Anxiety Difficulty  Not difficult at all Somewhat difficult Not difficult at all     HYPERLIPIDEMIA Hyperlipidemia status: excellent compliance Satisfied with current treatment?  no Side effects:  no Medication compliance: excellent compliance Past cholesterol meds: rosuvastatin  (crestor ) and lovaza Supplements: none Aspirin:  no The 10-year ASCVD risk score (Arnett DK, et al., 2019) is: 5%   Values used to calculate the score:     Age: 11 years     Sex: Female     Is Non-Hispanic African American: No     Diabetic: No     Tobacco smoker: No     Systolic Blood Pressure: 131 mmHg     Is BP treated: No     HDL Cholesterol: 52 mg/dL     Total Cholesterol: 176 mg/dL Chest pain:  no Coronary artery disease:   no Family history CAD:  no Family history early CAD:  no  Denies HA, CP, SOB, dizziness, palpitations, visual changes, and lower extremity swelling.     Relevant past medical, surgical, family and social history reviewed and updated as indicated. Interim medical history since our last visit reviewed. Allergies and medications reviewed and updated.  Review of Systems  Eyes:  Negative for visual disturbance.  Respiratory:  Negative for cough, chest tightness and shortness of breath.   Cardiovascular:  Negative for chest pain, palpitations and leg swelling.  Neurological:  Negative for dizziness and headaches.  Psychiatric/Behavioral:  Positive for dysphoric mood. Negative for sleep disturbance. The patient is nervous/anxious.     Per HPI unless specifically indicated above     Objective:     BP 136/73   Pulse 65   Ht 5\' 2"  (1.575 m)   Wt 215 lb (97.5 kg)   BMI 39.32 kg/m   Wt Readings from Last 3 Encounters:  07/10/23 215 lb (97.5 kg)  06/14/23 214 lb (97.1 kg)  06/01/23  214 lb (97.1 kg)    Physical Exam Vitals and nursing note reviewed.  Constitutional:      General: She is not in acute distress.    Appearance: Normal appearance. She is obese. She is not ill-appearing, toxic-appearing or diaphoretic.  HENT:     Head: Normocephalic.     Right Ear: External ear normal.     Left Ear: External ear normal.     Nose: Nose normal.     Mouth/Throat:     Mouth: Mucous membranes are moist.     Pharynx: Oropharynx is clear.  Eyes:     General:        Right eye: No discharge.        Left eye: No discharge.     Extraocular Movements: Extraocular movements intact.     Conjunctiva/sclera: Conjunctivae normal.     Pupils: Pupils are equal, round, and reactive to light.  Cardiovascular:     Rate and Rhythm: Normal rate and regular rhythm.     Heart sounds: No murmur heard. Pulmonary:     Effort: Pulmonary effort is normal. No respiratory distress.     Breath sounds: Normal  breath sounds. No wheezing or rales.  Musculoskeletal:     Cervical back: Normal range of motion and neck supple.  Skin:    General: Skin is warm and dry.     Capillary Refill: Capillary refill takes less than 2 seconds.  Neurological:     General: No focal deficit present.     Mental Status: She is alert and oriented to person, place, and time. Mental status is at baseline.  Psychiatric:        Mood and Affect: Mood normal.        Behavior: Behavior normal.        Thought Content: Thought content normal.        Judgment: Judgment normal.     Results for orders placed or performed in visit on 07/10/23  Comp Met (CMET)   Collection Time: 07/10/23  3:36 PM  Result Value Ref Range   Glucose 80 70 - 99 mg/dL   BUN 19 8 - 27 mg/dL   Creatinine, Ser 1.61 0.57 - 1.00 mg/dL   eGFR 81 >09 UE/AVW/0.98   BUN/Creatinine Ratio 23 12 - 28   Sodium 143 134 - 144 mmol/L   Potassium 4.5 3.5 - 5.2 mmol/L   Chloride 103 96 - 106 mmol/L   CO2 24 20 - 29 mmol/L   Calcium  9.5 8.7 - 10.3 mg/dL   Total Protein 6.7 6.0 - 8.5 g/dL   Albumin 4.5 3.9 - 4.9 g/dL   Globulin, Total 2.2 1.5 - 4.5 g/dL   Bilirubin Total <1.1 0.0 - 1.2 mg/dL   Alkaline Phosphatase 71 44 - 121 IU/L   AST 20 0 - 40 IU/L   ALT 19 0 - 32 IU/L  HgB A1c   Collection Time: 07/10/23  3:36 PM  Result Value Ref Range   Hgb A1c MFr Bld 5.9 (H) 4.8 - 5.6 %   Est. average glucose Bld gHb Est-mCnc 123 mg/dL  Lipid Profile   Collection Time: 07/10/23  3:36 PM  Result Value Ref Range   Cholesterol, Total 169 100 - 199 mg/dL   Triglycerides 914 (H) 0 - 149 mg/dL   HDL 52 >78 mg/dL   VLDL Cholesterol Cal 29 5 - 40 mg/dL   LDL Chol Calc (NIH) 88 0 - 99 mg/dL   Chol/HDL Ratio 3.3 0.0 -  4.4 ratio      Assessment & Plan:   Problem List Items Addressed This Visit       Other   Anxiety   Chronic. Not well controlled.  Does not want to add another medication.  Continue with Effexor  225mg .  Return to clinic if symptoms worsen to  discuss other medication options.  Follow up in 6 months.  Call sooner if concerns arise.      Relevant Medications   traZODone (DESYREL) 50 MG tablet   venlafaxine  XR (EFFEXOR -XR) 150 MG 24 hr capsule   venlafaxine  XR (EFFEXOR -XR) 75 MG 24 hr capsule   Other Relevant Orders   Comp Met (CMET) (Completed)   Depression   Chronic. Not well controlled.  Does not want to add another medication.  Continue with Effexor  225mg .  Return to clinic if symptoms worsen to discuss other medication options.  Follow up in 6 months.  Call sooner if concerns arise.      Relevant Medications   traZODone (DESYREL) 50 MG tablet   venlafaxine  XR (EFFEXOR -XR) 150 MG 24 hr capsule   venlafaxine  XR (EFFEXOR -XR) 75 MG 24 hr capsule   Other Relevant Orders   Comp Met (CMET) (Completed)   Hyperlipidemia   Chronic.  Controlled.  Continue with current medication regimen of rosuvastatin  40mg  daily.  Refills sent today.  Labs ordered today.  Return to clinic in 6 months for reevaluation.  Call sooner if concerns arise.        Relevant Orders   Lipid Profile (Completed)   Pre-diabetes   Chronic.  Controlled.  Continue with current medication regimen.  Last A1c in December was 5.8%.  Labs ordered today.  Return to clinic in 6 months for reevaluation.  Call sooner if concerns arise.        Relevant Orders   HgB A1c (Completed)   Obesity, morbid (HCC) - Primary   Recommended eating smaller high protein, low fat meals more frequently and exercising 30 mins a day 5 times a week with a goal of 10-15lb weight loss in the next 3 months.       Relevant Orders   Comp Met (CMET) (Completed)     Follow up plan: Return in about 6 months (around 01/09/2024) for Physical and Fasting labs.

## 2023-07-11 ENCOUNTER — Ambulatory Visit: Payer: Self-pay | Admitting: Nurse Practitioner

## 2023-07-11 LAB — COMPREHENSIVE METABOLIC PANEL WITH GFR
ALT: 19 IU/L (ref 0–32)
AST: 20 IU/L (ref 0–40)
Albumin: 4.5 g/dL (ref 3.9–4.9)
Alkaline Phosphatase: 71 IU/L (ref 44–121)
BUN/Creatinine Ratio: 23 (ref 12–28)
BUN: 19 mg/dL (ref 8–27)
Bilirubin Total: <0.2 mg/dL (ref 0.0–1.2)
CO2: 24 mmol/L (ref 20–29)
Calcium: 9.5 mg/dL (ref 8.7–10.3)
Chloride: 103 mmol/L (ref 96–106)
Creatinine, Ser: 0.81 mg/dL (ref 0.57–1.00)
Globulin, Total: 2.2 g/dL (ref 1.5–4.5)
Glucose: 80 mg/dL (ref 70–99)
Potassium: 4.5 mmol/L (ref 3.5–5.2)
Sodium: 143 mmol/L (ref 134–144)
Total Protein: 6.7 g/dL (ref 6.0–8.5)
eGFR: 81 mL/min/{1.73_m2} (ref >59)

## 2023-07-11 LAB — LIPID PANEL
Chol/HDL Ratio: 3.3 ratio (ref 0.0–4.4)
Cholesterol, Total: 169 mg/dL (ref 100–199)
HDL: 52 mg/dL (ref >39)
LDL Chol Calc (NIH): 88 mg/dL (ref 0–99)
Triglycerides: 169 mg/dL — ABNORMAL HIGH (ref 0–149)
VLDL Cholesterol Cal: 29 mg/dL (ref 5–40)

## 2023-07-11 LAB — HEMOGLOBIN A1C
Est. average glucose Bld gHb Est-mCnc: 123 mg/dL
Hgb A1c MFr Bld: 5.9 % — ABNORMAL HIGH (ref 4.8–5.6)

## 2023-07-11 MED ORDER — VENLAFAXINE HCL ER 150 MG PO CP24: ORAL_CAPSULE | ORAL | 1 refills | Status: DC

## 2023-07-11 MED ORDER — ROPINIROLE HCL 0.5 MG PO TABS: 0.5000 mg | ORAL_TABLET | Freq: Every day | ORAL | 1 refills | Status: DC

## 2023-07-11 MED ORDER — VENLAFAXINE HCL ER 75 MG PO CP24: 75.0000 mg | ORAL_CAPSULE | Freq: Every day | ORAL | 1 refills | Status: DC

## 2023-07-11 NOTE — Assessment & Plan Note (Signed)
 Chronic.  Controlled.  Continue with current medication regimen of rosuvastatin  40mg  daily.  Refills sent today.  Labs ordered today.  Return to clinic in 6 months for reevaluation.  Call sooner if concerns arise.

## 2023-07-11 NOTE — Assessment & Plan Note (Signed)
 Recommended eating smaller high protein, low fat meals more frequently and exercising 30 mins a day 5 times a week with a goal of 10-15lb weight loss in the next 3 months.

## 2023-07-11 NOTE — Assessment & Plan Note (Signed)
 Chronic.  Controlled.  Continue with current medication regimen.  Last A1c in December was 5.8%.  Labs ordered today.  Return to clinic in 6 months for reevaluation.  Call sooner if concerns arise.

## 2023-07-11 NOTE — Assessment & Plan Note (Signed)
 Chronic. Not well controlled.  Does not want to add another medication.  Continue with Effexor  225mg .  Return to clinic if symptoms worsen to discuss other medication options.  Follow up in 6 months.  Call sooner if concerns arise.

## 2023-07-18 ENCOUNTER — Other Ambulatory Visit: Payer: Self-pay | Admitting: Nurse Practitioner

## 2023-07-19 ENCOUNTER — Other Ambulatory Visit: Payer: Self-pay

## 2023-07-19 ENCOUNTER — Telehealth: Payer: Self-pay

## 2023-07-19 DIAGNOSIS — Z1211 Encounter for screening for malignant neoplasm of colon: Secondary | ICD-10-CM

## 2023-07-19 MED ORDER — PEG 3350-KCL-NA BICARB-NACL 420 G PO SOLR: 4000.0000 mL | Freq: Once | ORAL | 0 refills | Status: AC

## 2023-07-19 NOTE — Telephone Encounter (Signed)
 Requested Prescriptions  Refused Prescriptions Disp Refills   venlafaxine  XR (EFFEXOR -XR) 75 MG 24 hr capsule [Pharmacy Med Name: Venlafaxine  HCl ER Oral Capsule Extended Release 24 Hour 75 MG] 90 capsule 3    Sig: TAKE 1 CAPSULE EVERY DAY WITH BREAKFAST (TAKE WITH THE 150MG )     Psychiatry: Antidepressants - SNRI - desvenlafaxine & venlafaxine  Failed - 07/19/2023  1:14 PM      Failed - Lipid Panel in normal range within the last 12 months    Cholesterol, Total  Date Value Ref Range Status  07/10/2023 169 100 - 199 mg/dL Final   LDL Chol Calc (NIH)  Date Value Ref Range Status  07/10/2023 88 0 - 99 mg/dL Final   HDL  Date Value Ref Range Status  07/10/2023 52 >39 mg/dL Final   Triglycerides  Date Value Ref Range Status  07/10/2023 169 (H) 0 - 149 mg/dL Final         Passed - Cr in normal range and within 360 days    Creatinine, Ser  Date Value Ref Range Status  07/10/2023 0.81 0.57 - 1.00 mg/dL Final         Passed - Completed PHQ-2 or PHQ-9 in the last 360 days      Passed - Last BP in normal range    BP Readings from Last 1 Encounters:  07/10/23 136/73         Passed - Valid encounter within last 6 months    Recent Outpatient Visits           1 week ago Obesity, morbid (HCC)   [place] [facility] Aileen Alexanders, NP   1 month ago Allergic contact dermatitis due to plants, except food   [place] [facility] [place], Megan P, DO               venlafaxine  XR (EFFEXOR -XR) 150 MG 24 hr capsule [Pharmacy Med Name: Venlafaxine  HCl ER Oral Capsule Extended Release 24 Hour 150 MG] 90 capsule 3    Sig: TAKE 1 CAPSULE WITH BREAKAST     Psychiatry: Antidepressants - SNRI - desvenlafaxine & venlafaxine  Failed - 07/19/2023  1:14 PM      Failed - Lipid Panel in normal range within the last 12 months    Cholesterol, Total  Date Value Ref Range Status  07/10/2023 169 100 - 199 mg/dL Final   LDL Chol Calc (NIH)  Date Value Ref Range  Status  07/10/2023 88 0 - 99 mg/dL Final   HDL  Date Value Ref Range Status  07/10/2023 52 >39 mg/dL Final   Triglycerides  Date Value Ref Range Status  07/10/2023 169 (H) 0 - 149 mg/dL Final         Passed - Cr in normal range and within 360 days    Creatinine, Ser  Date Value Ref Range Status  07/10/2023 0.81 0.57 - 1.00 mg/dL Final         Passed - Completed PHQ-2 or PHQ-9 in the last 360 days      Passed - Last BP in normal range    BP Readings from Last 1 Encounters:  07/10/23 136/73         Passed - Valid encounter within last 6 months    Recent Outpatient Visits           1 week ago Obesity, morbid [facility])   [place] [facility] Aileen Alexanders, NP   1 month ago Allergic contact dermatitis  due to plants, except food   [place] [facility] [place], [place], [place]

## 2023-07-19 NOTE — Telephone Encounter (Signed)
 Request Date: 08/29/23 (Repeat due to poor prep.  Nulytely prep used for this one) Requesting Physician: Dr. Cornel Diesel   PATIENT REVIEW QUESTIONS: The patient responded to the following health history questions as indicated:     1. Are you having any GI issues? No 2. Do you have a personal history of Polyps? No 3. Do you have a family history of Colon Cancer or Polyps? yes (twin sister polyps) 4. Diabetes Mellitus? Tno 5. Joint replacements in the past 12 months?September 2022 Left knee surgery 6. Major health problems in the past 3 months?no 7. Any artificial heart valves, MVP, or defibrillator?no    MEDICATIONS & ALLERGIES:    Patient reports the following regarding taking any anticoagulation/antiplatelet therapy:   Plavix, Coumadin, Eliquis , Xarelto, Lovenox, Pradaxa, Brilinta, or Effient? no Aspirin? yes (81 mg daily)  Patient confirms/reports the following medications:  Current Outpatient Medications  Medication Sig Dispense Refill   acetaminophen  (TYLENOL ) 500 MG tablet Take 2 tablets (1,000 mg total) by mouth every 6 (six) hours as needed. 30 tablet 0   aspirin 81 MG EC tablet Take 81 mg by mouth daily. Swallow whole.     Beta Carotene (VITAMIN A) 25000 UNIT capsule Take 25,000 Units by mouth daily.     diclofenac  Sodium (VOLTAREN ) 1 % GEL Apply 2 g topically 4 (four) times daily as needed.     Homeopathic Products (ARNICARE) GEL Apply topically as needed.     Lidocaine  HCl (ASPERCREME LIDOCAINE ) 4 % LIQD Apply topically as needed.     Multiple Vitamins-Minerals (ADULT GUMMY PO) Take 2 capsules by mouth daily.     rOPINIRole  (REQUIP ) 0.5 MG tablet Take 1 tablet (0.5 mg total) by mouth at bedtime. 90 tablet 1   rosuvastatin  (CRESTOR ) 40 MG tablet Take 1 tablet (40 mg total) by mouth daily. 90 tablet 1   traZODone  (DESYREL ) 50 MG tablet Take 0.5-1 tablets (25-50 mg total) by mouth at bedtime as needed for sleep. 90 tablet 1   venlafaxine  XR (EFFEXOR -XR) 150 MG 24 hr capsule Take 1  capsule with breakast 90 capsule 1   venlafaxine  XR (EFFEXOR -XR) 75 MG 24 hr capsule Take 1 capsule (75 mg total) by mouth daily with breakfast. 90 capsule 1   Vitamin E 450 MG (1000 UT) CAPS Take by mouth.     No current facility-administered medications for this visit.    Patient confirms/reports the following allergies:  Allergies  Allergen Reactions   Lactose Diarrhea    Bloating, gas    Lactose Intolerance (Gi) Diarrhea    Bloating, gas    Poison Ivy Extract Hives and Swelling   Poison Oak Extract Hives and Swelling   Sumac Hives and Swelling    No orders of the defined types were placed in this encounter.   AUTHORIZATION INFORMATION Primary Insurance: 1D#: Group #:  Secondary Insurance: 1D#: Group #:  SCHEDULE INFORMATION: Date: 08/29/23 Time: Location: [facility]

## 2023-08-20 ENCOUNTER — Telehealth: Payer: Self-pay

## 2023-08-20 NOTE — Telephone Encounter (Signed)
 Pt would like to cancel procedure due to her being required to pay over $400... She states she can not afford at this time

## 2023-08-29 ENCOUNTER — Encounter: Admission: RE | Payer: Self-pay | Source: Home / Self Care

## 2023-08-29 ENCOUNTER — Ambulatory Visit: Admission: RE | Admit: 2023-08-29 | Source: Home / Self Care | Admitting: General Surgery

## 2023-08-29 SURGERY — COLONOSCOPY
Anesthesia: General

## 2023-09-20 ENCOUNTER — Other Ambulatory Visit: Payer: Self-pay | Admitting: Nurse Practitioner

## 2023-09-20 DIAGNOSIS — N6489 Other specified disorders of breast: Secondary | ICD-10-CM

## 2023-10-04 ENCOUNTER — Encounter: Payer: Self-pay | Admitting: Emergency Medicine

## 2023-10-04 ENCOUNTER — Other Ambulatory Visit: Payer: Self-pay

## 2023-10-04 ENCOUNTER — Emergency Department

## 2023-10-04 ENCOUNTER — Emergency Department
Admission: EM | Admit: 2023-10-04 | Discharge: 2023-10-04 | Disposition: A | Discharge status: Home / Self Care | Attending: Emergency Medicine | Admitting: Emergency Medicine

## 2023-10-04 ENCOUNTER — Ambulatory Visit: Payer: Self-pay | Admitting: *Deleted

## 2023-10-04 ENCOUNTER — Ambulatory Visit: Payer: Self-pay

## 2023-10-04 DIAGNOSIS — M25562 Pain in left knee: Secondary | ICD-10-CM | POA: Diagnosis not present

## 2023-10-04 DIAGNOSIS — M79605 Pain in left leg: Secondary | ICD-10-CM

## 2023-10-04 DIAGNOSIS — Z471 Aftercare following joint replacement surgery: Secondary | ICD-10-CM | POA: Diagnosis not present

## 2023-10-04 DIAGNOSIS — Z96652 Presence of left artificial knee joint: Secondary | ICD-10-CM | POA: Diagnosis not present

## 2023-10-04 DIAGNOSIS — M79662 Pain in left lower leg: Secondary | ICD-10-CM | POA: Diagnosis not present

## 2023-10-04 DIAGNOSIS — M7732 Calcaneal spur, left foot: Secondary | ICD-10-CM | POA: Diagnosis not present

## 2023-10-04 MED ORDER — CYCLOBENZAPRINE HCL 5 MG PO TABS: 5.0000 mg | ORAL_TABLET | Freq: Three times a day (TID) | ORAL | 0 refills | Status: AC | PRN

## 2023-10-04 NOTE — Telephone Encounter (Signed)
 FYI to provider

## 2023-10-04 NOTE — Telephone Encounter (Signed)
 Scheduled at [facility] at [facility]. Please call pt if able to be seen in PCP office today instead.  FYI Only or Action Required?: Action required by provider: request for appointment.  Patient was last seen in primary care on 07/10/2023 by Melvin Pao, NP.  Called Nurse Triage reporting Triage.  Symptoms began several days ago.  Interventions attempted: OTC medications: ibuprofen.  Symptoms are: gradually worsening.  Triage Disposition: See HCP Within 4 Hours (Or PCP Triage)  Patient/caregiver understands and will follow disposition?: Yes Reason for Disposition  [1] SEVERE pain (e.g., excruciating, unable to do any normal activities) AND [2] not improved after 2 hours of pain medicine  Answer Assessment - Initial Assessment Questions Patient taking ibuprofen at night.   1. ONSET: When did the pain start?      4 days agp  2. LOCATION: Where is the pain located?      Left shin, right below knee  3. PAIN: How bad is the pain?    (Scale 1-10; or mild, moderate, severe)     Moderate/severe, has to use cane to walk  4. WORK OR EXERCISE: Has there been any recent work or exercise that involved this part of the body?      No  5. CAUSE: What do you think is causing the leg pain?     Unknown  Protocols used: Leg Pain-A-AH Copied from CRM [id number]. Topic: Clinical - Red Word Triage >> Oct 04, 2023  9:46 AM DeAngela L wrote: Red Word that prompted transfer to Nurse Triage: patient having trouble left leg is giving away when she is having really bad pain patient had total knee replacement and hip is hurting also, this has been a concern for the last four days   Patient takes antidepressants   Pt num [phone] (M)

## 2023-10-04 NOTE — Telephone Encounter (Signed)
 FYI Only or Action Required?: FYI only for provider.  Patient was last seen in primary care on 07/10/2023 by Melvin Pao, NP.  Called Nurse Triage reporting Leg Swelling.  Symptoms began several days ago.  Interventions attempted: Rest, hydration, or home remedies and Ice/heat application.  Symptoms are: gradually worsening.  Triage Disposition: See HCP Within 4 Hours (Or PCP Triage)  Patient/caregiver understands and will follow disposition?: Yes          Copied from CRM [id number]. Topic: Clinical - Red Word Triage >> Oct 04, 2023  1:07 PM Gustabo D wrote: Having problem with your leg lots of pain. 4 days and today is the worse day. Reason for Disposition  [1] Thigh or calf pain AND [2] only 1 side AND [3] present > 1 hour  Answer Assessment - Initial Assessment Questions No available appt with PCP. Recommended ED now .      1. ONSET: When did the swelling start? (e.g., minutes, hours, days)     4 days ago  2. LOCATION: What part of the leg is swollen?  Are both legs swollen or just one leg?     Left leg to knee 3. SEVERITY: How bad is the swelling? (e.g., localized; mild, moderate, severe)     Did not rate 4. REDNESS: Is there redness or signs of infection?     Varicose veins  5. PAIN: Is the swelling painful to touch? If Yes, ask: How painful is it?   (Scale 1-10; mild, moderate or severe)     severe 6. FEVER: Do you have a fever? If Yes, ask: What is it, how was it measured, and when did it start?      na 7. CAUSE: What do you think is causing the leg swelling?     Not sure  8. MEDICAL HISTORY: Do you have a history of blood clots (e.g., DVT), cancer, heart failure, kidney disease, or liver failure?     Na  9. RECURRENT SYMPTOM: Have you had leg swelling before? If Yes, ask: When was the last time? What happened that time?     S/p knee surgery  10. OTHER SYMPTOMS: Do you have any other symptoms? (e.g., chest pain, difficulty  breathing)       Throbbing feeling, calf area  varicose veins. Pain bearing weight on left leg 11. PREGNANCY: Is there any chance you are pregnant? When was your last menstrual period?       na  Protocols used: Leg Swelling and Edema-A-AH

## 2023-10-04 NOTE — ED Provider Notes (Signed)
 [facility] Emergency Department Provider Note     Event Date/Time   First MD Initiated Contact with Patient 10/04/23 1537     (approximate)   History   Leg Pain   HPI  Jill Burch is a 63 y.o. female with a history of obesity, anxiety, depression, OA, and 3 years status post left total knee arthroplasty, presents to the ED endorsing some left lower extremity pain.  Patient reports about 4 days of pain to the anterior inferior knee.  Worse with walking, described as near give way pain.  She denies any preceding injury, trauma, or falls.  Patient presents with a single-point cane which she has been use over the last few days for symptoms.  No cough, congestion, chest pain, or shortness of breath reported.  Patient denies a history of PE/DVT.   Physical Exam   Triage Vital Signs: ED Triage Vitals [10/04/23 1454]  Encounter Vitals Group     BP 127/67     Girls Systolic BP Percentile      Girls Diastolic BP Percentile      Boys Systolic BP Percentile      Boys Diastolic BP Percentile      Pulse Rate 71     Resp 18     Temp 97.9 F (36.6 C)     Temp Source Oral     SpO2 94 %     Weight 219 lb (99.3 kg)     Height 5' 2 (1.575 m)     Head Circumference      Peak Flow      Pain Score 10     Pain Loc      Pain Education      Exclude from Growth Chart     Most recent vital signs: Vitals:   10/04/23 1454 10/04/23 1801  BP: 127/67 132/76  Pulse: 71 (!) 58  Resp: 18 18  Temp: 97.9 F (36.6 C)   SpO2: 94% 99%    General Awake, no distress.  NAD HEENT NCAT. PERRL. EOMI. No rhinorrhea. Mucous membranes are moist.  CV:  Good peripheral perfusion.  RESP:  Normal effort.  MSK:  Bilateral lower extremities with moderate superficial varicosities and torturous varicose veins noted.  No joint effusion appreciated.  Well-healed left knee surgical scar.  Active range of motion noted.   ED Results / Procedures / Treatments   Labs (all labs ordered  are listed, but only abnormal results are displayed) Labs Reviewed - No data to display   EKG   RADIOLOGY  I personally viewed and evaluated these images as part of my medical decision making, as well as reviewing the written report by the radiologist.  ED Provider Interpretation: No acute fracture, dislocation, or hardware disruption  US  Venous Img Lower Unilateral Left Result Date: 10/04/2023 CLINICAL DATA:  LLE pain; history of varicose veins AND TKA EXAM: Left LOWER EXTREMITY VENOUS DOPPLER ULTRASOUND TECHNIQUE: Gray-scale sonography with compression, as well as color and duplex ultrasound, were performed to evaluate the deep venous system(s) from the level of the common femoral vein through the popliteal and proximal calf veins. COMPARISON:  None Available. FINDINGS: VENOUS Normal compressibility of the common femoral, superficial femoral, and popliteal veins, as well as the visualized calf veins. Visualized portions of profunda femoral vein and great saphenous vein unremarkable. No filling defects to suggest DVT on grayscale or color Doppler imaging. Doppler waveforms show normal direction of venous flow, normal respiratory plasticity and response to  augmentation. Limited views of the contralateral common femoral vein are unremarkable. OTHER None. Limitations: none IMPRESSION: No deep venous thrombosis of the left lower extremity Electronically Signed   By: Morgane  Naveau M.D.   On: 10/04/2023 17:49   DG Tibia/Fibula Left Result Date: 10/04/2023 CLINICAL DATA:  pain. EXAM: LEFT TIBIA AND FIBULA - 2 VIEW COMPARISON:  10/14/2020. FINDINGS: No acute fracture or dislocation. No aggressive osseous lesion. Patient is status post left total knee arthroplasty with patellar resurfacing. The hardware is intact. No periprosthetic fracture or lucency. There is near anatomic alignment. Ankle mortise appears intact. Calcaneal spur noted along the Achilles tendon and Plantar aponeurosis attachment sites. No  focal soft tissue swelling. No radiopaque foreign bodies. IMPRESSION: No acute osseous abnormality of the left tibia and fibula. Electronically Signed   By: Ree Molt M.D.   On: 10/04/2023 15:33     PROCEDURES:  Critical Care performed: No  Procedures   MEDICATIONS ORDERED IN ED: Medications - No data to display   IMPRESSION / MDM / ASSESSMENT AND PLAN / ED COURSE  I reviewed the triage vital signs and the nursing notes.                              Differential diagnosis includes, but is not limited to, hardware disruption, fracture or dislocation, DVT  Patient's presentation is most consistent with acute complicated illness / injury requiring diagnostic workup.  Patient's diagnosis is consistent with acute left leg pain.  Patient presents to the ED for evaluation of pain to the anterior leg inferior to the kneecap without provocation, injury or fall.  No cough or chest pain reported.  Her plain x-ray interpreted by me, shows no acute fracture or hardware disruption.  Ultrasound for the LLE, interpreted by me, shows no acute DVT.  Patient will be discharged home with prescriptions for cyclobenzaprine . Patient is to follow up with orthopedics as needed or otherwise directed. Patient is given ED precautions to return to the ED for any worsening or new symptoms.  FINAL CLINICAL IMPRESSION(S) / ED DIAGNOSES   Final diagnoses:  Left leg pain  Acute pain of left knee     Rx / DC Orders   ED Discharge Orders          Ordered    cyclobenzaprine  (FLEXERIL ) 5 MG tablet  3 times daily PRN        10/04/23 1804             Note:  This document was prepared using Dragon voice recognition software and may include unintentional dictation errors.    Loyd Candida LULLA Aldona, PA-C 10/04/23 1805    Arlander Charleston, MD 10/04/23 [phone]

## 2023-10-04 NOTE — ED Triage Notes (Signed)
 Patient to ED via POV for left lower leg pain. Ongoing x4 days with no injury. States hx of knee replacement on left knee. Ambulatory with cane.

## 2023-10-04 NOTE — Discharge Instructions (Signed)
 Your exam, x-ray, and ultrasound are normal and reassuring at this time with no signs of any disruption to your hardware, or DVT to the left leg.  Follow-up with your primary provider or orthopedics for ongoing evaluation.  Take OTC Tylenol  or ibuprofen as needed.

## 2023-10-05 ENCOUNTER — Other Ambulatory Visit: Payer: Self-pay | Admitting: Nurse Practitioner

## 2023-10-05 ENCOUNTER — Telehealth: Payer: Self-pay

## 2023-10-05 NOTE — Telephone Encounter (Signed)
 Copied from CRM [id number]. Topic: General - Other >> Oct 04, 2023 10:20 AM Sophia H wrote: Reason for CRM: Patient is requesting to speak with nurse at clinic, does not want to speak with NT regarding knee pain. Please reach out today, Ty. # [phone]

## 2023-10-07 NOTE — Telephone Encounter (Signed)
 Too soon for refill.  Requested Prescriptions  Pending Prescriptions Disp Refills   rosuvastatin  (CRESTOR ) 40 MG tablet [Pharmacy Med Name: ROSUVASTATIN  CALCIUM  40 MG Oral Tablet] 90 tablet 3    Sig: TAKE 1 TABLET EVERY DAY     Cardiovascular:  Antilipid - Statins 2 Failed - 10/07/2023 11:10 AM      Failed - Lipid Panel in normal range within the last 12 months    Cholesterol, Total  Date Value Ref Range Status  07/10/2023 169 100 - 199 mg/dL Final   LDL Chol Calc (NIH)  Date Value Ref Range Status  07/10/2023 88 0 - 99 mg/dL Final   HDL  Date Value Ref Range Status  07/10/2023 52 >39 mg/dL Final   Triglycerides  Date Value Ref Range Status  07/10/2023 169 (H) 0 - 149 mg/dL Final         Passed - Cr in normal range and within 360 days    Creatinine, Ser  Date Value Ref Range Status  07/10/2023 0.81 0.57 - 1.00 mg/dL Final         Passed - Patient is not pregnant      Passed - Valid encounter within last 12 months    Recent Outpatient Visits           2 months ago Obesity, morbid (HCC)   [facility] [place], Darice, NP   4 months ago Allergic contact dermatitis due to plants, except food   [facility] [place], Megan P, DO               rOPINIRole  (REQUIP ) 0.5 MG tablet [Pharmacy Med Name: ROPINIROLE  HYDROCHLORIDE 0.5 MG Oral Tablet] 90 tablet 3    Sig: TAKE 1 TABLET AT BEDTIME     Neurology:  Parkinsonian Agents Passed - 10/07/2023 11:10 AM      Passed - Last BP in normal range    BP Readings from Last 1 Encounters:  10/04/23 132/76         Passed - Last Heart Rate in normal range    Pulse Readings from Last 1 Encounters:  10/04/23 (!) 58         Passed - Valid encounter within last 12 months    Recent Outpatient Visits           2 months ago Obesity, morbid (HCC)   [facility] [place]ice, NP   4 months ago Allergic contact dermatitis due to plants, except food    [facility] [place], [place], DO

## 2023-12-26 ENCOUNTER — Other Ambulatory Visit: Payer: Self-pay | Admitting: Nurse Practitioner

## 2023-12-28 NOTE — Telephone Encounter (Signed)
 Requested Prescriptions  Pending Prescriptions Disp Refills   rosuvastatin  (CRESTOR ) 40 MG tablet [Pharmacy Med Name: ROSUVASTATIN  CALCIUM  40 MG Oral Tablet] 90 tablet 1    Sig: TAKE 1 TABLET EVERY DAY     Cardiovascular:  Antilipid - Statins 2 Failed - 12/28/2023 12:15 PM      Failed - Lipid Panel in normal range within the last 12 months    Cholesterol, Total  Date Value Ref Range Status  07/10/2023 169 100 - 199 mg/dL Final   LDL Chol Calc (NIH)  Date Value Ref Range Status  07/10/2023 88 0 - 99 mg/dL Final   HDL  Date Value Ref Range Status  07/10/2023 52 >39 mg/dL Final   Triglycerides  Date Value Ref Range Status  07/10/2023 169 (H) 0 - 149 mg/dL Final         Passed - Cr in normal range and within 360 days    Creatinine, Ser  Date Value Ref Range Status  07/10/2023 0.81 0.57 - 1.00 mg/dL Final         Passed - Patient is not pregnant      Passed - Valid encounter within last 12 months    Recent Outpatient Visits           5 months ago Obesity, morbid (HCC)   [facility] [place] Pao, NP   7 months ago Allergic contact dermatitis due to plants, except food    [facility] [place], [place], DO

## 2024-01-14 ENCOUNTER — Encounter: Payer: Self-pay | Admitting: Nurse Practitioner

## 2024-01-14 ENCOUNTER — Ambulatory Visit: Admitting: Nurse Practitioner

## 2024-01-14 VITALS — BP 136/79 | HR 61 | Temp 97.8°F | Ht 62.8 in | Wt 214.6 lb

## 2024-01-14 DIAGNOSIS — E785 Hyperlipidemia, unspecified: Secondary | ICD-10-CM | POA: Diagnosis not present

## 2024-01-14 DIAGNOSIS — Z780 Asymptomatic menopausal state: Secondary | ICD-10-CM

## 2024-01-14 DIAGNOSIS — F32A Depression, unspecified: Secondary | ICD-10-CM

## 2024-01-14 DIAGNOSIS — Z0000 Encounter for general adult medical examination without abnormal findings: Secondary | ICD-10-CM | POA: Diagnosis not present

## 2024-01-14 DIAGNOSIS — Z1231 Encounter for screening mammogram for malignant neoplasm of breast: Secondary | ICD-10-CM | POA: Diagnosis not present

## 2024-01-14 DIAGNOSIS — F322 Major depressive disorder, single episode, severe without psychotic features: Secondary | ICD-10-CM | POA: Insufficient documentation

## 2024-01-14 DIAGNOSIS — R7303 Prediabetes: Secondary | ICD-10-CM | POA: Diagnosis not present

## 2024-01-14 MED ORDER — ROSUVASTATIN CALCIUM 40 MG PO TABS: 40.0000 mg | ORAL_TABLET | Freq: Every day | ORAL | 1 refills | Status: DC

## 2024-01-14 MED ORDER — VENLAFAXINE HCL ER 75 MG PO CP24: 75.0000 mg | ORAL_CAPSULE | Freq: Every day | ORAL | 1 refills | Status: AC

## 2024-01-14 MED ORDER — ROPINIROLE HCL 0.5 MG PO TABS: 0.5000 mg | ORAL_TABLET | Freq: Every day | ORAL | 1 refills | Status: DC

## 2024-01-14 NOTE — Assessment & Plan Note (Signed)
 Recommended eating smaller high protein, low fat meals more frequently and exercising 30 mins a day 5 times a week with a goal of 10-15lb weight loss in the next 3 months.

## 2024-01-14 NOTE — Progress Notes (Signed)
 BP 136/79 (BP Location: Right Arm, Cuff Size: Large)   Pulse 61   Temp 97.8 F (36.6 C) (Oral)   Ht 5' 2.8 (1.595 m)   Wt 214 lb 9.6 oz (97.3 kg)   SpO2 99%   BMI 38.26 kg/m    Subjective:    Patient ID: Ellouise LITTIE Solo, female    DOB: 05/03/1960, 63 y.o.   MRN: [id number]  HPI: Jill Burch is a 63 y.o. female presenting on 01/14/2024 for comprehensive medical examination. Current medical complaints include:none  She currently lives with: Menopausal Symptoms: no  DEPRESSION/ANXIETY Patient states her depression has been okay.  She has only been taking 150mg  of Venlafaxine  but feels like she needs the additional dose of 75mg .  It has been worse recently with the shorter days.  Denies SI. She is having passive thoughts but no active thoughts or plan.   RESTLESS LEGS Duration: years Discomfort description:  burning Pain: yes Location: lower legs Bilateral: yes Symmetric: yes Severity: moderate Onset:  gradual Frequency:  constant LE numbness: yes Decreased sensation: no Weakness: yes Insomnia: yes Daytime somnolence: no   HYPERLIPIDEMIA Hyperlipidemia status: excellent compliance Satisfied with current treatment?  no Side effects:  no Medication compliance: excellent compliance Past cholesterol meds: rosuvastatin  (crestor ) and lovaza Supplements: none Aspirin:  no The 10-year ASCVD risk score (Arnett DK, et al., 2019) is: 5%   Values used to calculate the score:     Age: 71 years     Sex: Female     Is Non-Hispanic African American: No     Diabetic: No     Tobacco smoker: No     Systolic Blood Pressure: 131 mmHg     Is BP treated: No     HDL Cholesterol: 52 mg/dL     Total Cholesterol: 176 mg/dL Chest pain:  no Coronary artery disease:  no Family history CAD:  no Family history early CAD:  no  Denies HA, CP, SOB, dizziness, palpitations, visual changes, and lower extremity swelling.  Depression Screen done today and results listed below:     01/14/2024     2:51 PM 07/10/2023    3:11 PM 06/14/2023    2:45 PM 06/01/2023    9:56 AM 01/09/2023    3:15 PM  Depression screen PHQ 2/9  Decreased Interest 2 0 1 1 2   Down, Depressed, Hopeless 2 0 0 1 2  PHQ - 2 Score 4 0 1 2 4   Altered sleeping 3 0 3 3 3   Tired, decreased energy 3 0 1 1 2   Change in appetite 3 0   2  Feeling bad or failure about yourself  3 0 0 2 0  Trouble concentrating 3 0 0 1 1  Moving slowly or fidgety/restless 0 0 0 0 0  Suicidal thoughts 2 0 0 0 0  PHQ-9 Score 21 0  5  9  12    Difficult doing work/chores Very difficult  Not difficult at all       Data saved with a previous flowsheet row definition    The patient does not have a history of falls. I did complete a risk assessment for falls. A plan of care for falls was documented.   Past Medical History:  Past Medical History:  Diagnosis Date   Anxiety    Depression    Hyperlipidemia    Osteoarthritis    PONV (postoperative nausea and vomiting)    Pre-diabetes     Surgical History:  Past Surgical  History:  Procedure Laterality Date   APPLICATION OF WOUND VAC  10/14/2020   Procedure: APPLICATION OF WOUND VAC;  Surgeon: Edie Norleen PARAS, MD;  Location: [facility] ORS;  Service: Orthopedics;;   COLONOSCOPY WITH PROPOFOL  N/A 10/13/2021   Procedure: COLONOSCOPY WITH PROPOFOL ;  Surgeon: Therisa Bi, MD;  Location: [facility] ENDOSCOPY;  Service: Gastroenterology;  Laterality: N/A;   TOTAL KNEE ARTHROPLASTY Left 10/14/2020   Procedure: TOTAL KNEE ARTHROPLASTY;  Surgeon: Edie Norleen PARAS, MD;  Location: [facility] ORS;  Service: Orthopedics;  Laterality: Left;   TUBAL LIGATION  1995    Medications:  Current Outpatient Medications on File Prior to Visit  Medication Sig   acetaminophen  (TYLENOL ) 500 MG tablet Take 2 tablets (1,000 mg total) by mouth every 6 (six) hours as needed.   aspirin 81 MG EC tablet Take 81 mg by mouth daily. Swallow whole.   Beta Carotene (VITAMIN A) 25000 UNIT capsule Take 25,000 Units by mouth daily.   cyclobenzaprine   (FLEXERIL ) 5 MG tablet Take 1 tablet (5 mg total) by mouth 3 (three) times daily as needed.   diclofenac  Sodium (VOLTAREN ) 1 % GEL Apply 2 g topically 4 (four) times daily as needed.   Homeopathic Products (ARNICARE) GEL Apply topically as needed.   Lidocaine  HCl (ASPERCREME LIDOCAINE ) 4 % LIQD Apply topically as needed.   Multiple Vitamins-Minerals (ADULT GUMMY PO) Take 2 capsules by mouth daily.   venlafaxine  XR (EFFEXOR -XR) 150 MG 24 hr capsule Take 1 capsule with breakast   Vitamin E 450 MG (1000 UT) CAPS Take by mouth.   No current facility-administered medications on file prior to visit.    Allergies:  Allergies  Allergen Reactions   Lactose Diarrhea    Bloating, gas    Lactose Intolerance (Gi) Diarrhea    Bloating, gas    Poison Ivy Extract Hives and Swelling   Poison Oak Extract Hives and Swelling   Sumac Hives and Swelling    Social History:  Social History   Socioeconomic History   Marital status: Married    Spouse name: Christopher   Number of children: 1   Years of education: Not on file   Highest education level: Not on file  Occupational History   Not on file  Tobacco Use   Smoking status: Former    Current packs/day: 0.00    Average packs/day: 0.1 packs/day for 16.0 years (1.6 ttl pk-yrs)    Types: Cigarettes    Start date: 62    Quit date: 02/06/1990    Years since quitting: 33.9    Passive exposure: Past   Smokeless tobacco: Never  Vaping Use   Vaping status: Never Used  Substance and Sexual Activity   Alcohol  use: No    Alcohol /week: 0.0 standard drinks of alcohol    Drug use: No   Sexual activity: Not Currently  Other Topics Concern   Not on file  Social History Narrative   Live at home with husband.   Social Drivers of Corporate Investment Banker Strain: Low Risk  (06/14/2023)   Overall Financial Resource Strain (CARDIA)    Difficulty of Paying Living Expenses: Not hard at all  Food Insecurity: No Food Insecurity (06/14/2023)   Hunger Vital Sign     Worried About Running Out of Food in the Last Year: Never true    Ran Out of Food in the Last Year: Never true  Transportation Needs: No Transportation Needs (06/14/2023)   PRAPARE - Administrator, Civil Service (Medical): No  Lack of Transportation (Non-Medical): No  Physical Activity: Inactive (06/14/2023)   Exercise Vital Sign    Days of Exercise per Week: 0 days    Minutes of Exercise per Session: 0 min  Stress: No Stress Concern Present (06/14/2023)   [employer] of Occupational Health - Occupational Stress Questionnaire    Feeling of Stress : Only a little  Social Connections: Moderately Integrated (06/14/2023)   Social Connection and Isolation Panel    Frequency of Communication with Friends and Family: More than three times a week    Frequency of Social Gatherings with Friends and Family: Three times a week    Attends Religious Services: More than 4 times per year    Active Member of Clubs or Organizations: No    Attends Banker Meetings: Never    Marital Status: Married  Catering Manager Violence: Not At Risk (06/14/2023)   Humiliation, Afraid, Rape, and Kick questionnaire    Fear of Current or Ex-Partner: No    Emotionally Abused: No    Physically Abused: No    Sexually Abused: No   Social History   Tobacco Use  Smoking Status Former   Current packs/day: 0.00   Average packs/day: 0.1 packs/day for 16.0 years (1.6 ttl pk-yrs)   Types: Cigarettes   Start date: 46   Quit date: 02/06/1990   Years since quitting: 33.9   Passive exposure: Past  Smokeless Tobacco Never   Social History   Substance and Sexual Activity  Alcohol  Use No   Alcohol /week: 0.0 standard drinks of alcohol     Family History:  Family History  Problem Relation Age of Onset   Depression Mother    Arthritis Mother    Heart disease Mother    Heart attack Mother    Cancer Father        bladder   Diabetes Father    Depression Father    Hypertension Father     Diabetes Brother    COPD Neg Hx    Stroke Neg Hx     Past medical history, surgical history, medications, allergies, family history and social history reviewed with patient today and changes made to appropriate areas of the chart.   Review of Systems  Eyes:  Negative for blurred vision and double vision.  Respiratory:  Negative for shortness of breath.   Cardiovascular:  Negative for chest pain, palpitations and leg swelling.  Neurological:  Negative for dizziness and headaches.  Psychiatric/Behavioral:  Positive for depression. Negative for suicidal ideas. The patient is nervous/anxious.    All other ROS negative except what is listed above and in the HPI.      Objective:    BP 136/79 (BP Location: Right Arm, Cuff Size: Large)   Pulse 61   Temp 97.8 F (36.6 C) (Oral)   Ht 5' 2.8 (1.595 m)   Wt 214 lb 9.6 oz (97.3 kg)   SpO2 99%   BMI 38.26 kg/m   Wt Readings from Last 3 Encounters:  01/14/24 214 lb 9.6 oz (97.3 kg)  10/04/23 219 lb (99.3 kg)  07/10/23 215 lb (97.5 kg)    Physical Exam Vitals and nursing note reviewed.  Constitutional:      General: She is awake. She is not in acute distress.    Appearance: Normal appearance. She is well-developed. She is obese. She is not ill-appearing.  HENT:     Head: Normocephalic and atraumatic.     Right Ear: Hearing, tympanic membrane, ear canal and external ear normal.  No drainage.     Left Ear: Hearing, tympanic membrane, ear canal and external ear normal. No drainage.     Nose: Nose normal.     Right Sinus: No maxillary sinus tenderness or frontal sinus tenderness.     Left Sinus: No maxillary sinus tenderness or frontal sinus tenderness.     Mouth/Throat:     Mouth: Mucous membranes are moist.     Pharynx: Oropharynx is clear. Uvula midline. No pharyngeal swelling, oropharyngeal exudate or posterior oropharyngeal erythema.  Eyes:     General: Lids are normal.        Right eye: No discharge.        Left eye: No  discharge.     Extraocular Movements: Extraocular movements intact.     Conjunctiva/sclera: Conjunctivae normal.     Pupils: Pupils are equal, round, and reactive to light.     Visual Fields: Right eye visual fields normal and left eye visual fields normal.  Neck:     Thyroid : No thyromegaly.     Vascular: No carotid bruit.     Trachea: Trachea normal.  Cardiovascular:     Rate and Rhythm: Normal rate and regular rhythm.     Heart sounds: Normal heart sounds. No murmur heard.    No gallop.  Pulmonary:     Effort: Pulmonary effort is normal. No accessory muscle usage or respiratory distress.     Breath sounds: Normal breath sounds.  Chest:  Breasts:    Right: Normal.     Left: Normal.  Abdominal:     General: Bowel sounds are normal.     Palpations: Abdomen is soft. There is no hepatomegaly or splenomegaly.     Tenderness: There is no abdominal tenderness.  Musculoskeletal:        General: Normal range of motion.     Cervical back: Normal range of motion and neck supple.     Right lower leg: No edema.     Left lower leg: No edema.  Lymphadenopathy:     Head:     Right side of head: No submental, submandibular, tonsillar, preauricular or posterior auricular adenopathy.     Left side of head: No submental, submandibular, tonsillar, preauricular or posterior auricular adenopathy.     Cervical: No cervical adenopathy.     Upper Body:     Right upper body: No supraclavicular, axillary or pectoral adenopathy.     Left upper body: No supraclavicular, axillary or pectoral adenopathy.  Skin:    General: Skin is warm and dry.     Capillary Refill: Capillary refill takes less than 2 seconds.     Findings: No rash.  Neurological:     Mental Status: She is alert and oriented to person, place, and time.     Gait: Gait is intact.  Psychiatric:        Attention and Perception: Attention normal.        Mood and Affect: Mood normal.        Speech: Speech normal.        Behavior: Behavior  normal. Behavior is cooperative.        Thought Content: Thought content normal.        Judgment: Judgment normal.     Results for orders placed or performed in visit on 07/10/23  Comp Met (CMET)   Collection Time: 07/10/23  3:36 PM  Result Value Ref Range   Glucose 80 70 - 99 mg/dL   BUN 19 8 - 27 mg/dL  Creatinine, Ser 0.81 0.57 - 1.00 mg/dL   eGFR 81 >40 fO/fpw/8.26   BUN/Creatinine Ratio 23 12 - 28   Sodium 143 134 - 144 mmol/L   Potassium 4.5 3.5 - 5.2 mmol/L   Chloride 103 96 - 106 mmol/L   CO2 24 20 - 29 mmol/L   Calcium  9.5 8.7 - 10.3 mg/dL   Total Protein 6.7 6.0 - 8.5 g/dL   Albumin 4.5 3.9 - 4.9 g/dL   Globulin, Total 2.2 1.5 - 4.5 g/dL   Bilirubin Total <9.7 0.0 - 1.2 mg/dL   Alkaline Phosphatase 71 44 - 121 IU/L   AST 20 0 - 40 IU/L   ALT 19 0 - 32 IU/L  HgB A1c   Collection Time: 07/10/23  3:36 PM  Result Value Ref Range   Hgb A1c MFr Bld 5.9 (H) 4.8 - 5.6 %   Est. average glucose Bld gHb Est-mCnc 123 mg/dL  Lipid Profile   Collection Time: 07/10/23  3:36 PM  Result Value Ref Range   Cholesterol, Total 169 100 - 199 mg/dL   Triglycerides 830 (H) 0 - 149 mg/dL   HDL 52 >60 mg/dL   VLDL Cholesterol Cal 29 5 - 40 mg/dL   LDL Chol Calc (NIH) 88 0 - 99 mg/dL   Chol/HDL Ratio 3.3 0.0 - 4.4 ratio      Assessment & Plan:   Problem List Items Addressed This Visit       Other   Hyperlipidemia   Chronic.  Controlled.  Continue with current medication regimen of rosuvastatin  40mg  daily.  Refills sent today.  Labs ordered today.  Return to clinic in 6 months for reevaluation.  Call sooner if concerns arise.       Relevant Medications   rosuvastatin  (CRESTOR ) 40 MG tablet   Other Relevant Orders   Lipid panel   Pre-diabetes   Chronic.  Controlled.  Continue with current medication regimen.  Labs ordered today.  Return to clinic in 6 months for reevaluation.  Call sooner if concerns arise.        Relevant Orders   Hemoglobin A1c   Obesity, morbid (HCC)    Recommended eating smaller high protein, low fat meals more frequently and exercising 30 mins a day 5 times a week with a goal of 10-15lb weight loss in the next 3 months.       Severe major depressive disorder (HCC)   Chronic. Not well controlled due to shorter days and holidays.  Patient plans to increase her venlafaxine  back to 225mg  daily.  She typically does this every year about this time.  Follow up if not improved.      Relevant Medications   venlafaxine  XR (EFFEXOR -XR) 75 MG 24 hr capsule   RESOLVED: Depression   Relevant Medications   venlafaxine  XR (EFFEXOR -XR) 75 MG 24 hr capsule   Other Visit Diagnoses       Annual physical exam    -  Primary   Health maintenance reviewed during visit today.  Labs ordered.  Vaccines reviewed.  Mammogram and Bone Density ordered.   Relevant Orders   CBC with Differential/Platelet   Comprehensive metabolic panel with GFR   Lipid panel   TSH   Hemoglobin A1c     Post-menopausal       Relevant Orders   DG Bone Density     Encounter for screening mammogram for malignant neoplasm of breast       Relevant Orders   MM 3D SCREENING MAMMOGRAM  BILATERAL BREAST        Follow up plan: No follow-ups on file.   LABORATORY TESTING:  - Pap smear: up to date  IMMUNIZATIONS:   - Tdap: Tetanus vaccination status reviewed: last tetanus booster within 10 years. - Influenza: Up to date - Pneumovax: Up to date - Prevnar: Up to date - COVID: Not applicable - HPV: Not applicable - Shingrix vaccine: Up to date  SCREENING: -Mammogram: needs updated- but doesn't driver  - Colonoscopy: Up to date  - Bone Density: Not applicable  -Hearing Test: Not applicable  -Spirometry: Not applicable   PATIENT COUNSELING:   Advised to take 1 mg of folate supplement per day if capable of pregnancy.   Sexuality: Discussed sexually transmitted diseases, partner selection, use of condoms, avoidance of unintended pregnancy  and contraceptive alternatives.    Advised to avoid cigarette smoking.  I discussed with the patient that most people either abstain from alcohol  or drink within safe limits (<=14/week and <=4 drinks/occasion for males, <=7/weeks and <= 3 drinks/occasion for females) and that the risk for alcohol  disorders and other health effects rises proportionally with the number of drinks per week and how often a drinker exceeds daily limits.  Discussed cessation/primary prevention of drug use and availability of treatment for abuse.   Diet: Encouraged to adjust caloric intake to maintain  or achieve ideal body weight, to reduce intake of dietary saturated fat and total fat, to limit sodium intake by avoiding high sodium foods and not adding table salt, and to maintain adequate dietary potassium and calcium  preferably from fresh fruits, vegetables, and low-fat dairy products.    stressed the importance of regular exercise  Injury prevention: Discussed safety belts, safety helmets, smoke detector, smoking near bedding or upholstery.   Dental health: Discussed importance of regular tooth brushing, flossing, and dental visits.    NEXT PREVENTATIVE PHYSICAL DUE IN 1 YEAR. No follow-ups on file.

## 2024-01-14 NOTE — Assessment & Plan Note (Signed)
 Chronic.  Controlled.  Continue with current medication regimen.  Labs ordered today.  Return to clinic in 6 months for reevaluation.  Call sooner if concerns arise.  ? ?

## 2024-01-14 NOTE — Assessment & Plan Note (Signed)
 Chronic.  Controlled.  Continue with current medication regimen of rosuvastatin  40mg  daily.  Refills sent today.  Labs ordered today.  Return to clinic in 6 months for reevaluation.  Call sooner if concerns arise.

## 2024-01-14 NOTE — Assessment & Plan Note (Signed)
 Chronic. Not well controlled due to shorter days and holidays.  Patient plans to increase her venlafaxine  back to 225mg  daily.  She typically does this every year about this time.  Follow up if not improved.

## 2024-01-15 ENCOUNTER — Ambulatory Visit: Payer: Self-pay | Admitting: Nurse Practitioner

## 2024-01-15 LAB — CBC WITH DIFFERENTIAL/PLATELET
Basophils Absolute: 0 x10E3/uL (ref 0.0–0.2)
Basos: 0 %
EOS (ABSOLUTE): 0.1 x10E3/uL (ref 0.0–0.4)
Eos: 1 %
Hematocrit: 39.3 % (ref 34.0–46.6)
Hemoglobin: 13 g/dL (ref 11.1–15.9)
Immature Grans (Abs): 0 x10E3/uL (ref 0.0–0.1)
Immature Granulocytes: 0 %
Lymphocytes Absolute: 2.1 x10E3/uL (ref 0.7–3.1)
Lymphs: 28 %
MCH: 30.2 pg (ref 26.6–33.0)
MCHC: 33.1 g/dL (ref 31.5–35.7)
MCV: 91 fL (ref 79–97)
Monocytes Absolute: 0.4 x10E3/uL (ref 0.1–0.9)
Monocytes: 5 %
Neutrophils Absolute: 5.1 x10E3/uL (ref 1.4–7.0)
Neutrophils: 66 %
Platelets: 328 x10E3/uL (ref 150–450)
RBC: 4.31 x10E6/uL (ref 3.77–5.28)
RDW: 12.7 % (ref 11.7–15.4)
WBC: 7.8 x10E3/uL (ref 3.4–10.8)

## 2024-01-15 LAB — LIPID PANEL
Chol/HDL Ratio: 3.5 ratio (ref 0.0–4.4)
Cholesterol, Total: 170 mg/dL (ref 100–199)
HDL: 49 mg/dL (ref >39)
LDL Chol Calc (NIH): 98 mg/dL (ref 0–99)
Triglycerides: 128 mg/dL (ref 0–149)
VLDL Cholesterol Cal: 23 mg/dL (ref 5–40)

## 2024-01-15 LAB — COMPREHENSIVE METABOLIC PANEL WITH GFR
ALT: 20 IU/L (ref 0–32)
AST: 23 IU/L (ref 0–40)
Albumin: 4.6 g/dL (ref 3.9–4.9)
Alkaline Phosphatase: 79 IU/L (ref 49–135)
BUN/Creatinine Ratio: 17 (ref 12–28)
BUN: 12 mg/dL (ref 8–27)
Bilirubin Total: <0.2 mg/dL (ref 0.0–1.2)
CO2: 27 mmol/L (ref 20–29)
Calcium: 9.8 mg/dL (ref 8.7–10.3)
Chloride: 101 mmol/L (ref 96–106)
Creatinine, Ser: 0.7 mg/dL (ref 0.57–1.00)
Globulin, Total: 2.5 g/dL (ref 1.5–4.5)
Glucose: 87 mg/dL (ref 70–99)
Potassium: 4.3 mmol/L (ref 3.5–5.2)
Sodium: 142 mmol/L (ref 134–144)
Total Protein: 7.1 g/dL (ref 6.0–8.5)
eGFR: 96 mL/min/1.73 (ref >59)

## 2024-01-15 LAB — HEMOGLOBIN A1C
Est. average glucose Bld gHb Est-mCnc: 126 mg/dL
Hgb A1c MFr Bld: 6 % — ABNORMAL HIGH (ref 4.8–5.6)

## 2024-01-15 LAB — TSH: TSH: 1.65 u[IU]/mL (ref 0.450–4.500)

## 2024-02-20 ENCOUNTER — Other Ambulatory Visit: Payer: Self-pay

## 2024-02-20 MED ORDER — VENLAFAXINE HCL ER 150 MG PO CP24: ORAL_CAPSULE | ORAL | 1 refills | Status: DC

## 2024-02-20 NOTE — Progress Notes (Signed)
 Only the effexor  150mg  is due for refill.  Removed other orders, they were sent 1 month ago.

## 2024-02-22 ENCOUNTER — Telehealth: Payer: Self-pay

## 2024-02-22 NOTE — Telephone Encounter (Signed)
 I sent one of them on 1/14 and the other ones in December.  Please call the pharmacy and find out if they have updated refills that I have already sent.

## 2024-02-22 NOTE — Telephone Encounter (Signed)
 Contacted [facility] and confirmed that all 3 medications are on file with refills.

## 2024-02-22 NOTE — Telephone Encounter (Signed)
 Yes she is taking both doses.  She has been on this regimen for several years.

## 2024-03-03 ENCOUNTER — Other Ambulatory Visit: Payer: Self-pay | Admitting: Nurse Practitioner

## 2024-03-03 NOTE — Telephone Encounter (Unsigned)
 Copied from CRM #[id number]. Topic: Clinical - Medication Refill >> Mar 03, 2024  5:09 PM Zebedee SAUNDERS wrote: Medication: venlafaxine  XR (EFFEXOR -XR) 150 MG 24 hr capsule, rosuvastatin  (CRESTOR ) 40 MG tablet, rOPINIRole  (REQUIP ) 0.5 MG tablet  Has the patient contacted their pharmacy? Yes (Agent: If no, request that the patient contact the pharmacy for the refill. If patient does not wish to contact the pharmacy document the reason why and proceed with request.) (Agent: If yes, when and what did the pharmacy advise?)Pharmacy need scripts  This is the patient's preferred pharmacy:   [facility] [place], [place] - [address] [place] [address] Phone: [phone] Fax: [phone]  Is this the correct pharmacy for this prescription? Yes If no, delete pharmacy and type the correct one.   Has the prescription been filled recently? No  Is the patient out of the medication? Yes  Has the patient been seen for an appointment in the last year OR does the patient have an upcoming appointment? Yes  Can we respond through MyChart? Yes  Agent: Please be advised that Rx refills may take up to 3 business days. We ask that you follow-up with your pharmacy.

## 2024-03-04 MED ORDER — ROSUVASTATIN CALCIUM 40 MG PO TABS: 40.0000 mg | ORAL_TABLET | Freq: Every day | ORAL | 1 refills | Status: AC

## 2024-03-04 MED ORDER — ROPINIROLE HCL 0.5 MG PO TABS: 0.5000 mg | ORAL_TABLET | Freq: Every day | ORAL | 0 refills | Status: AC

## 2024-03-04 MED ORDER — VENLAFAXINE HCL ER 150 MG PO CP24: ORAL_CAPSULE | ORAL | 1 refills | Status: AC

## 2024-03-04 NOTE — Telephone Encounter (Signed)
 Change in pharmacy- will forward Rx Requested Prescriptions  Pending Prescriptions Disp Refills   venlafaxine  XR (EFFEXOR -XR) 150 MG 24 hr capsule 90 capsule 1    Sig: Take 1 capsule with breakast     Psychiatry: Antidepressants - SNRI - desvenlafaxine & venlafaxine  Failed - 03/04/2024  3:46 PM      Failed - Lipid Panel in normal range within the last 12 months    Cholesterol, Total  Date Value Ref Range Status  01/14/2024 170 100 - 199 mg/dL Final   LDL Chol Calc (NIH)  Date Value Ref Range Status  01/14/2024 98 0 - 99 mg/dL Final   HDL  Date Value Ref Range Status  01/14/2024 49 >39 mg/dL Final   Triglycerides  Date Value Ref Range Status  01/14/2024 128 0 - 149 mg/dL Final         Passed - Cr in normal range and within 360 days    Creatinine, Ser  Date Value Ref Range Status  01/14/2024 0.70 0.57 - 1.00 mg/dL Final         Passed - Completed PHQ-2 or PHQ-9 in the last 360 days      Passed - Last BP in normal range    BP Readings from Last 1 Encounters:  01/14/24 136/79         Passed - Valid encounter within last 6 months    Recent Outpatient Visits           1 month ago Annual physical exam   [place] [facility] Melvin Pao, NP   7 months ago Obesity, morbid [facility])   [place] [facility] Melvin Pao, NP   9 months ago Allergic contact dermatitis due to plants, except food   [place] [facility] [place], Megan P, DO               rOPINIRole  (REQUIP ) 0.5 MG tablet 90 tablet 1    Sig: Take 1 tablet (0.5 mg total) by mouth at bedtime.     Neurology:  Parkinsonian Agents Passed - 03/04/2024  3:46 PM      Passed - Last BP in normal range    BP Readings from Last 1 Encounters:  01/14/24 136/79         Passed - Last Heart Rate in normal range    Pulse Readings from Last 1 Encounters:  01/14/24 61         Passed - Valid encounter within last 12 months    Recent Outpatient Visits            1 month ago Annual physical exam   [place] [facility] Melvin Pao, NP   7 months ago Obesity, morbid [facility])   [place] [facility] Melvin Pao, NP   9 months ago Allergic contact dermatitis due to plants, except food    [facility] [place], Megan P, DO               rosuvastatin  (CRESTOR ) 40 MG tablet 90 tablet 1    Sig: Take 1 tablet (40 mg total) by mouth daily.     Cardiovascular:  Antilipid - Statins 2 Failed - 03/04/2024  3:46 PM      Failed - Lipid Panel in normal range within the last 12 months    Cholesterol, Total  Date Value Ref Range Status  01/14/2024 170 100 - 199 mg/dL Final   LDL Chol Calc (NIH)  Date Value  Ref Range Status  01/14/2024 98 0 - 99 mg/dL Final   HDL  Date Value Ref Range Status  01/14/2024 49 >39 mg/dL Final   Triglycerides  Date Value Ref Range Status  01/14/2024 128 0 - 149 mg/dL Final         Passed - Cr in normal range and within 360 days    Creatinine, Ser  Date Value Ref Range Status  01/14/2024 0.70 0.57 - 1.00 mg/dL Final         Passed - Patient is not pregnant      Passed - Valid encounter within last 12 months    Recent Outpatient Visits           1 month ago Annual physical exam   [place] [facility] Melvin Pao, NP   7 months ago Obesity, morbid [facility])   [place] [facility] Melvin Pao, NP   9 months ago Allergic contact dermatitis due to plants, except food   [place] [facility] [place], Paac [place], DO

## 2024-04-08 ENCOUNTER — Ambulatory Visit (INDEPENDENT_AMBULATORY_CARE_PROVIDER_SITE_OTHER): Admitting: Vascular Surgery

## 2024-06-26 ENCOUNTER — Ambulatory Visit

## 2024-07-14 ENCOUNTER — Ambulatory Visit: Admitting: Nurse Practitioner
